# Patient Record
Sex: Female | Born: 1985 | Race: Black or African American | Hispanic: No | Marital: Single | Smoking: Former smoker
Health system: Southern US, Community
[De-identification: ages and names within clinical notes are randomized; demographics above are authoritative.]

## PROBLEM LIST (undated history)

## (undated) ENCOUNTER — Inpatient Hospital Stay (HOSPITAL_COMMUNITY): Payer: Self-pay

## (undated) DIAGNOSIS — D649 Anemia, unspecified: Secondary | ICD-10-CM

## (undated) DIAGNOSIS — F419 Anxiety disorder, unspecified: Secondary | ICD-10-CM

## (undated) DIAGNOSIS — F32A Depression, unspecified: Secondary | ICD-10-CM

## (undated) DIAGNOSIS — I608 Other nontraumatic subarachnoid hemorrhage: Secondary | ICD-10-CM

## (undated) DIAGNOSIS — F329 Major depressive disorder, single episode, unspecified: Secondary | ICD-10-CM

## (undated) HISTORY — DX: Anxiety disorder, unspecified: F41.9

---
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
## 2003-02-28 ENCOUNTER — Inpatient Hospital Stay (HOSPITAL_COMMUNITY): Admission: AD | Admit: 2003-02-28 | Discharge: 2003-02-28 | Payer: Self-pay | Admitting: Obstetrics

## 2003-03-12 ENCOUNTER — Inpatient Hospital Stay (HOSPITAL_COMMUNITY): Admission: AD | Admit: 2003-03-12 | Discharge: 2003-03-12 | Payer: Self-pay | Admitting: Obstetrics

## 2003-06-17 ENCOUNTER — Inpatient Hospital Stay (HOSPITAL_COMMUNITY): Admission: AD | Admit: 2003-06-17 | Discharge: 2003-06-17 | Payer: Self-pay | Admitting: Obstetrics

## 2003-06-30 ENCOUNTER — Inpatient Hospital Stay (HOSPITAL_COMMUNITY): Admission: AD | Admit: 2003-06-30 | Discharge: 2003-07-01 | Payer: Self-pay | Admitting: Obstetrics

## 2003-08-05 ENCOUNTER — Ambulatory Visit (HOSPITAL_COMMUNITY): Admission: RE | Admit: 2003-08-05 | Discharge: 2003-08-05 | Payer: Self-pay | Admitting: Obstetrics & Gynecology

## 2003-08-31 ENCOUNTER — Inpatient Hospital Stay (HOSPITAL_COMMUNITY): Admission: AD | Admit: 2003-08-31 | Discharge: 2003-08-31 | Payer: Self-pay | Admitting: Obstetrics

## 2003-10-20 ENCOUNTER — Inpatient Hospital Stay (HOSPITAL_COMMUNITY): Admission: AD | Admit: 2003-10-20 | Discharge: 2003-10-20 | Payer: Self-pay | Admitting: Obstetrics & Gynecology

## 2003-10-26 ENCOUNTER — Inpatient Hospital Stay (HOSPITAL_COMMUNITY): Admission: AD | Admit: 2003-10-26 | Discharge: 2003-10-30 | Payer: Self-pay | Admitting: Obstetrics

## 2003-10-27 ENCOUNTER — Encounter (INDEPENDENT_AMBULATORY_CARE_PROVIDER_SITE_OTHER): Payer: Self-pay | Admitting: *Deleted

## 2004-04-05 ENCOUNTER — Emergency Department (HOSPITAL_COMMUNITY): Admission: EM | Admit: 2004-04-05 | Discharge: 2004-04-05 | Payer: Self-pay | Admitting: Emergency Medicine

## 2004-04-10 ENCOUNTER — Emergency Department (HOSPITAL_COMMUNITY): Admission: EM | Admit: 2004-04-10 | Discharge: 2004-04-10 | Payer: Self-pay | Admitting: Family Medicine

## 2004-07-29 ENCOUNTER — Emergency Department (HOSPITAL_COMMUNITY): Admission: EM | Admit: 2004-07-29 | Discharge: 2004-07-29 | Payer: Self-pay | Admitting: Emergency Medicine

## 2004-11-03 ENCOUNTER — Emergency Department (HOSPITAL_COMMUNITY): Admission: EM | Admit: 2004-11-03 | Discharge: 2004-11-03 | Payer: Self-pay | Admitting: Family Medicine

## 2004-11-17 ENCOUNTER — Inpatient Hospital Stay (HOSPITAL_COMMUNITY): Admission: AD | Admit: 2004-11-17 | Discharge: 2004-11-18 | Payer: Self-pay | Admitting: Obstetrics

## 2005-07-22 ENCOUNTER — Emergency Department (HOSPITAL_COMMUNITY): Admission: EM | Admit: 2005-07-22 | Discharge: 2005-07-23 | Payer: Self-pay | Admitting: Emergency Medicine

## 2005-09-12 ENCOUNTER — Ambulatory Visit: Payer: Self-pay | Admitting: Family Medicine

## 2005-09-23 ENCOUNTER — Emergency Department (HOSPITAL_COMMUNITY): Admission: EM | Admit: 2005-09-23 | Discharge: 2005-09-23 | Payer: Self-pay | Admitting: Family Medicine

## 2005-11-04 ENCOUNTER — Emergency Department (HOSPITAL_COMMUNITY): Admission: EM | Admit: 2005-11-04 | Discharge: 2005-11-04 | Payer: Self-pay | Admitting: Emergency Medicine

## 2006-02-03 ENCOUNTER — Emergency Department (HOSPITAL_COMMUNITY): Admission: EM | Admit: 2006-02-03 | Discharge: 2006-02-03 | Payer: Self-pay | Admitting: Family Medicine

## 2006-06-12 ENCOUNTER — Telehealth: Payer: Self-pay | Admitting: *Deleted

## 2006-06-13 ENCOUNTER — Encounter: Payer: Self-pay | Admitting: Family Medicine

## 2006-06-13 ENCOUNTER — Ambulatory Visit: Payer: Self-pay | Admitting: Family Medicine

## 2006-06-13 DIAGNOSIS — N898 Other specified noninflammatory disorders of vagina: Secondary | ICD-10-CM | POA: Insufficient documentation

## 2006-06-13 DIAGNOSIS — F410 Panic disorder [episodic paroxysmal anxiety] without agoraphobia: Secondary | ICD-10-CM | POA: Insufficient documentation

## 2006-06-13 LAB — CONVERTED CEMR LAB
AST: 19 units/L (ref 0–37)
Alkaline Phosphatase: 95 units/L (ref 39–117)
BUN: 12 mg/dL (ref 6–23)
Creatinine, Ser: 0.61 mg/dL (ref 0.40–1.20)
Potassium: 4.1 meq/L (ref 3.5–5.3)
Whiff Test: POSITIVE

## 2006-09-18 ENCOUNTER — Emergency Department (HOSPITAL_COMMUNITY): Admission: EM | Admit: 2006-09-18 | Discharge: 2006-09-18 | Payer: Self-pay | Admitting: Emergency Medicine

## 2006-12-17 ENCOUNTER — Telehealth: Payer: Self-pay | Admitting: *Deleted

## 2006-12-18 ENCOUNTER — Ambulatory Visit: Payer: Self-pay | Admitting: Family Medicine

## 2006-12-18 DIAGNOSIS — F418 Other specified anxiety disorders: Secondary | ICD-10-CM | POA: Insufficient documentation

## 2007-07-08 ENCOUNTER — Inpatient Hospital Stay (HOSPITAL_COMMUNITY): Admission: AD | Admit: 2007-07-08 | Discharge: 2007-07-08 | Payer: Self-pay | Admitting: Obstetrics & Gynecology

## 2007-11-04 ENCOUNTER — Ambulatory Visit: Payer: Self-pay | Admitting: Family Medicine

## 2007-11-04 DIAGNOSIS — N949 Unspecified condition associated with female genital organs and menstrual cycle: Secondary | ICD-10-CM

## 2007-11-04 LAB — CONVERTED CEMR LAB: Hemoglobin: 12.7 g/dL

## 2007-11-10 ENCOUNTER — Other Ambulatory Visit: Admission: RE | Admit: 2007-11-10 | Discharge: 2007-11-10 | Payer: Self-pay | Admitting: Family Medicine

## 2007-11-10 ENCOUNTER — Encounter: Payer: Self-pay | Admitting: Family Medicine

## 2007-11-10 ENCOUNTER — Ambulatory Visit: Payer: Self-pay | Admitting: Family Medicine

## 2007-11-10 DIAGNOSIS — Z6841 Body Mass Index (BMI) 40.0 and over, adult: Secondary | ICD-10-CM | POA: Insufficient documentation

## 2007-11-10 DIAGNOSIS — E669 Obesity, unspecified: Secondary | ICD-10-CM

## 2007-11-10 LAB — CONVERTED CEMR LAB
Chlamydia, DNA Probe: NEGATIVE
Cholesterol: 152 mg/dL (ref 0–200)
GC Probe Amp, Genital: NEGATIVE
Pap Smear: NORMAL
Triglycerides: 106 mg/dL (ref <150)

## 2007-11-21 ENCOUNTER — Encounter: Payer: Self-pay | Admitting: Family Medicine

## 2007-12-10 ENCOUNTER — Encounter: Payer: Self-pay | Admitting: *Deleted

## 2007-12-31 ENCOUNTER — Telehealth: Payer: Self-pay | Admitting: *Deleted

## 2008-01-02 ENCOUNTER — Encounter (INDEPENDENT_AMBULATORY_CARE_PROVIDER_SITE_OTHER): Payer: Self-pay | Admitting: Family Medicine

## 2008-01-02 ENCOUNTER — Ambulatory Visit: Payer: Self-pay | Admitting: Family Medicine

## 2008-01-02 DIAGNOSIS — F4321 Adjustment disorder with depressed mood: Secondary | ICD-10-CM

## 2008-01-06 ENCOUNTER — Telehealth: Payer: Self-pay | Admitting: *Deleted

## 2008-01-06 LAB — CONVERTED CEMR LAB
Chlamydia, DNA Probe: NEGATIVE
GC Probe Amp, Genital: NEGATIVE

## 2008-01-23 ENCOUNTER — Encounter: Payer: Self-pay | Admitting: *Deleted

## 2008-02-05 ENCOUNTER — Telehealth (INDEPENDENT_AMBULATORY_CARE_PROVIDER_SITE_OTHER): Payer: Self-pay | Admitting: *Deleted

## 2008-02-06 ENCOUNTER — Encounter: Payer: Self-pay | Admitting: *Deleted

## 2008-02-06 ENCOUNTER — Ambulatory Visit: Payer: Self-pay | Admitting: Family Medicine

## 2008-02-06 ENCOUNTER — Encounter: Payer: Self-pay | Admitting: Family Medicine

## 2008-02-06 LAB — CONVERTED CEMR LAB: Chlamydia, DNA Probe: NEGATIVE

## 2008-02-07 ENCOUNTER — Encounter: Payer: Self-pay | Admitting: Family Medicine

## 2008-04-14 ENCOUNTER — Emergency Department (HOSPITAL_COMMUNITY): Admission: EM | Admit: 2008-04-14 | Discharge: 2008-04-14 | Payer: Self-pay | Admitting: Family Medicine

## 2008-08-16 ENCOUNTER — Encounter: Payer: Self-pay | Admitting: Family Medicine

## 2008-08-16 ENCOUNTER — Ambulatory Visit: Payer: Self-pay | Admitting: Family Medicine

## 2008-08-16 LAB — CONVERTED CEMR LAB
Beta hcg, urine, semiquantitative: NEGATIVE
Chlamydia, DNA Probe: NEGATIVE

## 2008-08-17 ENCOUNTER — Telehealth: Payer: Self-pay | Admitting: *Deleted

## 2008-08-20 ENCOUNTER — Encounter: Payer: Self-pay | Admitting: Family Medicine

## 2008-09-02 ENCOUNTER — Encounter: Payer: Self-pay | Admitting: Family Medicine

## 2008-09-02 ENCOUNTER — Ambulatory Visit: Payer: Self-pay | Admitting: Family Medicine

## 2008-09-02 DIAGNOSIS — J029 Acute pharyngitis, unspecified: Secondary | ICD-10-CM | POA: Insufficient documentation

## 2008-09-02 LAB — CONVERTED CEMR LAB: Rapid Strep: NEGATIVE

## 2008-12-04 ENCOUNTER — Encounter (INDEPENDENT_AMBULATORY_CARE_PROVIDER_SITE_OTHER): Payer: Self-pay | Admitting: *Deleted

## 2008-12-04 DIAGNOSIS — F17200 Nicotine dependence, unspecified, uncomplicated: Secondary | ICD-10-CM | POA: Insufficient documentation

## 2009-03-23 ENCOUNTER — Telehealth: Payer: Self-pay | Admitting: Family Medicine

## 2009-06-21 ENCOUNTER — Ambulatory Visit: Payer: Self-pay | Admitting: Family Medicine

## 2009-06-21 ENCOUNTER — Encounter: Payer: Self-pay | Admitting: Family Medicine

## 2009-06-21 ENCOUNTER — Other Ambulatory Visit: Admission: RE | Admit: 2009-06-21 | Discharge: 2009-06-21 | Payer: Self-pay | Admitting: Family Medicine

## 2009-06-21 LAB — CONVERTED CEMR LAB
GC Probe Amp, Genital: NEGATIVE
Pap Smear: NEGATIVE

## 2009-06-22 ENCOUNTER — Encounter: Payer: Self-pay | Admitting: Family Medicine

## 2009-08-10 ENCOUNTER — Ambulatory Visit: Payer: Self-pay | Admitting: Family Medicine

## 2009-08-10 DIAGNOSIS — N760 Acute vaginitis: Secondary | ICD-10-CM | POA: Insufficient documentation

## 2009-08-10 LAB — CONVERTED CEMR LAB: GC Probe Amp, Genital: NEGATIVE

## 2009-08-12 ENCOUNTER — Encounter: Payer: Self-pay | Admitting: Family Medicine

## 2009-09-20 ENCOUNTER — Ambulatory Visit: Payer: Self-pay | Admitting: Family Medicine

## 2009-12-16 ENCOUNTER — Ambulatory Visit: Payer: Self-pay | Admitting: Family Medicine

## 2010-01-23 ENCOUNTER — Telehealth: Payer: Self-pay | Admitting: *Deleted

## 2010-01-24 ENCOUNTER — Encounter: Payer: Self-pay | Admitting: Family Medicine

## 2010-01-24 ENCOUNTER — Ambulatory Visit: Payer: Self-pay | Admitting: Family Medicine

## 2010-01-24 LAB — CONVERTED CEMR LAB
Chlamydia, DNA Probe: NEGATIVE
GC Probe Amp, Genital: NEGATIVE
Whiff Test: NEGATIVE

## 2010-01-25 ENCOUNTER — Encounter: Payer: Self-pay | Admitting: Family Medicine

## 2010-03-13 ENCOUNTER — Ambulatory Visit: Payer: Self-pay | Admitting: Family Medicine

## 2010-04-25 NOTE — Assessment & Plan Note (Signed)
Summary: cpe/pap,df   Vital Signs:  Patient profile:   25 year old female Height:      62.34 inches Weight:      196 pounds BMI:     35.59 Temp:     98.4 degrees F oral Pulse rate:   88 / minute BP sitting:   105 / 74  (left arm) Cuff size:   regular  Vitals Entered By: Tessie Fass CMA (June 21, 2009 2:17 PM) CC: pap Is Patient Diabetic? No Pain Assessment Patient in pain? no        Primary Care Provider:  Delbert Harness MD  CC:  pap.  History of Present Illness: 25 yo here for annual gynecological exam.  LMP:  March 14th Contraception: was doing depo, but not on anything now.  Unprotected sex- March 3rd.  Regular Menses: regular Hx of Anemia: in pregnancy FHx of Breast, Uterine, Cervical or Ovarian Cancer:  No Last Pap:  years ago. Hx of Abnormal Pap:  No Desires STD testing: Yes Last Mammogram:  No Abnormalities on Self-exam:  No Hx of Abnormal Mammogram:  No  Other issues / complaints:  Cough x 2 weeks:  nonproductive, no fever, + sore throat.  No sick contacts.  Smoker.  smoking: has been a smoker for 8 years, smokes 1 ppd.  quit once before when baby was born- quit cold [place].  Relapsed but would like to try again due to cost of smoking and pressure from her 25 yo.   Habits & Providers  Alcohol-Tobacco-Diet     Tobacco Status: current     Cigarette Packs/Day: 1.0  Current Medications (verified): 1)  Depo-Provera 150 Mg/ml Im Susp (Medroxyprogesterone Acetate) .... Received First Dose On 11/04/07 2)  Metrogel-Vaginal 0.75 % Gel (Metronidazole) .... Insert One Applicator Once Daily For 5 Days (Use Before Bed)  Allergies: 1)  ! * Shellfish 2)  Sulfamethoxazole (Sulfamethoxazole) 3)  Penicillin G Potassium (Penicillin G Potassium) PMH-FH-SH reviewed-no changes except otherwise noted  Social History: Packs/Day:  1.0  Review of Systems      See HPI General:  Denies fever. ENT:  Complains of sore throat. CV:  Denies chest pain or discomfort,  lightheadness, and swelling of feet. Resp:  Complains of cough; denies shortness of breath. GI:  Denies abdominal pain. GU:  Denies abnormal vaginal bleeding and discharge.  Physical Exam  General:  Well-developed,well-nourished,in no acute distress; alert,appropriate and cooperative throughout examination Mouth:  Oral mucosa and oropharynx without lesions or exudates.  Oropharynx is erythematous but ahs no streaking. Pt has no tonsils. Teeth in good repair. Neck:  No deformities, masses, or tenderness noted. Lymphnodes are not enlarged Lungs:  Normal respiratory effort, chest expands symmetrically. Lungs are clear to auscultation, no crackles or wheezes. Heart:  Normal rate and regular rhythm. S1 and S2 normal without gallop, murmur, click, rub or other extra sounds. Genitalia:  Pelvic Exam:        External: normal female genitalia without lesions or masses        Vagina: normal without lesions or masses        Cervix: normal without lesions or masses        Adnexa: normal bimanual exam without masses or fullness        Uterus: normal by palpation        Pap smear: performed Psych:  Cognition and judgment appear intact. Alert and cooperative with normal attention span and concentration. No apparent delusions, illusions, hallucinations   Impression & Recommendations:  Problem #  1:  WELL WOMAN (ICD-V70.0)  pap smear + STD testing for high risk sexual behavior.  discussed contraception options.  Patient would like depo.  Advised multivitamin.  Discussed healthy BMI, safe sex.    Orders: [facility] - Est  18-39 yrs ([address])  Problem # 2:  TOBACCO USER (ICD-305.1)  patient ready to quit.  motivated.  Although ideally would prefer to have full appointment to discuss tobacco cessation, given patient isvery motivated today, do nto want to miss opportunity.  Discussed with patient need for full support for maximum success and recommended usuing quit line or attending class at [facility] before quit  date.  Her updated medication list for this problem includes:    Chantix Starting Month Pak 0.5 Mg X 11 & 1 Mg X 42 Tabs (Varenicline tartrate) ..... Use as directed    Chantix Continuing Month Pak 1 Mg Tabs (Varenicline tartrate) ..... Use as directed  Orders: [facility] - Est  18-39 yrs ([address])  Complete Medication List: 1)  Depo-provera 150 Mg/ml Im Susp (Medroxyprogesterone acetate) .... Received first dose on 11/04/07 2)  Metrogel-vaginal 0.75 % Gel (Metronidazole) .... Insert one applicator once daily for 5 days (use before bed) 3)  Chantix Starting Month Pak 0.5 Mg X 11 & 1 Mg X 42 Tabs (Varenicline tartrate) .... Use as directed 4)  Chantix Continuing Month Pak 1 Mg Tabs (Varenicline tartrate) .... Use as directed 5)  Fluconazole 150 Mg Tabs (Fluconazole) .... Take one tablet now  Other Orders: Pap Smear-[facility] (34742-59563) U Preg-[facility] (81025) GC/Chlamydia-[facility] (87591/87491) Wet Prep- [facility] [phone]) Pap Smear-[facility] (33295-18841) RPR-[facility] [phone]) HIV-[facility] (09323-55732) Depo-Provera 150mg  (K0254)  Patient Instructions: 1)  Start Chantix 1 week before quit date- read package insert. 2)  Use your resources- our class here at [facility] and quit line. 3)  Make follow-up in 3 months to see how you are doing with smoking. 4)  Will get depo shot today. Prescriptions: FLUCONAZOLE 150 MG TABS (FLUCONAZOLE) take one tablet now  #1 x 0   Entered and Authorized by:   Delbert Harness MD   Signed by:   Delbert Harness MD on 06/21/2009   Method used:   Print then Give to Patient   RxID:   [id number] CHANTIX CONTINUING MONTH PAK 1 MG TABS (VARENICLINE TARTRATE) use as directed  #1 x 1   Entered and Authorized by:   Delbert Harness MD   Signed by:   Delbert Harness MD on 06/21/2009   Method used:   Print then Give to Patient   RxID:   [id number] CHANTIX STARTING MONTH PAK 0.5 MG X 11 & 1 MG X 42 TABS (VARENICLINE TARTRATE) use as directed  #1 x 0   Entered and Authorized by:   Delbert Harness MD   Signed by:    Delbert Harness MD on 06/21/2009   Method used:   Handwritten   RxID:   [id number]   Laboratory Results   Urine Tests  Date/Time Received: June 21, 2009 2:54 PM  Date/Time Reported: June 21, 2009 3:16 PM     Urine HCG: negative Comments: ...........test performed by...........Marland KitchenTerese Door, CMA  Date/Time Received: June 21, 2009 2:54 PM  Date/Time Reported: June 21, 2009 3:10 PM   [employer] Source: vaginal WBC/hpf: 5-10 Bacteria/hpf: 3+  Rods Clue cells/hpf: none  Negative whiff Yeast/hpf: moderate Trichomonas/hpf: none Comments: ...........test performed by...........Marland KitchenTerese Door, CMA      Medication Administration  Injection # 1:    Medication: Depo-Provera 150mg     Diagnosis: CONTRACEPTIVE MANAGEMENT (ICD-V25.09)  Route: IM    Site: RUOQ gluteus    Exp Date: 07/2011    Lot #: [id number]    Mfr: Francisca December    Comments: next depo due 09-06-2009-09-20-2009    Patient tolerated injection without complications    Given by: Tessie Fass CMA (June 21, 2009 3:41 PM)  Orders Added: 1)  Pap Smear-[facility] [[id number]] 2)  U Preg-[facility] [81025] 3)  GC/Chlamydia-[facility] [87591/87491] 4)  Wet Prep- [facility] [87210] 5)  Pap Smear-[facility] [56213-08657] 6)  RPR-[facility] [84696-29528] 7)  HIV-[facility] [41324-40102] 8)  Depo-Provera 150mg  [J1055] 9)  [facility] - Est  18-39 yrs [99395]

## 2010-04-25 NOTE — Letter (Signed)
Summary: Results Follow-up Letter  [facility]  [address]   [place], [place] [address]   Phone: [phone]  Fax: [phone]    06/22/2009  [address] [place], [place]  [address]  Dear Ms. Kratochvil,   The following are the results of your recent test(s):  Your tests for gonorrhea, chlamydia, HIV, syphillis are all normal.  Sincerely,  Delbert Harness MD [facility]           Appended Document: Results Follow-up Letter mailed.

## 2010-04-25 NOTE — Assessment & Plan Note (Signed)
Summary: depo,df  Nurse Visit   Allergies: 1)  ! * Shellfish 2)  Sulfamethoxazole (Sulfamethoxazole) 3)  Penicillin G Potassium (Penicillin G Potassium)  Medication Administration  Injection # 1:    Medication: Depo-Provera 150mg     Diagnosis: CONTRACEPTIVE MANAGEMENT (ICD-V25.09)    Route: IM    Site: RUOQ gluteus    Exp Date: 05/2012    Lot #: [id number]    Mfr: [email or web address]    Comments: next depo due Dec 9 thru Mar 17, 2010    Patient tolerated injection without complications    Given by: Theresia Lo RN (December 16, 2009 9:30 AM)  Orders Added: 1)  Depo-Provera 150mg  [J1055] 2)  Admin of Injection (IM/SQ) [78295]   Medication Administration  Injection # 1:    Medication: Depo-Provera 150mg     Diagnosis: CONTRACEPTIVE MANAGEMENT (ICD-V25.09)    Route: IM    Site: RUOQ gluteus    Exp Date: 05/2012    Lot #: [id number]    Mfr: [email or web address]    Comments: next depo due Dec 9 thru Mar 17, 2010    Patient tolerated injection without complications    Given by: Theresia Lo RN (December 16, 2009 9:30 AM)  Orders Added: 1)  Depo-Provera 150mg  [J1055] 2)  Admin of Injection (IM/SQ) [65784]

## 2010-04-25 NOTE — Assessment & Plan Note (Signed)
Summary: prenancy test & depo,tcb  Nurse Visit  patient was actually on time for Depo and does not require upreg. Theresia Lo RN  September 20, 2009 11:16 AM   Allergies: 1)  ! * Shellfish 2)  Sulfamethoxazole (Sulfamethoxazole) 3)  Penicillin G Potassium (Penicillin G Potassium)  Medication Administration  Injection # 1:    Medication: Depo-Provera 150mg     Diagnosis: CONTRACEPTIVE MANAGEMENT (ICD-V25.09)    Route: IM    Site: LUOQ gluteus    Exp Date: 04/2012    Lot #: [id number]    Mfr: [email or web address]    Comments: next depo due Sept 13 thru Sept 27, 2011    Patient tolerated injection without complications    Given by: Theresia Lo RN (September 20, 2009 11:17 AM)  Orders Added: 1)  Depo-Provera 150mg  [J1055] 2)  Admin of Injection (IM/SQ) [63016]   Medication Administration  Injection # 1:    Medication: Depo-Provera 150mg     Diagnosis: CONTRACEPTIVE MANAGEMENT (ICD-V25.09)    Route: IM    Site: LUOQ gluteus    Exp Date: 04/2012    Lot #: [id number]    Mfr: [email or web address]    Comments: next depo due Sept 13 thru Sept 27, 2011    Patient tolerated injection without complications    Given by: Theresia Lo RN (September 20, 2009 11:17 AM)  Orders Added: 1)  Depo-Provera 150mg  [J1055] 2)  Admin of Injection (IM/SQ) [35573]

## 2010-04-25 NOTE — Assessment & Plan Note (Signed)
Summary: STD check   Vital Signs:  Patient profile:   25 year old female Weight:      186.6 pounds BMI:     33.88 Temp:     97.8 degrees F Pulse rate:   86 / minute BP sitting:   116 / 87  (left arm)  Vitals Entered By: Starleen Blue RN (Aug 10, 2009 9:54 AM) CC: ? std Is Patient Diabetic? No Pain Assessment Patient in pain? no        Primary Care Provider:  Delbert Harness MD  CC:  ? std.  History of Present Illness: 25 yo with vaginal itching, no discharge, burning on labia when urinates. Used over the counter Monistat and had relief after 3 days.  States that usally she gets yeast infections and treats them and then gets BV and needs treatment for that.  No odor.  Sexually active with one female partner - he has had other partners recently.  She would like STI testing.  On Depo.    Habits & Providers  Alcohol-Tobacco-Diet     Tobacco Status: current     Cigarette Packs/Day: 0.5  Allergies: 1)  ! * Shellfish 2)  Sulfamethoxazole (Sulfamethoxazole) 3)  Penicillin G Potassium (Penicillin G Potassium)  Social History: Packs/Day:  0.5  Physical Exam  General:  Well-developed,well-nourished,in no acute distress; alert,appropriate and cooperative throughout examination Genitalia:  Normal introitus for age, no external lesions, no vaginal discharge, mucosa pink and moist, no vaginal or cervical lesions, no vaginal atrophy, no friaility or hemorrhage, normal uterus size and position, no adnexal masses or tenderness.  No CMT.   Impression & Recommendations:  Problem # 1:  SEXUAL ACTIVITY, HIGH RISK (ICD-V69.2) Check labs today. Pt declined RPR/HIV - states she just had those recently and plans to repeat 6 months from test.   Orders: GC/Chlamydia-[facility] (87591/87491) [facility]  Problem # 2:  VAGINITIS (ICD-616.10) Yeast symptoms have improved with rx, and no evidence on wet prep of yeast or BV. No further rx today.  await gc/cz testing.   Her updated medication  list for this problem includes:    Metrogel-vaginal 0.75 % Gel (Metronidazole) ..... Insert one applicator once daily for 5 days (use before bed)  Complete Medication List: 1)  Depo-provera 150 Mg/ml Im Susp (Medroxyprogesterone acetate) .... Received first dose on 11/04/07 2)  Metrogel-vaginal 0.75 % Gel (Metronidazole) .... Insert one applicator once daily for 5 days (use before bed) 3)  Chantix Starting Month Pak 0.5 Mg X 11 & 1 Mg X 42 Tabs (Varenicline tartrate) .... Use as directed 4)  Chantix Continuing Month Pak 1 Mg Tabs (Varenicline tartrate) .... Use as directed 5)  Fluconazole 150 Mg Tabs (Fluconazole) .... Take one tablet now  Laboratory Results  Date/Time Received: Aug 10, 2009 10:23 AM  Date/Time Reported: Aug 10, 2009 10:36 AM   [employer] Source: vag WBC/hpf: 15-25 Bacteria/hpf: 3+  Rods Clue cells/hpf: none  Negative whiff Yeast/hpf: none Trichomonas/hpf: none Comments: ...............test performed by......Marland KitchenBonnie A. [place], MLS (ASCP)cm

## 2010-04-25 NOTE — Assessment & Plan Note (Signed)
Summary: ? BV/kf   Vital Signs:  Patient profile:   25 year old female Weight:      191.8 pounds Pulse rate:   80 / minute BP sitting:   120 / 76  (right arm)  Vitals Entered By: Arlyss Repress CMA, (January 24, 2010 8:36 AM) CC: vaginal discharge Is Patient Diabetic? No Pain Assessment Patient in pain? no        Primary Care Provider:  Delbert Harness MD  CC:  vaginal discharge.  History of Present Illness: 25 yo F:  1. Vaginal Discharge: x 3-7 days, white, itchy, with odor. + sexually active with one partner for 2 years but partner not faithful. + Chlamydia in the past. Routine STD testing. No dysuria, abdominal pain, fever/chills, N/V/D, abnormal bleeding. Hx of frequent BV.  Habits & Providers  Alcohol-Tobacco-Diet     Tobacco Status: current     Tobacco Counseling: to quit use of tobacco products  Current Medications (verified): 1)  Depo-Provera 150 Mg/ml Im Susp (Medroxyprogesterone Acetate) .... Received First Dose On 11/04/07 2)  Chantix Starting Month Pak 0.5 Mg X 11 & 1 Mg X 42 Tabs (Varenicline Tartrate) .... Use As Directed 3)  Chantix Continuing Month Pak 1 Mg Tabs (Varenicline Tartrate) .... Use As Directed  Allergies (verified): 1)  ! * Shellfish 2)  Sulfamethoxazole (Sulfamethoxazole) 3)  Penicillin G Potassium (Penicillin G Potassium) PMH-FH-SH reviewed for relevance  Review of Systems      See HPI  Physical Exam  General:  Well-developed, well-nourished, in no acute distress; alert, appropriate and cooperative throughout examination. Vitals reviewed. Abdomen:  Bowel sounds positive,abdomen soft and non-tender without masses, organomegaly or hernias noted. Genitalia:  Pelvic Exam:        External: normal female genitalia without lesions or masses        Vagina: normal without lesions or masses, small amount white discharge, samples taken        Cervix: normal without lesions or masses        Adnexa: normal bimanual exam without masses or fullness       Uterus: normal by palpation        Pap smear: not performed   Impression & Recommendations:  Problem # 1:  VAGINAL DISCHARGE (ICD-623.5) Assessment New  No Red Flags. Wet prep, GC/Ch samples taken. Wet prep negative. Discussed proper hygeine. Awaiting GC/Chl.  Orders: [facility]- Est Level  3 (99213)  Complete Medication List: 1)  Depo-provera 150 Mg/ml Im Susp (Medroxyprogesterone acetate) .... Received first dose on 11/04/07 2)  Chantix Starting Month Pak 0.5 Mg X 11 & 1 Mg X 42 Tabs (Varenicline tartrate) .... Use as directed 3)  Chantix Continuing Month Pak 1 Mg Tabs (Varenicline tartrate) .... Use as directed  [facility]: [id number] GC/Chlamydia-[facility] (87591/87491)  Patient Instructions: 1)  It was nice to meet you today!   Orders Added: 1)  Wet Prep- [facility] [87210] 2)  GC/Chlamydia-[facility] [87591/87491] 3)  [facility]- Est Level  3 [98119]    Prevention & Chronic Care Immunizations   Influenza vaccine: Not documented    Tetanus booster: 09/12/2005: given   Tetanus booster due: 09/13/2015    Pneumococcal vaccine: Not documented  Other Screening   Pap smear: NEGATIVE FOR INTRAEPITHELIAL LESIONS OR MALIGNANCY.  (06/21/2009)   Pap smear due: 11/09/2008   Smoking status: current  (01/24/2010)   Smoking cessation counseling: yes  (02/06/2008)   Laboratory Results  Date/Time Received: January 24, 2010 8:49 AM  Date/Time Reported:  January 24, 2010 8:52 AM   [employer] Source: vaginal WBC/hpf: 1-5 Bacteria/hpf: 3+  Rods Clue cells/hpf: none  Negative whiff Yeast/hpf: none Trichomonas/hpf: none Comments: ...........test performed by...........Marland KitchenTerese Door, CMA

## 2010-04-25 NOTE — Progress Notes (Signed)
Summary: triage  Phone Note Call from Patient Call back at [phone]   Caller: Patient Summary of Call: has BV and has a new job that can't get off work.  wants to know if she can have something called in. Initial call taken by: De Nurse,  January 23, 2010 8:38 AM  Follow-up for Phone Call        Has odor but no d/c.  No itching.  Has been treated for bv before and is pretty sure that's what she has.  Told her she would need to be seen in order to be treated.  Job stats at 9 am so told her to be here tomorrow at 8:20 and we would see her first thing.  Pt agreeable. Follow-up by: Dennison Nancy RN,  January 23, 2010 9:15 AM

## 2010-04-25 NOTE — Letter (Signed)
Summary: Generic Letter  [facility]  [address]   [place], [place] [address]   Phone: [phone]  Fax: [phone]    01/25/2010  Kerry Chavez [address] [place], [place]  [address]  Dear [name],  I am happy to tell you that your labs were all normal.  Sincerely,   Helane Rima DO  Appended Document: Generic Letter mailed

## 2010-04-25 NOTE — Letter (Signed)
Summary: Generic Letter  [facility]  [address]   [place], [place] [address]   Phone: [phone]  Fax: [phone]    08/12/2009  [name] [address] [place], [place]  [address]  Dear Kerry Chavez,  I am happy to let you know that your tests that we did this week were all negative.  There is no sign of infection.  Please let us know if you have any questions.   Sincerely,   Erynne Kealey [place] MD  Appended Document: Generic Letter mailed.  Appended Document: Generic Letter mailed.

## 2010-04-27 NOTE — Assessment & Plan Note (Signed)
Summary: depo,df  Nurse Visit   Allergies: 1)  ! * Shellfish 2)  Sulfamethoxazole (Sulfamethoxazole) 3)  Penicillin G Potassium (Penicillin G Potassium)  Medication Administration  Injection # 1:    Medication: Depo-Provera 150mg     Diagnosis: CONTRACEPTIVE MANAGEMENT (ICD-V25.09)    Route: IM    Site: RUOQ gluteus    Exp Date: 07/2012    Lot #: [id number]    Mfr: [email or web address]    Comments: next depo  due March 6 thru June 12, 2009    Patient tolerated injection without complications    Given by: Theresia Lo RN (March 13, 2010 8:43 AM)  Orders Added: 1)  Depo-Provera 150mg  [J1055] 2)  Admin of Injection (IM/SQ) [04540]   Medication Administration  Injection # 1:    Medication: Depo-Provera 150mg     Diagnosis: CONTRACEPTIVE MANAGEMENT (ICD-V25.09)    Route: IM    Site: RUOQ gluteus    Exp Date: 07/2012    Lot #: [id number]    Mfr: [email or web address]    Comments: next depo  due March 6 thru June 12, 2009    Patient tolerated injection without complications    Given by: Theresia Lo RN (March 13, 2010 8:43 AM)  Orders Added: 1)  Depo-Provera 150mg  [J1055] 2)  Admin of Injection (IM/SQ) [47829]

## 2010-05-12 ENCOUNTER — Encounter: Payer: Self-pay | Admitting: *Deleted

## 2010-06-22 ENCOUNTER — Encounter: Payer: Self-pay | Admitting: Family Medicine

## 2010-07-04 ENCOUNTER — Encounter (INDEPENDENT_AMBULATORY_CARE_PROVIDER_SITE_OTHER): Payer: Medicaid Other | Admitting: Family Medicine

## 2010-07-04 ENCOUNTER — Other Ambulatory Visit (HOSPITAL_COMMUNITY)
Admission: RE | Admit: 2010-07-04 | Discharge: 2010-07-04 | Disposition: A | Discharge status: Home / Self Care | Payer: Medicaid Other | Source: Ambulatory Visit | Attending: Family Medicine | Admitting: Family Medicine

## 2010-07-04 ENCOUNTER — Ambulatory Visit (INDEPENDENT_AMBULATORY_CARE_PROVIDER_SITE_OTHER): Payer: Medicaid Other | Admitting: Family Medicine

## 2010-07-04 ENCOUNTER — Encounter: Payer: Self-pay | Admitting: Family Medicine

## 2010-07-04 DIAGNOSIS — N760 Acute vaginitis: Secondary | ICD-10-CM

## 2010-07-04 DIAGNOSIS — Z124 Encounter for screening for malignant neoplasm of cervix: Secondary | ICD-10-CM

## 2010-07-04 DIAGNOSIS — Z01419 Encounter for gynecological examination (general) (routine) without abnormal findings: Secondary | ICD-10-CM | POA: Insufficient documentation

## 2010-07-04 DIAGNOSIS — R42 Dizziness and giddiness: Secondary | ICD-10-CM

## 2010-07-04 DIAGNOSIS — Z309 Encounter for contraceptive management, unspecified: Secondary | ICD-10-CM | POA: Insufficient documentation

## 2010-07-04 DIAGNOSIS — N912 Amenorrhea, unspecified: Secondary | ICD-10-CM

## 2010-07-04 LAB — COMPREHENSIVE METABOLIC PANEL
BUN: 11 mg/dL (ref 6–23)
CO2: 21 mEq/L (ref 19–32)
Calcium: 8.9 mg/dL (ref 8.4–10.5)
Chloride: 110 mEq/L (ref 96–112)
Creat: 0.67 mg/dL (ref 0.40–1.20)
Glucose, Bld: 80 mg/dL (ref 70–99)
Total Bilirubin: 0.3 mg/dL (ref 0.3–1.2)

## 2010-07-04 LAB — POCT WET PREP (WET MOUNT): Trichomonas Wet Prep HPF POC: NEGATIVE

## 2010-07-04 LAB — RPR

## 2010-07-04 MED ORDER — MEDROXYPROGESTERONE ACETATE 150 MG/ML IM SUSP
150.0000 mg | Freq: Once | INTRAMUSCULAR | Status: AC
  Administered 2010-07-04: 150 mg via INTRAMUSCULAR

## 2010-07-04 NOTE — Progress Notes (Signed)
  Subjective:    Patient ID: Kerry Chavez, female    DOB: 1985/10/14, 25 y.o.   MRN: [id number]  HPI   here for Pap smear and discussion of contraception. She is not sure she wants to stay on the Depo-Provera shot given that she's had no problems with it. She has some questions. On the shots he doesn't really have menses at all. She has not had any excessive weight gain that she is aware of secondary to the shot. She does not want to think about the IUD. She is monogamous.   Also complained of some dizzy episodes over the last 2 weeks. Come on intermittently throughout the day. That last for a couple of minutes. Sometimes he has some spots in front of her I.'s. She has no nausea no chest pain no shortness of breath and no sweating with these episodes.  Review of Systems  Constitutional: Negative for fever, chills, activity change, appetite change and fatigue.  Eyes: Negative for visual disturbance.  Respiratory: Negative for cough.   Cardiovascular: Negative for chest pain, palpitations and leg swelling.  Gastrointestinal: Negative for abdominal pain.  Genitourinary: Negative for difficulty urinating and menstrual problem.  Musculoskeletal: Negative for joint swelling and arthralgias.  Neurological: Positive for dizziness.  Psychiatric/Behavioral: Negative for confusion.       Objective:   Physical Exam  Constitutional: She appears well-developed and well-nourished.  Eyes: Conjunctivae and EOM are normal.  Cardiovascular: Normal rate, regular rhythm and normal heart sounds.   Pulmonary/Chest: Effort normal.  Abdominal: Soft.  Genitourinary: Vagina normal and uterus normal.          Assessment & Plan:   #1 contraception we discussed options she seems very undecided. He decided to give her another Depo-Provera shot today contemplate the options. She is right at the borderline for the birth control patch.  #2 STD screening today at her request.  #3 dizziness. That is very brief  and episodic. We'll check hemoglobin and see metastases. Reviewed her blood pressures and they are normal. She will keep track of the episodes and if they continue will followup

## 2010-07-05 LAB — CBC
HCT: 38.4 % (ref 36.0–46.0)
Hemoglobin: 12.8 g/dL (ref 12.0–15.0)
MCH: 31.8 pg (ref 26.0–34.0)
MCHC: 33.3 g/dL (ref 30.0–36.0)
MCV: 95.3 fL (ref 78.0–100.0)
Platelets: 224 10*3/uL (ref 150–400)
RBC: 4.03 MIL/uL (ref 3.87–5.11)
RDW: 14.1 % (ref 11.5–15.5)
WBC: 12.4 10*3/uL — ABNORMAL HIGH (ref 4.0–10.5)

## 2010-07-05 LAB — HIV ANTIBODY (ROUTINE TESTING W REFLEX): HIV: NONREACTIVE

## 2010-07-06 ENCOUNTER — Telehealth: Payer: Self-pay | Admitting: Family Medicine

## 2010-07-06 ENCOUNTER — Encounter: Payer: Self-pay | Admitting: Family Medicine

## 2010-07-06 MED ORDER — METRONIDAZOLE 500 MG PO TABS: 500.0000 mg | ORAL_TABLET | Freq: Two times a day (BID) | ORAL | Status: AC

## 2010-07-06 NOTE — Telephone Encounter (Signed)
Please call patient back regarding results of labs.

## 2010-07-06 NOTE — Telephone Encounter (Signed)
Please tell her that she DOES have some bacterial vaginosis--I have sent a Rx for flagyl. Her PAP and cervical cultures were normal THANKS! Denny Levy

## 2010-07-06 NOTE — Telephone Encounter (Signed)
Dr. Jennette Kettle Patient is wanting to know the results of her bloodwork

## 2010-07-06 NOTE — Telephone Encounter (Addendum)
Pt is wanting to know results of labs- read patient the letter that Dr Jennette Kettle sent,

## 2010-07-06 NOTE — Progress Notes (Signed)
This encounter was created in error - please disregard.

## 2010-07-10 ENCOUNTER — Telehealth: Payer: Self-pay | Admitting: *Deleted

## 2010-07-10 NOTE — Telephone Encounter (Signed)
Pt would like results

## 2010-07-11 NOTE — Telephone Encounter (Signed)
lvm for pt to return call concerning results.Kerry Chavez

## 2010-07-11 NOTE — Telephone Encounter (Signed)
I though we had already told her but maybe not--please tell her ALL of her labs (blood work) including CMP, CBC (hemoglobin)  HIV and test for syphillis were ALL NORMAL. Her wet prep did not show any trichomonas---she did have some BV and I had sent a rx in for flagyl. Does she have a specific question we have not answered? At her ov I told her if all of  The labs were normal and she continued having dizzy asspells she needed to make a f/u appt w her PCP (Dr Chryl Heck that is her question. Let me know THANKS! Denny Levy

## 2010-07-12 ENCOUNTER — Telehealth: Payer: Self-pay | Admitting: *Deleted

## 2010-07-12 NOTE — Telephone Encounter (Signed)
Pt returned call and results given.Kerry Chavez

## 2010-08-11 NOTE — Discharge Summary (Signed)
NAME:  Kerry Chavez, Kerry Chavez                        ACCOUNT NO.:  [id number]   MEDICAL RECORD NO.:  [id number]                   PATIENT TYPE:  INP   LOCATION:  9143                                 FACILITY:  [facility]   PHYSICIAN:  Roseanna Rainbow, M.D.         DATE OF BIRTH:  Feb 17, 1986   DATE OF ADMISSION:  10/26/2003  DATE OF DISCHARGE:  10/30/2003                                 DISCHARGE SUMMARY   CHIEF COMPLAINT:  The patient is a 25 year old gravida 1, para 0 with an  intrauterine pregnancy at 39-5/7 weeks, estimated date of confinement,  October 28, 2003, who complains of contractions, possible leakage of fluid.   HISTORY OF PRESENT ILLNESS:  The patient has had loss of fluid, possibly  several hours prior to presentation.  She complained of lower abdominal  cramping.  She reported good fetal movement, and she denied any vaginal  bleeding.  Prenatal course was care with [facility] with onset of care at eight  weeks.  Pregnancy complications and risks are adolescence, obesity, and  group beta strep positive.   MEDICATIONS:  Prenatal vitamins.   ALLERGIES:  PENICILLIN and SULFA.   PAST OBSTETRICAL/GYNECOLOGIC HISTORY:  Noncontributory.   PAST MEDICAL/SURGICAL HISTORY:  She denies past surgical or medical history.   FAMILY AND SOCIAL HISTORY:  She is single.  No tobacco, ethanol or substance  abuse.   LABORATORY DATA:  Prenatal lab results:  Blood type O positive, antibody  screen negative.  Hemoglobin 12.1, platelets 244,000.  Rubella immune.  Hepatitis B surface antigen negative.  Syphilis nonreactive.  HIV  nonreactive.  GC negative  Chlamydia negative.  Group beta strep positive on  October 24, 2003.  Resistant to clindamycin and erythromycin.   PHYSICAL EXAMINATION:  VITAL SIGNS:  Stable vital signs, afebrile.  GENERAL:  Alert, appropriate.  HEART:  Regular rate and rhythm.  No murmurs.  LUNGS:  Clear to auscultation bilaterally.  ABDOMEN:  Gravid, nontender.  EXTREMITIES:   Nontender.  NEUROLOGIC:  Nonfocal.  PELVIC:  Speculum exam - no pooling.  Nitrazine and fern negative.  Digital  cervical exam 1-2, 50%, vertex and -2.  Fetal heart tracing 130-140s,  average long-term variability.  Uterine contractions every 15-20 minutes.   ASSESSMENT:  Term intrauterine pregnancy.  Fetal heart tracing reassuring.   PLAN:  Admission and induction of labor.  Vancomycin for GBS prophylaxis.  Cervidil.   HOSPITAL COURSE:  The patient was admitted.  Cervidil was placed.  Vancomycin was started for GBS prophylaxis.  She progressed in labor.  However, there was arrest of dilatation with no further dilatation beyond 6  cm.  At this point, she was brought for cesarean delivery.  Please see the  dictated operative summary for further details.  Her postoperative course  was uneventful.  She received Depo-Provera prior to discharge for  contraception.   DISCHARGE DIAGNOSES:  1. Intrauterine pregnancy at term.  2. Arrest of dilatation active phase of labor.  PROCEDURE:  Cesarean delivery.   DISCHARGE CONDITION:  Stable.   DIET:  Regular.   ACTIVITY:  No strenuous activity.  Pelvic rest.   DISCHARGE MEDICATIONS:  1. Percocet.  2. Ibuprofen.  3. Prenatal vitamins.   DISPOSITION:  The patient was to follow up in the office in one week.                                               Roseanna Rainbow, M.D.    Judee Clara  D:  10/30/2003  T:  11/01/2003  Job:  [id number]

## 2010-08-11 NOTE — Op Note (Signed)
NAME:  Kerry Chavez, Kerry Chavez                     ACCOUNT NO.:  [id number]   MEDICAL RECORD NO.:  [id number]                   PATIENT TYPE:  INP   LOCATION:  9143                                 [facility]ACILITY:  [facility]   PHYSICIAN:  Kathreen Cosier, M.D.           DATE OF BIRTH:  1985/12/29   DATE OF PROCEDURE:  10/27/2003  DATE OF DISCHARGE:                                 OPERATIVE REPORT   PREOPERATIVE DIAGNOSIS:  Failure to progress in labor.   POSTOPERATIVE DIAGNOSIS:  Failure to progress in labor.   OPERATION PERFORMED:   SURGEON:  Kathreen Cosier, M.D.   ASSISTANT:  Marlinda Mike, C.N.M.   DESCRIPTION OF PROCEDURE:  Patient placed on the operating table in the  supine position.  Abdomen prepped and draped.  Bladder emptied with the  Foley catheter.  A transverse suprapubic incision made and carried down to  the rectus fascia.  The fascia cleaned and incised the length of the  incision.  Rectus muscles were retracted laterally.  Peritoneum incised  longitudinally.  A transverse incision was made in the visceral peritoneum  above the bladder.  Bladder immobilized inferiorly.  A transverse lower  uterine incision made.  Patient delivered from the OP position of a female,  Apgar 8 and 9 weighing 6 pound 1 ounce. The team was in attendance.  The  fluid was clear.  The placenta was posterior, removed manually and the  uterine cavity cleaned with dry laps.  Uterine incision closed in one layer  with continuous suture of #1 chromic.  Hemostasis was satisfactory.  Bladder  flap reattached with 2-0 chromic.  Uterus well contracted, tubes and ovaries  were normal.  Abdomen closed in layers, peritoneum continuous with 0  chromic, fascia continuous with 2-0 Dexon.  Subcutaneous tissue closed with  3-0 plain and skin closed with subcuticular stitch of 4-0 Monocryl.  Blood  loss 500 mL.                                               Kathreen Cosier, M.D.    BAM/MEDQ  D:  10/27/2003   T:  10/28/2003  Job:  [id number]

## 2010-09-06 ENCOUNTER — Encounter: Payer: Self-pay | Admitting: Sports Medicine

## 2010-09-06 ENCOUNTER — Ambulatory Visit (INDEPENDENT_AMBULATORY_CARE_PROVIDER_SITE_OTHER): Payer: Medicaid Other | Admitting: Sports Medicine

## 2010-09-06 VITALS — BP 111/79 | HR 88 | Ht 62.0 in | Wt 203.2 lb

## 2010-09-06 DIAGNOSIS — F329 Major depressive disorder, single episode, unspecified: Secondary | ICD-10-CM

## 2010-09-06 DIAGNOSIS — F3289 Other specified depressive episodes: Secondary | ICD-10-CM

## 2010-09-06 MED ORDER — CITALOPRAM HYDROBROMIDE 20 MG PO TABS: 20.0000 mg | ORAL_TABLET | Freq: Every day | ORAL | Status: DC

## 2010-09-06 NOTE — Patient Instructions (Signed)
Great to meet you, We started a new medication. Please bear with it for at least the first 6 weeks. Make an appt to come back to see me in 1-2 weeks to see how you are doing. Will also give you the number for someone to call to talk to (therapist).  Ihor Austin. Benjamin Stain, M.D. [facility] [address] [place] [address] [phone]

## 2010-09-06 NOTE — Progress Notes (Signed)
  Subjective:    Patient ID: Kerry Chavez, female    DOB: 05-04-1985, 25 y.o.   MRN: [id number]  HPI Depression:  Present for several months.  Feels down, very irritable, works all day, takes care of her children, then wants to sleep when she gets home.  Snaps easily.  No SI as she would not want to leave her daughter without a mother.  Otherwise she does have decreased interest, mood, sleep problems, lack of energy, poor appetite, trouble concentrating, psychomotor retardation.  Has had bad experiences with Buspar and Zoloft in the past.   Review of Systems See HPI    Objective:   Physical Exam  Constitutional: She appears well-developed and well-nourished. No distress.  Skin: Skin is warm and dry.  Psychiatric: Her speech is normal and behavior is normal. Thought content normal. She exhibits a depressed mood.       Tearful when discussing mood.       Assessment & Plan:

## 2010-09-06 NOTE — Assessment & Plan Note (Signed)
Will start celexa 20. Discussed mechanism of action and natural history of depression treatment. Will give the number for psychotherapy. RTC 1-2 weeks to reassess.

## 2010-09-07 ENCOUNTER — Ambulatory Visit: Payer: Medicaid Other | Admitting: Family Medicine

## 2010-09-15 ENCOUNTER — Telehealth: Payer: Self-pay | Admitting: Family Medicine

## 2010-09-15 NOTE — Telephone Encounter (Signed)
Would like someone to call back with what they need to bring for the appt. Next Tuesday.

## 2010-09-18 NOTE — Telephone Encounter (Signed)
Kim, I didn't see where patient was needing to bring anything this next visit. Am I correct? ---[place] Dec

## 2010-09-19 ENCOUNTER — Encounter: Payer: Self-pay | Admitting: Sports Medicine

## 2010-09-19 ENCOUNTER — Ambulatory Visit (INDEPENDENT_AMBULATORY_CARE_PROVIDER_SITE_OTHER): Payer: Medicaid Other | Admitting: Sports Medicine

## 2010-09-19 ENCOUNTER — Telehealth: Payer: Self-pay | Admitting: Family Medicine

## 2010-09-19 VITALS — BP 110/79 | HR 84 | Temp 98.5°F | Wt 198.6 lb

## 2010-09-19 DIAGNOSIS — F341 Dysthymic disorder: Secondary | ICD-10-CM

## 2010-09-19 DIAGNOSIS — F418 Other specified anxiety disorders: Secondary | ICD-10-CM

## 2010-09-19 NOTE — Assessment & Plan Note (Signed)
MUCH better, suspect large placebo effect. She will cont medications and RTC 1 month to reassess. PHQ9s at every visit.

## 2010-09-19 NOTE — Telephone Encounter (Signed)
You are correct- the chart does not indicate she needs anything more than a follow-up visit..  It does not appear I have seen patient.  This question is probably most appropriately sent to the last person to see her in the office.   Thanks!

## 2010-09-19 NOTE — Telephone Encounter (Signed)
LM for patient to call back.

## 2010-09-19 NOTE — Progress Notes (Signed)
  Subjective:    Patient ID: Kerry Chavez, female    DOB: 1985/10/20, 25 y.o.   MRN: [id number]  HPI Depression:  Feels SO much better, enjoys things more, enjoys time with her child, has more energy, feels like a difft person.  Feels better with life overall.  No side effects.  Review of Systems    See HPI Objective:   Physical Exam  Constitutional: She appears well-developed and well-nourished.  Cardiovascular: Normal rate, regular rhythm and normal heart sounds.  Exam reveals no gallop and no friction rub.   No murmur heard. Pulmonary/Chest: Effort normal. No respiratory distress. She has no wheezes. She has no rales.  Skin: Skin is warm and dry.  Psychiatric:       Happy appearing, smiling.          Assessment & Plan:

## 2010-09-19 NOTE — Telephone Encounter (Signed)
Spoke with patient and informed her that I didn't see where anyone called her

## 2010-09-19 NOTE — Telephone Encounter (Signed)
Pt was told someone from [facility] called her after she had left office for visit.  Please return her call if there is anything she need to be informed of.

## 2010-10-03 ENCOUNTER — Other Ambulatory Visit: Payer: Self-pay | Admitting: Sports Medicine

## 2010-10-03 NOTE — Telephone Encounter (Signed)
Refill request

## 2010-12-19 LAB — GC/CHLAMYDIA PROBE AMP, GENITAL: GC Probe Amp, Genital: NEGATIVE

## 2010-12-19 LAB — CBC
Hemoglobin: 13.7
MCHC: 34.5
RDW: 13.8

## 2010-12-19 LAB — POCT PREGNANCY, URINE
Operator id: 25114
Preg Test, Ur: NEGATIVE

## 2010-12-21 ENCOUNTER — Other Ambulatory Visit: Payer: Self-pay | Admitting: Sports Medicine

## 2010-12-21 ENCOUNTER — Other Ambulatory Visit: Payer: Self-pay | Admitting: Family Medicine

## 2010-12-21 NOTE — Telephone Encounter (Signed)
Refill request

## 2011-03-01 ENCOUNTER — Encounter (HOSPITAL_COMMUNITY): Payer: Self-pay | Admitting: *Deleted

## 2011-03-01 ENCOUNTER — Encounter: Payer: Self-pay | Admitting: Internal Medicine

## 2011-03-01 ENCOUNTER — Emergency Department (HOSPITAL_COMMUNITY)
Admission: EM | Admit: 2011-03-01 | Discharge: 2011-03-01 | Disposition: A | Discharge status: Home / Self Care | Payer: Medicaid Other | Attending: Emergency Medicine | Admitting: Emergency Medicine

## 2011-03-01 DIAGNOSIS — R0989 Other specified symptoms and signs involving the circulatory and respiratory systems: Secondary | ICD-10-CM | POA: Insufficient documentation

## 2011-03-01 DIAGNOSIS — F3289 Other specified depressive episodes: Secondary | ICD-10-CM | POA: Insufficient documentation

## 2011-03-01 DIAGNOSIS — R221 Localized swelling, mass and lump, neck: Secondary | ICD-10-CM

## 2011-03-01 DIAGNOSIS — T7840XA Allergy, unspecified, initial encounter: Secondary | ICD-10-CM | POA: Insufficient documentation

## 2011-03-01 DIAGNOSIS — K1379 Other lesions of oral mucosa: Secondary | ICD-10-CM

## 2011-03-01 DIAGNOSIS — K1370 Unspecified lesions of oral mucosa: Secondary | ICD-10-CM | POA: Insufficient documentation

## 2011-03-01 DIAGNOSIS — I10 Essential (primary) hypertension: Secondary | ICD-10-CM | POA: Insufficient documentation

## 2011-03-01 DIAGNOSIS — J45909 Unspecified asthma, uncomplicated: Secondary | ICD-10-CM | POA: Insufficient documentation

## 2011-03-01 DIAGNOSIS — Z79899 Other long term (current) drug therapy: Secondary | ICD-10-CM | POA: Insufficient documentation

## 2011-03-01 DIAGNOSIS — F329 Major depressive disorder, single episode, unspecified: Secondary | ICD-10-CM | POA: Insufficient documentation

## 2011-03-01 DIAGNOSIS — R220 Localized swelling, mass and lump, head: Secondary | ICD-10-CM | POA: Insufficient documentation

## 2011-03-01 HISTORY — DX: Depression, unspecified: F32.A

## 2011-03-01 HISTORY — DX: Major depressive disorder, single episode, unspecified: F32.9

## 2011-03-01 MED ORDER — METHYLPREDNISOLONE SODIUM SUCC 125 MG IJ SOLR
125.0000 mg | Freq: Once | INTRAMUSCULAR | Status: AC
  Administered 2011-03-01: 125 mg via INTRAVENOUS
  Filled 2011-03-01: qty 2

## 2011-03-01 MED ORDER — EPINEPHRINE 0.3 MG/0.3ML IJ DEVI
0.3000 mg | Freq: Once | INTRAMUSCULAR | Status: AC
  Administered 2011-03-01: 0.3 mg via INTRAMUSCULAR
  Filled 2011-03-01: qty 0.3

## 2011-03-01 MED ORDER — DIPHENHYDRAMINE HCL 50 MG/ML IJ SOLN
25.0000 mg | Freq: Once | INTRAMUSCULAR | Status: AC
  Administered 2011-03-01: 25 mg via INTRAVENOUS
  Filled 2011-03-01: qty 1

## 2011-03-01 MED ORDER — FAMOTIDINE IN NACL 20-0.9 MG/50ML-% IV SOLN
20.0000 mg | Freq: Once | INTRAVENOUS | Status: AC
Start: 2011-03-01 — End: 2011-03-01
  Administered 2011-03-01: 20 mg via INTRAVENOUS
  Filled 2011-03-01: qty 50

## 2011-03-01 MED ORDER — EPINEPHRINE 0.3 MG/0.3ML IJ DEVI: 0.3000 mg | Freq: Once | INTRAMUSCULAR | Status: DC

## 2011-03-01 NOTE — ED Provider Notes (Signed)
History     CSN: [id number] Arrival date & time: 03/01/2011  6:48 AM   None     Chief Complaint  Patient presents with  . Facial Swelling    (Consider location/radiation/quality/duration/timing/severity/associated sxs/prior treatment) HPI 25 y/o W with history of shellfish allergy and asthma presents with throat tightness and tongue swelling first noticed when she woke up this morning at 5:30am.  She is not having trouble breathing.  She feels like a ball is in her throat, and she has tingling on the back of her tongue.  She did not notice any symptoms last night before going to bed, and 5:30am is the usual time she wakes up.  She took a benadryl this morning.  In the past when she has had allergic reactions, benadryl has resolved her symptoms pretty quickly, but her symptoms did not change after taking benadryl today. They are currently about the same as when she woke up, not worse or better.  She denies any rash or skin itching.    Everything she can remember eating yesterday are things she had eaten before and tolerated well.  No shellfish.  She had not used any new bath products or toothpaste.  Her one prescription med, Celexa, is the same bottle she has had for a while.    She says this would be the worst allergic reaction she has had.    Past Medical History: Asthma Depression (doing well, treated with Celexa)  Past Surgical History  Procedure Date  . Tonsillectomy and adenoidectomy 1998    Family History Family history of asthma No family history of allergic reactions or autoimmune diseases   Social History Has smoked 1ppd for three years Drinks alcohol very occasionally Denies drug use (but this was asked with family in the room)   Review of Systems No dysuria, hematuria, constip, diarrhea, CP, SOB, or abdominal pain.   Allergies  Shellfish allergy; Penicillins; and Sulfamethoxazole  Home Medications   Current Outpatient Rx  Name Route Sig Dispense  Refill  . CITALOPRAM HYDROBROMIDE 20 MG PO TABS  TAKE 1 TABLET BY MOUTH DAILY 90 tablet 0    **Patient requests 90 day supply**  . MEDROXYPROGESTERONE ACETATE 150 MG/ML IM SUSP Intramuscular Inject 150 mg into the muscle every 3 (three) months. Received 1st dose 08.11.2009        Pulse 87  Temp(Src) 98.6 F (37 C) (Oral)  Resp 20  SpO2 99%  Physical Exam  General: alert, well-developed, and cooperative to examination.  Head: normocephalic and atraumatic.  Eyes: vision grossly intact, pupils equal, pupils round, pupils reactive to light, no injection and anicteric.   Mouth: Patient has erythema and swelling of uvula.  Pharynx erythematous and boggy looking.  Swelling is not severe, no evidence of airway compromise at this time. No definite lip or face swelling.  Neck: no JVD, and no carotid bruits.  Lungs: normal respiratory effort, no accessory muscle use, normal breath sounds, no crackles, and no wheezes.  Some mildly increased upper airway noise. Heart: normal rate, regular rhythm, no murmur, no gallop, and no rub.  Neurologic: alert & oriented X3, cranial nerves II-XII intact. Skin: No rashes present on face, chest, upper extremities.    ED Course  Procedures (including critical care time)  IV Benadryl, Solu-Medrol, and Pepcid given IM Epinephrine given  One hour after above medicines given, patient feeling notably better.  Her sensation of throat and tongue swelling is resolving.  Mild improvement on throat exam.  Breathing well.  Not  tachycardic.  Diagnosis: Allergic Reaction   MDM   I discussed all aspects of this case with my attending Dr. Ignacia Palma, who personally examined the patient and agreed with the plan of care.    Plan is to discharge patient with prescription for epipen should this happen again.  She should follow-up with her PCP.       Blanca Friend, MD 03/01/11 [phone]

## 2011-03-01 NOTE — Progress Notes (Signed)
Patient history 25 year old woman with a history of shellfish allergy who has developed swelling in her throat today not relieved by Benadryl exam shows her to have mild swelling in the area for the refill of. The airway is patent lungs are clear without wheezing. There is no urticarial rash. She was given epinephrine, Benadryl, and steroids, with relief of her symptoms. She was advised to take Benadryl and to carry an EpiPen in her purse. She can followup with the family practice center.   I saw and evaluated the patient, reviewed the resident's note and I agree with the findings and plan. Osvaldo Human, M.D.

## 2011-03-01 NOTE — ED Notes (Signed)
Pt woke up at 0530 and noticed that her throat and lips were swelling - states it feels like the allergic reaction she has when she eats shellfish.  Denies sob.  Took 25 mg of benadryl at 0545.

## 2011-03-01 NOTE — ED Notes (Signed)
Tongue appears swollen - unable to see back of throat. Pt states her tongue and lips are tingling.

## 2011-07-05 ENCOUNTER — Ambulatory Visit (INDEPENDENT_AMBULATORY_CARE_PROVIDER_SITE_OTHER): Payer: Medicaid Other | Admitting: Family Medicine

## 2011-07-05 ENCOUNTER — Other Ambulatory Visit (HOSPITAL_COMMUNITY)
Admission: RE | Admit: 2011-07-05 | Discharge: 2011-07-05 | Disposition: A | Discharge status: Home / Self Care | Payer: Medicaid Other | Source: Ambulatory Visit | Attending: Family Medicine | Admitting: Family Medicine

## 2011-07-05 VITALS — BP 94/65 | HR 88 | Temp 97.7°F | Ht 61.58 in | Wt 201.4 lb

## 2011-07-05 DIAGNOSIS — Z113 Encounter for screening for infections with a predominantly sexual mode of transmission: Secondary | ICD-10-CM | POA: Insufficient documentation

## 2011-07-05 DIAGNOSIS — Z01419 Encounter for gynecological examination (general) (routine) without abnormal findings: Secondary | ICD-10-CM | POA: Insufficient documentation

## 2011-07-05 DIAGNOSIS — F341 Dysthymic disorder: Secondary | ICD-10-CM

## 2011-07-05 DIAGNOSIS — Z309 Encounter for contraceptive management, unspecified: Secondary | ICD-10-CM

## 2011-07-05 DIAGNOSIS — N760 Acute vaginitis: Secondary | ICD-10-CM

## 2011-07-05 DIAGNOSIS — F17200 Nicotine dependence, unspecified, uncomplicated: Secondary | ICD-10-CM

## 2011-07-05 DIAGNOSIS — Z124 Encounter for screening for malignant neoplasm of cervix: Secondary | ICD-10-CM

## 2011-07-05 DIAGNOSIS — N926 Irregular menstruation, unspecified: Secondary | ICD-10-CM

## 2011-07-05 DIAGNOSIS — F418 Other specified anxiety disorders: Secondary | ICD-10-CM

## 2011-07-05 DIAGNOSIS — N949 Unspecified condition associated with female genital organs and menstrual cycle: Secondary | ICD-10-CM

## 2011-07-05 MED ORDER — VARENICLINE TARTRATE 1 MG PO TABS: 1.0000 mg | ORAL_TABLET | Freq: Two times a day (BID) | ORAL | Status: DC

## 2011-07-05 MED ORDER — CITALOPRAM HYDROBROMIDE 20 MG PO TABS: 20.0000 mg | ORAL_TABLET | Freq: Every day | ORAL | Status: DC

## 2011-07-05 NOTE — Assessment & Plan Note (Signed)
No further problems with this.

## 2011-07-05 NOTE — Assessment & Plan Note (Signed)
She is ready to quit. Discussed Chantix to severe. Please instructions below.

## 2011-07-05 NOTE — Patient Instructions (Signed)
Take the Chantix once a day for 3 days, and then twice daily after that. Choose a quit date and start the medicine 7 days beforehand. You can smoke through that week.    I sent in the Citalopram and let me know how you're doing.  I 'll send the pap smear results.

## 2011-07-05 NOTE — Progress Notes (Signed)
  Subjective:    Patient ID: Kerry Chavez, female    DOB: November 29, 1985, 26 y.o.   MRN: [id number]  HPI 26 year old female here for complete physical exam including Pap smear. She also has concerns regarding depression and smoking cessation:  #1. Depression: Patient placed on citalopram about one year ago her previous PCP. She is having trouble with worsening depression at that time. She noticed improvement after 2 weeks of citalopram. She began taking this regularly until December when her insurance ran out. To make it last she was taking this once or twice a week. She did notice a change in her mood at this time. She has had an aunt and uncle both died from cancer she is very close to within the past month. She states is not as effective for depression.  Still going out and doing ADLs.  No SI/HI.    2.  smoking cessation: She is ready to quit.  Her daughter has asthma and she wants to quit her daughter's health. She smokes anywhere from 10 cigarettes a pack a day. She is provided with Chantix in the past but never got this filled.  Review of Systems The patient denies fever, unusual weight change, decreased hearing, chest pain, palpitations, pre-syncopal or syncopal episodes, dyspnea on exertion, prolonged cough, hemoptysis, change in bowel habits, melena, hematochezia, severe indigestion/heartburn, nausea/vomiting/abdominal pain, genital sores, muscle weakness, difficulty walking, abnormal bleeding, or enlarged lymph nodes.        Objective:   Physical Exam Gen:  Alert, cooperative patient who appears stated age in no acute distress.  Vital signs reviewed. HEENT:  [place]/AT.  EOMI, PERRL.  MMM, tonsils non-erythematous, non-edematous.  External ears WNL, Bilateral TM's normal without retraction, redness or bulging.  Cardiac:  Regular rate and rhythm without murmur auscultated.  Good S1/S2. Pulm:  Clear to auscultation bilaterally with good air movement.  No wheezes or rales noted.   Abd:   Soft/nondistended/nontender.  Good bowel sounds throughout all four quadrants.  No masses noted.  GYN:  External genitalia within normal limits.  Vaginal mucosa pink, moist, normal rugae.  Nonfriable cervix without lesions, no discharge and minimal bleeding noted on speculum exam.  Bimanual exam revealed normal, nongravid uterus.  No cervical motion tenderness. No adnexal masses bilaterally.  Pap performed. Ext:  No edema         Assessment & Plan:

## 2011-07-05 NOTE — Assessment & Plan Note (Signed)
Discussed regular use of citalopram. She expresses understanding. Discussed that this will help with any worsening that comes from a regular SSRI use. For a followup with me in about 2 weeks by phone and in person in 3 months. Assess for improvement at that time.

## 2011-07-05 NOTE — Assessment & Plan Note (Signed)
Not sexually active. Last Depo sometime middle of last year. She does not desire any further distention.

## 2011-07-06 ENCOUNTER — Encounter (HOSPITAL_COMMUNITY): Payer: Self-pay | Admitting: Family Medicine

## 2011-08-06 ENCOUNTER — Encounter: Payer: Self-pay | Admitting: Family Medicine

## 2011-08-06 ENCOUNTER — Ambulatory Visit (INDEPENDENT_AMBULATORY_CARE_PROVIDER_SITE_OTHER): Payer: Medicaid Other | Admitting: Family Medicine

## 2011-08-06 ENCOUNTER — Telehealth: Payer: Self-pay | Admitting: Family Medicine

## 2011-08-06 VITALS — BP 108/82 | HR 81 | Temp 98.5°F | Ht 61.75 in | Wt 199.0 lb

## 2011-08-06 DIAGNOSIS — R21 Rash and other nonspecific skin eruption: Secondary | ICD-10-CM | POA: Insufficient documentation

## 2011-08-06 MED ORDER — LORATADINE 10 MG PO TABS: 10.0000 mg | ORAL_TABLET | Freq: Every day | ORAL | Status: DC

## 2011-08-06 MED ORDER — TRIAMCINOLONE ACETONIDE 0.1 % EX CREA: TOPICAL_CREAM | Freq: Two times a day (BID) | CUTANEOUS | Status: DC

## 2011-08-06 NOTE — Progress Notes (Signed)
  Subjective:    Patient ID: Kerry Chavez, female    DOB: [birth year], 26 y.o.   MRN: [id number]  HPI Acute visit: rash on back of left leg Started 2 days ago.  She has been sitting on porch recently.  No one else in family has rash.   Review of Systems No difficulty breathing    Objective:   Physical Exam Gen: NAD Skin: 8 areas almost vertically down back of left thigh, erythematous, papular, with head. No pus.  Pulm: NI WOB    Assessment & Plan:

## 2011-08-06 NOTE — Patient Instructions (Signed)
I think you have bug bites. Try the loratadine (anti-histamine) orally and use the steroid cream twice a day.  If you still itch, try OTC calamine lotion.  Return to clinic or go to Urgent Care or ED if you develop difficulty breathing or worsening leg swelling in the next few days.

## 2011-08-06 NOTE — Telephone Encounter (Signed)
Patient states yesterday the back of her leg looked like 10 mosquito bites. Today  whelping and  and itching  badly . Advised to come to office now.

## 2011-08-06 NOTE — Telephone Encounter (Signed)
Rash on back of legs - going up - wants to come in today

## 2011-08-06 NOTE — Assessment & Plan Note (Signed)
New problem. Down back of right thigh. Likely but bites. Started 2 days ago. Likely from when she was sitting on porch.  Loratadine, TAC 0.1% bid, calamine lotion.  Given indication to RTC or go to ED/UC.

## 2011-09-19 ENCOUNTER — Other Ambulatory Visit: Payer: Self-pay | Admitting: Family Medicine

## 2011-09-24 ENCOUNTER — Other Ambulatory Visit: Payer: Self-pay | Admitting: Family Medicine

## 2011-09-24 NOTE — Telephone Encounter (Signed)
Patient is calling because she needs a refill on her Epipen.  She uses [facility] on [address].  She did attempt to contact her pharmacy for a refill, but she had gotten in through the ER previously and did not have any refills on it so her pharmacy told her to contact her MD.

## 2011-09-26 ENCOUNTER — Ambulatory Visit (INDEPENDENT_AMBULATORY_CARE_PROVIDER_SITE_OTHER): Payer: Medicaid Other | Admitting: Family Medicine

## 2011-09-26 ENCOUNTER — Encounter: Payer: Self-pay | Admitting: Family Medicine

## 2011-09-26 VITALS — BP 117/87 | HR 87 | Temp 98.1°F | Ht 61.75 in | Wt 198.0 lb

## 2011-09-26 DIAGNOSIS — F17200 Nicotine dependence, unspecified, uncomplicated: Secondary | ICD-10-CM

## 2011-09-26 DIAGNOSIS — Z309 Encounter for contraceptive management, unspecified: Secondary | ICD-10-CM

## 2011-09-26 LAB — POCT URINE PREGNANCY: Preg Test, Ur: NEGATIVE

## 2011-09-26 MED ORDER — EPINEPHRINE 0.3 MG/0.3ML IJ DEVI: 0.3000 mg | Freq: Once | INTRAMUSCULAR | Status: DC

## 2011-09-26 MED ORDER — MEDROXYPROGESTERONE ACETATE 150 MG/ML IM SUSP
150.0000 mg | Freq: Once | INTRAMUSCULAR | Status: AC
  Administered 2011-09-26: 150 mg via INTRAMUSCULAR

## 2011-09-26 NOTE — Assessment & Plan Note (Signed)
Patient restarted on Depo Provera q 3 months following negative pregnancy test today.

## 2011-09-26 NOTE — Patient Instructions (Addendum)
Epipen script sent to your pharmacy.  Follow up with me (Dr. Adriana Simas) to discuss smoking cessation/chantix.  Have a great day!

## 2011-09-26 NOTE — Assessment & Plan Note (Signed)
Patient educated on the harms/risks of smoking.  Patient is going to start Chantix that was previously prescribed.   Follow up with me in 1 month to discuss smoking cessation.

## 2011-09-26 NOTE — Progress Notes (Signed)
Subjective:     Patient ID: Kerry Chavez, female   DOB: Aug 21, 1985, 26 y.o.   MRN: [id number]  HPI Ms. Capistran is a pleasant 26 year old here today to restart Depo Provera.  She also needs a refill of her Epipen.  Ms. Hausmann states that she is doing well.  She does report some moodiness and anxiousness, which she attributes to normal life stressors.  She continues to take Celexa for depression/anxiety.  She also reports that she continues to smoke and has not taken her Chantix that was previously prescribed.  Review of Systems  See HPI    Objective:   Physical Exam Gen: Well appearing 26 year old.  CV:  RRR, no murmurs, rubs, or gallops. Resp:  Clear to ausculation bilaterally.  GI:  Abdomen soft, nontender.  Assessment:         Plan:

## 2011-10-15 ENCOUNTER — Other Ambulatory Visit: Payer: Self-pay | Admitting: Family Medicine

## 2011-10-15 MED ORDER — EPINEPHRINE 0.3 MG/0.3ML IJ DEVI: 0.3000 mg | Freq: Once | INTRAMUSCULAR | Status: DC

## 2011-10-15 NOTE — Telephone Encounter (Signed)
Filled prescription for patient.

## 2011-10-17 ENCOUNTER — Telehealth: Payer: Self-pay | Admitting: Family Medicine

## 2011-10-17 NOTE — Telephone Encounter (Signed)
Spoke with Ms. Kerry Chavez today and informed her that I sent her epipen prescription to her pharmacy.

## 2011-10-24 ENCOUNTER — Ambulatory Visit: Payer: Medicaid Other | Admitting: Family Medicine

## 2011-11-02 NOTE — Telephone Encounter (Signed)
I am not able to close this encounter 

## 2012-01-23 NOTE — Telephone Encounter (Signed)
Spoke with [employer] at [facility].  Will research and attempt to close encounter.  Also, will route to Dr. Adriana Simas and have him complete chart prior to closing encounter.    Gaylene Brooks, RN

## 2012-08-03 ENCOUNTER — Ambulatory Visit (INDEPENDENT_AMBULATORY_CARE_PROVIDER_SITE_OTHER): Payer: No Typology Code available for payment source | Admitting: Family Medicine

## 2012-08-03 VITALS — BP 120/80 | HR 101 | Temp 98.6°F | Resp 16 | Ht 61.0 in | Wt 188.0 lb

## 2012-08-03 DIAGNOSIS — R0602 Shortness of breath: Secondary | ICD-10-CM

## 2012-08-03 DIAGNOSIS — J45909 Unspecified asthma, uncomplicated: Secondary | ICD-10-CM

## 2012-08-03 DIAGNOSIS — R05 Cough: Secondary | ICD-10-CM

## 2012-08-03 MED ORDER — METHYLPREDNISOLONE ACETATE 80 MG/ML IJ SUSP
80.0000 mg | Freq: Once | INTRAMUSCULAR | Status: AC
  Administered 2012-08-03: 80 mg via INTRAMUSCULAR

## 2012-08-03 MED ORDER — ALBUTEROL SULFATE (2.5 MG/3ML) 0.083% IN NEBU: 2.5000 mg | INHALATION_SOLUTION | Freq: Once | RESPIRATORY_TRACT | Status: DC

## 2012-08-03 MED ORDER — IPRATROPIUM-ALBUTEROL 0.5-2.5 (3) MG/3ML IN SOLN
3.0000 mL | Freq: Once | RESPIRATORY_TRACT | Status: AC
  Administered 2012-08-03: 3 mL via RESPIRATORY_TRACT

## 2012-08-03 MED ORDER — IPRATROPIUM BROMIDE 0.02 % IN SOLN: 0.5000 mg | Freq: Once | RESPIRATORY_TRACT | Status: DC

## 2012-08-03 MED ORDER — ALBUTEROL SULFATE (2.5 MG/3ML) 0.083% IN NEBU: 2.5000 mg | INHALATION_SOLUTION | Freq: Four times a day (QID) | RESPIRATORY_TRACT | Status: DC | PRN

## 2012-08-03 NOTE — Progress Notes (Signed)
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 She's had no fever or hemoptysis.  Patient is a smoker and has a history of asthma as a child. Her daughter also has asthma. She tried her daughter's nebulizer but he gave her no relief and so she came here after going to church.  Objective: Patient is tearful and frightened with mild tachypnea. She is appropriate however, alert, cooperative HEENT: Unremarkable Neck: Supple no adenopathy Chest: Bilateral inspiratory and expiratory wheezes with decreased air movement Heart: Regular and tachycardic at over 100 beats per minute, no murmur, no rub or gallop  Marked improvement in aeration and decrease in wheezing after nebulizer  Assessment:  Asthma.  Acute.  Improved after neb  Plan:  Albuterol nebulizers qid prn Patient agrees to quit smoking forever and ever.  Return as needed  Signed, Elvina Sidle, MD

## 2012-09-25 ENCOUNTER — Encounter: Payer: Medicaid Other | Admitting: Family Medicine

## 2013-04-02 ENCOUNTER — Telehealth: Payer: Self-pay | Admitting: Family Medicine

## 2013-04-02 ENCOUNTER — Other Ambulatory Visit (HOSPITAL_COMMUNITY)
Admission: RE | Admit: 2013-04-02 | Discharge: 2013-04-02 | Disposition: A | Discharge status: Home / Self Care | Payer: No Typology Code available for payment source | Source: Ambulatory Visit | Attending: Family Medicine | Admitting: Family Medicine

## 2013-04-02 ENCOUNTER — Encounter: Payer: Self-pay | Admitting: Family Medicine

## 2013-04-02 ENCOUNTER — Ambulatory Visit (INDEPENDENT_AMBULATORY_CARE_PROVIDER_SITE_OTHER): Payer: No Typology Code available for payment source | Admitting: Family Medicine

## 2013-04-02 VITALS — BP 110/72 | HR 86 | Temp 98.5°F | Ht 61.0 in | Wt 180.0 lb

## 2013-04-02 DIAGNOSIS — F341 Dysthymic disorder: Secondary | ICD-10-CM

## 2013-04-02 DIAGNOSIS — N898 Other specified noninflammatory disorders of vagina: Secondary | ICD-10-CM

## 2013-04-02 DIAGNOSIS — F418 Other specified anxiety disorders: Secondary | ICD-10-CM

## 2013-04-02 DIAGNOSIS — Z113 Encounter for screening for infections with a predominantly sexual mode of transmission: Secondary | ICD-10-CM | POA: Insufficient documentation

## 2013-04-02 LAB — POCT WET PREP (WET MOUNT): Clue Cells Wet Prep Whiff POC: POSITIVE

## 2013-04-02 MED ORDER — CITALOPRAM HYDROBROMIDE 20 MG PO TABS: 20.0000 mg | ORAL_TABLET | Freq: Every day | ORAL | Status: DC

## 2013-04-02 MED ORDER — METRONIDAZOLE 500 MG PO TABS: 500.0000 mg | ORAL_TABLET | Freq: Two times a day (BID) | ORAL | Status: DC

## 2013-04-02 NOTE — Patient Instructions (Addendum)
It has been a pleasure to see you today. Please take the medications as prescribed. I will call you with the labs results if they come back abnormal otherwise you will receive a letter. Make your next appointment in 2-3 weeks.

## 2013-04-02 NOTE — Telephone Encounter (Signed)
Called pt back about her wet prep results positive for BV. Agreeable to treat with oral metronidazole. Prescription sent to pharmacy.

## 2013-04-02 NOTE — Assessment & Plan Note (Signed)
Vaginal discharge x2 months. No signs of PID on. Wet Prep, GC and Chlamydia obtained. We'll followup on results

## 2013-04-02 NOTE — Assessment & Plan Note (Signed)
Denied suicidal ideation or plans. Noncompliance with citalopram has been discussed in the past. We refilled prescription and discuss the importance of proper followup in 2-3 weeks.

## 2013-04-02 NOTE — Telephone Encounter (Signed)
Would like to know results ° °

## 2013-04-02 NOTE — Progress Notes (Signed)
Family Medicine Office Visit Note   Subjective:   Patient ID: Brock RaCourtney Lira, female  DOB: July 01, 1985, 28 y.o.. MRN: [id number]   This is my first time seen Ms. Kerry Chavez. She comes today with complains of vaginal discharge. She reports has symptoms for about 2-3 months. Vaginal discharge is described as grayish with a fishy smell. She states has used vinegar an other home preparations for her symptoms without relieve. She is no sexually active at this time, and denies risky sexual behaviors.  Patient also wants to address her depression: She has been on citalopram 20 mg daily but reports taking it inconsistently in the past. She had a recent family loss and reports would like to renew her prescription and take it as prescribed. Denies suicidal ideation or plans.  Review of Systems:  Pt denies SOB, chest pain, palpitations, headaches, dizziness, numbness or weakness. No changes on urinary or BM habits. No unintentional weigh loss/gain.  Objective:   Physical Exam: Gen:  NAD HEENT: Moist mucous membranes  CV: Regular rate and rhythm, no murmurs rubs or gallops PULM: Clear to auscultation bilaterally. No wheezes/rales/rhonchi ABD: Soft, non tender, non distended, normal bowel sounds EXT: No edema Neuro: Alert and oriented x3. No focalization Vulva and perianal area: Normal  Speculum: Vagina and cervix of normal appearance, no friability, white curdy discharge. Bimanual exam: Uterus anteverted, no adnexal masses. No cervical motion tenderness. Psychiatry: depressed mood and tearful. No agitation or hallucinations. Normal speech and content>  Assessment & Plan:

## 2013-04-15 ENCOUNTER — Telehealth: Payer: Self-pay | Admitting: Family Medicine

## 2013-04-15 NOTE — Telephone Encounter (Signed)
Patient calls wanting lab results from OV 04/02/13.

## 2013-04-15 NOTE — Telephone Encounter (Signed)
LVM for patient to call back. ?

## 2013-04-16 NOTE — Telephone Encounter (Signed)
Spoke with patient about her labs and gave her results

## 2013-10-08 ENCOUNTER — Inpatient Hospital Stay (HOSPITAL_COMMUNITY)
Admission: AD | Admit: 2013-10-08 | Discharge: 2013-10-09 | Disposition: A | Discharge status: Home / Self Care | Payer: No Typology Code available for payment source | Source: Ambulatory Visit | Attending: Obstetrics & Gynecology | Admitting: Obstetrics & Gynecology

## 2013-10-08 DIAGNOSIS — A499 Bacterial infection, unspecified: Secondary | ICD-10-CM | POA: Insufficient documentation

## 2013-10-08 DIAGNOSIS — O2390 Unspecified genitourinary tract infection in pregnancy, unspecified trimester: Secondary | ICD-10-CM | POA: Insufficient documentation

## 2013-10-08 DIAGNOSIS — N760 Acute vaginitis: Secondary | ICD-10-CM | POA: Insufficient documentation

## 2013-10-08 DIAGNOSIS — O209 Hemorrhage in early pregnancy, unspecified: Secondary | ICD-10-CM

## 2013-10-08 DIAGNOSIS — B9689 Other specified bacterial agents as the cause of diseases classified elsewhere: Secondary | ICD-10-CM | POA: Insufficient documentation

## 2013-10-08 DIAGNOSIS — O99330 Smoking (tobacco) complicating pregnancy, unspecified trimester: Secondary | ICD-10-CM | POA: Insufficient documentation

## 2013-10-08 DIAGNOSIS — O26899 Other specified pregnancy related conditions, unspecified trimester: Secondary | ICD-10-CM

## 2013-10-08 DIAGNOSIS — R109 Unspecified abdominal pain: Secondary | ICD-10-CM

## 2013-10-09 ENCOUNTER — Encounter (HOSPITAL_COMMUNITY): Payer: Self-pay | Admitting: *Deleted

## 2013-10-09 ENCOUNTER — Inpatient Hospital Stay (HOSPITAL_COMMUNITY): Payer: No Typology Code available for payment source

## 2013-10-09 DIAGNOSIS — N760 Acute vaginitis: Secondary | ICD-10-CM

## 2013-10-09 DIAGNOSIS — A499 Bacterial infection, unspecified: Secondary | ICD-10-CM

## 2013-10-09 DIAGNOSIS — B9689 Other specified bacterial agents as the cause of diseases classified elsewhere: Secondary | ICD-10-CM

## 2013-10-09 LAB — CBC WITH DIFFERENTIAL/PLATELET
Basophils Absolute: 0 10*3/uL (ref 0.0–0.1)
Basophils Relative: 0 % (ref 0–1)
EOS ABS: 0.2 10*3/uL (ref 0.0–0.7)
EOS PCT: 1 % (ref 0–5)
HEMATOCRIT: 36 % (ref 36.0–46.0)
Hemoglobin: 12.5 g/dL (ref 12.0–15.0)
LYMPHS ABS: 3.3 10*3/uL (ref 0.7–4.0)
LYMPHS PCT: 25 % (ref 12–46)
MCH: 32.9 pg (ref 26.0–34.0)
MCHC: 34.7 g/dL (ref 30.0–36.0)
MCV: 94.7 fL (ref 78.0–100.0)
MONO ABS: 0.9 10*3/uL (ref 0.1–1.0)
MONOS PCT: 7 % (ref 3–12)
Neutro Abs: 9 10*3/uL — ABNORMAL HIGH (ref 1.7–7.7)
Neutrophils Relative %: 67 % (ref 43–77)
Platelets: 223 10*3/uL (ref 150–400)
RBC: 3.8 MIL/uL — AB (ref 3.87–5.11)
RDW: 14.4 % (ref 11.5–15.5)
WBC: 13.4 10*3/uL — AB (ref 4.0–10.5)

## 2013-10-09 LAB — URINALYSIS, ROUTINE W REFLEX MICROSCOPIC
BILIRUBIN URINE: NEGATIVE
Glucose, UA: NEGATIVE mg/dL
KETONES UR: 15 mg/dL — AB
Leukocytes, UA: NEGATIVE
NITRITE: NEGATIVE
PH: 6 (ref 5.0–8.0)
Protein, ur: NEGATIVE mg/dL
UROBILINOGEN UA: 2 mg/dL — AB (ref 0.0–1.0)

## 2013-10-09 LAB — HCG, QUANTITATIVE, PREGNANCY: hCG, Beta Chain, Quant, S: 322 m[IU]/mL — ABNORMAL HIGH (ref <5)

## 2013-10-09 LAB — URINE MICROSCOPIC-ADD ON

## 2013-10-09 LAB — WET PREP, GENITAL
Trich, Wet Prep: NONE SEEN
YEAST WET PREP: NONE SEEN

## 2013-10-09 LAB — POCT PREGNANCY, URINE: Preg Test, Ur: POSITIVE — AB

## 2013-10-09 LAB — ABO/RH: ABO/RH(D): O POS

## 2013-10-09 MED ORDER — METRONIDAZOLE 0.75 % VA GEL
1.0000 | Freq: Two times a day (BID) | VAGINAL | Status: DC
Start: 2013-10-09 — End: 2013-10-20

## 2013-10-09 MED ORDER — METRONIDAZOLE 500 MG PO TABS: 500.0000 mg | ORAL_TABLET | Freq: Two times a day (BID) | ORAL | Status: DC

## 2013-10-09 NOTE — Discharge Instructions (Signed)
Human Chorionic Gonadotropin (hCG) °This is a test to confirm and monitor pregnancy or to diagnose trophoblastic disease or germ cell tumors. °As early as 10 days after a missed menstrual period (some methods can detect hCG even earlier, at one week after conception) or if your caregiver thinks that your symptoms suggest ectopic pregnancy, a failing pregnancy, trophoblastic disease, or germ cell tumors. hCG is a protein produced in the placenta of a pregnant woman. A pregnancy test is a specific blood or urine test that can detect hCG and confirm pregnancy. This hormone is able to be detected 10 days after a missed menstrual period, the time period when the fertilized egg is implanted in the woman's uterus. With some methods, hCG can be detected even earlier, at one week after conception.  °During the early weeks of pregnancy, hCG is important in maintaining function of the corpus luteum (the mass of cells that forms from a mature egg). Production of hCG increases steadily during the first trimester (8-10 weeks), peaking around the 10th week after the last menstrual cycle. Levels then fall slowly during the remainder of the pregnancy. hCG is no longer detectable within a few weeks after delivery. hCG is also produced by some germ cell tumors and increased levels are seen in trophoblastic disease. °SAMPLE COLLECTION °hCG is commonly detected in urine. The preferred specimen is a random urine sample collected first thing in the morning. hCG can also be measured in blood drawn from a vein in the arm. °NORMAL FINDINGS °Qualitative: negative in non-pregnant women; positive in pregnancy °Quantitative:  °· Gestation less than 1 week: 5-50 Whole HCG (milli-international units/mL) °· Gestation of 2 weeks: 50-500 Whole HCG (milli-international units/mL) °· Gestation of 3 weeks: 100-10,000 Whole HCG (milli-international units/mL) °· Gestation of 4 weeks: 1,000-30,000 Whole HCG (milli-international units/mL) °· Gestation of 5  weeks 3,500-115,000 Whole HCG (milli-international units/mL) °· Gestation of 6-8 weeks: 12,000-270,000 Whole HCG (milli-international units/mL) °· Gestation of 12 weeks: 15,000-220,000 Whole HCG (milli-international units/mL) °· Males and non-pregnant females: less than 5 Whole HCG (milli-international units/mL) °Beta subunit: depends on the method and test used °Ranges for normal findings may vary among different laboratories and hospitals. You should always check with your doctor after having lab work or other tests done to discuss the meaning of your test results and whether your values are considered within normal limits. °MEANING OF TEST  °Your caregiver will go over the test results with you and discuss the importance and meaning of your results, as well as treatment options and the need for additional tests if necessary. °OBTAINING THE TEST RESULTS °It is your responsibility to obtain your test results. Ask the lab or department performing the test when and how you will get your results. °Document Released: 04/13/2004 Document Revised: 06/04/2011 Document Reviewed: 02/21/2008 °ExitCare® Patient Information ©2015 [employer]. This information is not intended to replace advice given to you by your health care provider. Make sure you discuss any questions you have with your health care provider. ° °

## 2013-10-09 NOTE — MAU Provider Note (Signed)
History     CSN: [id number]  Arrival date and time: 10/08/13 2353   First Provider Initiated Contact with Patient 10/09/13 0112      No chief complaint on file.  HPI Ms. Kerry Chavez is a 28 y.o. G3P1 at [redacted]w[redacted]d by LMP who presents to MAU today with complaint of vaginal discharge. She states that she has a history of recurrent BV. She has had a thin, white, malodorous discharge. She denies itching or irritation. She states spotting on Wednesday, but none now. She has also had cramping off and on over the last few days. She denies fever, N/V/D or UTI symptoms.   OB History   Grav Para Term Preterm Abortions TAB SAB Ect Mult Living   3 1        1       Past Medical History  Diagnosis Date  . Asthma   . Depression     Past Surgical History  Procedure Laterality Date  . Tonsillectomy and adenoidectomy  1998  . Cesarean section      History reviewed. No pertinent family history.  History  Substance Use Topics  . Smoking status: Current Every Day Smoker -- 10 years    Types: Cigarettes  . Smokeless tobacco: Not on file  . Alcohol Use: Yes     Comment: LAST-  3 WEEKS AGO- TEQUILA    Allergies:  Allergies  Allergen Reactions  . Shellfish Allergy Swelling  . Ivp Dye [Iodinated Diagnostic Agents]   . Penicillins     REACTION: rash  . Sulfamethoxazole     REACTION: rash    No prescriptions prior to admission    Review of Systems  Constitutional: Negative for fever and malaise/fatigue.  Gastrointestinal: Positive for abdominal pain. Negative for nausea, vomiting, diarrhea and constipation.  Genitourinary: Negative for dysuria, urgency and frequency.       + vaginal bleeding, discharge   Physical Exam   Blood pressure 92/61, pulse 89, temperature 98.7 F (37.1 C), temperature source Oral, height 5' (1.524 m), weight 184 lb 4 oz (83.575 kg), last menstrual period 09/12/2013.  Physical Exam  Constitutional: She is oriented to person, place, and time. She appears  well-developed and well-nourished. No distress.  HENT:  Head: Normocephalic and atraumatic.  Cardiovascular: Normal rate.   Respiratory: Effort normal.  GI: Soft. She exhibits no distension and no mass. There is no tenderness. There is no rebound and no guarding.  Genitourinary: Uterus is not enlarged and not tender. Cervix exhibits no motion tenderness, no discharge and no friability. Right adnexum displays no mass and no tenderness. Left adnexum displays no mass and no tenderness. No bleeding around the vagina. Vaginal discharge (small amount of thin, white, malodorous discharge noted) found.  Neurological: She is alert and oriented to person, place, and time.  Skin: Skin is warm and dry. No erythema.  Psychiatric: She has a normal mood and affect.   Results for orders placed during the hospital encounter of 10/08/13 (from the past 24 hour(s))  URINALYSIS, ROUTINE W REFLEX MICROSCOPIC     Status: Abnormal   Collection Time    10/08/13 11:59 PM      Result Value Ref Range   Color, Urine YELLOW  YELLOW   APPearance CLEAR  CLEAR   Specific Gravity, Urine >1.030 (*) 1.005 - 1.030   pH 6.0  5.0 - 8.0   Glucose, UA NEGATIVE  NEGATIVE mg/dL   Hgb urine dipstick LARGE (*) NEGATIVE   Bilirubin Urine NEGATIVE  NEGATIVE  Ketones, ur 15 (*) NEGATIVE mg/dL   Protein, ur NEGATIVE  NEGATIVE mg/dL   Urobilinogen, UA 2.0 (*) 0.0 - 1.0 mg/dL   Nitrite NEGATIVE  NEGATIVE   Leukocytes, UA NEGATIVE  NEGATIVE  URINE MICROSCOPIC-ADD ON     Status: Abnormal   Collection Time    10/08/13 11:59 PM      Result Value Ref Range   Squamous Epithelial / LPF FEW (*) RARE   WBC, UA 0-2  <3 WBC/hpf   RBC / HPF 7-10  <3 RBC/hpf   Bacteria, UA FEW (*) RARE   Urine-Other MUCOUS PRESENT    POCT PREGNANCY, URINE     Status: Abnormal   Collection Time    10/09/13 12:04 AM      Result Value Ref Range   Preg Test, Ur POSITIVE (*) NEGATIVE  WET PREP, GENITAL     Status: Abnormal   Collection Time    10/09/13   1:25 AM      Result Value Ref Range   Yeast Wet Prep HPF POC NONE SEEN  NONE SEEN   Trich, Wet Prep NONE SEEN  NONE SEEN   Clue Cells Wet Prep HPF POC MODERATE (*) NONE SEEN   WBC, Wet Prep HPF POC FEW (*) NONE SEEN  CBC WITH DIFFERENTIAL     Status: Abnormal   Collection Time    10/09/13  1:33 AM      Result Value Ref Range   WBC 13.4 (*) 4.0 - 10.5 K/uL   RBC 3.80 (*) 3.87 - 5.11 MIL/uL   Hemoglobin 12.5  12.0 - 15.0 g/dL   HCT 11.9  14.7 - 82.9 %   MCV 94.7  78.0 - 100.0 fL   MCH 32.9  26.0 - 34.0 pg   MCHC 34.7  30.0 - 36.0 g/dL   RDW 56.2  13.0 - 86.5 %   Platelets 223  150 - 400 K/uL   Neutrophils Relative % 67  43 - 77 %   Neutro Abs 9.0 (*) 1.7 - 7.7 K/uL   Lymphocytes Relative 25  12 - 46 %   Lymphs Abs 3.3  0.7 - 4.0 K/uL   Monocytes Relative 7  3 - 12 %   Monocytes Absolute 0.9  0.1 - 1.0 K/uL   Eosinophils Relative 1  0 - 5 %   Eosinophils Absolute 0.2  0.0 - 0.7 K/uL   Basophils Relative 0  0 - 1 %   Basophils Absolute 0.0  0.0 - 0.1 K/uL  ABO/RH     Status: None   Collection Time    10/09/13  1:33 AM      Result Value Ref Range   ABO/RH(D) O POS    HCG, QUANTITATIVE, PREGNANCY     Status: Abnormal   Collection Time    10/09/13  1:33 AM      Result Value Ref Range   hCG, Beta Chain, Quant, S 322 (*) <5 mIU/mL   US Ob Comp Less 14 Wks  10/09/2013   CLINICAL DATA:  Pregnancy with pain and spotting  EXAM: OBSTETRIC <14 WK [place] AND TRANSVAGINAL OB US  TECHNIQUE: Both transabdominal and transvaginal ultrasound examinations were performed for complete evaluation of the gestation as well as the maternal uterus, adnexal regions, and pelvic cul-de-sac. Transvaginal technique was performed to assess early pregnancy.  COMPARISON:  None of this gestation  FINDINGS: No intra or extrauterine gestational sac is identified. The ovaries are symmetric in size and normal in appearance. There  is no adnexal mass. Small volume free pelvic fluid which appears simple. The uterus is  unremarkable in appearance. Noted quantitative beta HCG of 322.  IMPRESSION: 1. Pregnancy of unknown location. Differential considerations include intrauterine gestation too early to be sonographically visualized, spontaneous abortion, or ectopic pregnancy. Consider follow-up ultrasound in 10 days and serial quantitative beta HCG follow-up. 2. Small volume, simple pelvic fluid.   Electronically Signed   By: Tiburcio PeaJonathan  Watts M.D.   On: 10/09/2013 03:12   [place] Ob Transvaginal  10/09/2013   CLINICAL DATA:  Pregnancy with pain and spotting  EXAM: OBSTETRIC <14 WK US AND TRANSVAGINAL OB US  TECHNIQUE: Both transabdominal and transvaginal ultrasound examinations were performed for complete evaluation of the gestation as well as the maternal uterus, adnexal regions, and pelvic cul-de-sac. Transvaginal technique was performed to assess early pregnancy.  COMPARISON:  None of this gestation  FINDINGS: No intra or extrauterine gestational sac is identified. The ovaries are symmetric in size and normal in appearance. There is no adnexal mass. Small volume free pelvic fluid which appears simple. The uterus is unremarkable in appearance. Noted quantitative beta HCG of 322.  IMPRESSION: 1. Pregnancy of unknown location. Differential considerations include intrauterine gestation too early to be sonographically visualized, spontaneous abortion, or ectopic pregnancy. Consider follow-up ultrasound in 10 days and serial quantitative beta HCG follow-up. 2. Small volume, simple pelvic fluid.   Electronically Signed   By: Tiburcio PeaJonathan  Watts M.D.   On: 10/09/2013 03:12    MAU Course  Procedures None  MDM +UPT UA, wet prep, GC/chlamydia, CBC, ABO/Rh, quant hCG and US today  Assessment and Plan  A: Pregnancy of unknown location Bacterial vaginosis  P: Discharge home Rx for Metrogel sent to patient's pharmacy Bleeding/ectopic precautions discussed Patient to return to MAU in 48 hours for follow-up labs or sooner PRN  Freddi StarrJulie  N Ethier, PA-C  10/09/2013, 3:58 AM

## 2013-10-09 NOTE — MAU Note (Signed)
PT SAYS SHE HAS BACTERIAL INFECTION-  D/C HAS ODOR.  DID 2 HPT-   1- CLEAR AND  1 NOT BRIGHT.   LAST SEX-  6-26-   NO BIRTH CONTROL   WAS SEEN AT    [facility]-  03-2013   SMALL CRAMPS  AND HAD SPOTTING   ON WED.

## 2013-10-10 LAB — GC/CHLAMYDIA PROBE AMP
CT Probe RNA: NEGATIVE
GC Probe RNA: NEGATIVE

## 2013-10-11 ENCOUNTER — Inpatient Hospital Stay (HOSPITAL_COMMUNITY)
Admission: AD | Admit: 2013-10-11 | Discharge: 2013-10-11 | Disposition: A | Discharge status: Home / Self Care | Payer: No Typology Code available for payment source | Source: Ambulatory Visit | Attending: Obstetrics & Gynecology | Admitting: Obstetrics & Gynecology

## 2013-10-11 DIAGNOSIS — F3289 Other specified depressive episodes: Secondary | ICD-10-CM | POA: Diagnosis not present

## 2013-10-11 DIAGNOSIS — O99891 Other specified diseases and conditions complicating pregnancy: Secondary | ICD-10-CM | POA: Insufficient documentation

## 2013-10-11 DIAGNOSIS — O99340 Other mental disorders complicating pregnancy, unspecified trimester: Secondary | ICD-10-CM | POA: Diagnosis not present

## 2013-10-11 DIAGNOSIS — F329 Major depressive disorder, single episode, unspecified: Secondary | ICD-10-CM | POA: Insufficient documentation

## 2013-10-11 DIAGNOSIS — O99330 Smoking (tobacco) complicating pregnancy, unspecified trimester: Secondary | ICD-10-CM | POA: Diagnosis not present

## 2013-10-11 DIAGNOSIS — O3680X0 Pregnancy with inconclusive fetal viability, not applicable or unspecified: Secondary | ICD-10-CM

## 2013-10-11 DIAGNOSIS — O9989 Other specified diseases and conditions complicating pregnancy, childbirth and the puerperium: Principal | ICD-10-CM

## 2013-10-11 LAB — HCG, QUANTITATIVE, PREGNANCY: HCG, BETA CHAIN, QUANT, S: 1071 m[IU]/mL — AB (ref <5)

## 2013-10-11 NOTE — MAU Provider Note (Signed)
History     CSN: [id number]  Arrival date and time: 10/11/13 1415   First Provider Initiated Contact with Patient 10/11/13 1524      Chief Complaint  Patient presents with  . Labs Only   HPI Kerry Chavez 28 y.o. 8219w1d  Comes to MAU for repeat quant.  Previous note and labs from  10-09-13 reviewed.  Reports no problems today.    OB History   Grav Para Term Preterm Abortions TAB SAB Ect Mult Living   3 1        1       Past Medical History  Diagnosis Date  . Asthma   . Depression     Past Surgical History  Procedure Laterality Date  . Tonsillectomy and adenoidectomy  1998  . Cesarean section      No family history on file.  History  Substance Use Topics  . Smoking status: Current Every Day Smoker -- 10 years    Types: Cigarettes  . Smokeless tobacco: Not on file  . Alcohol Use: Yes     Comment: LAST-  3 WEEKS AGO- TEQUILA    Allergies:  Allergies  Allergen Reactions  . Shellfish Allergy Swelling  . Ivp Dye [Iodinated Diagnostic Agents]   . Penicillins     REACTION: rash  . Sulfamethoxazole     REACTION: rash    Prescriptions prior to admission  Medication Sig Dispense Refill  . albuterol (PROVENTIL) (2.5 MG/3ML) 0.083% nebulizer solution Take 3 mLs (2.5 mg total) by nebulization every 6 (six) hours as needed for wheezing.  150 mL  1  . citalopram (CELEXA) 20 MG tablet Take 1 tablet (20 mg total) by mouth daily.  90 tablet  3  . diphenhydrAMINE (BENADRYL) 25 MG tablet Take 25 mg by mouth once. Severe allergic reaction       . EPINEPHrine (EPIPEN) 0.3 mg/0.3 mL DEVI Inject 0.3 mLs (0.3 mg total) into the muscle once.  1 Device  0  . loratadine (CLARITIN) 10 MG tablet Take 1 tablet (10 mg total) by mouth daily.  30 tablet  11  . metroNIDAZOLE (METROGEL VAGINAL) 0.75 % vaginal gel Place 1 Applicatorful vaginally 2 (two) times daily.  70 g  0  . varenicline (CHANTIX) 1 MG tablet Take 1 mg by mouth 2 (two) times daily.        Review of Systems   Gastrointestinal: Negative for nausea, vomiting and abdominal pain.  Genitourinary:       No vaginal bleeding.   Physical Exam   Blood pressure 98/64, pulse 87, resp. rate 18, last menstrual period 09/12/2013.  Physical Exam  Nursing note and vitals reviewed. Constitutional: She is oriented to person, place, and time. She appears well-developed and well-nourished.  HENT:  Head: Normocephalic.  Eyes: EOM are normal.  Neck: Neck supple.  Respiratory: Effort normal.  Musculoskeletal: Normal range of motion.  Neurological: She is alert and oriented to person, place, and time.  Skin: Skin is warm and dry.  Psychiatric: She has a normal mood and affect.    MAU Course  Procedures  MDM Results for Kerry RaBROWN, [place] (MRN [id number]) as of 10/11/2013 15:32  Ref. Range 10/09/2013 00:04 10/09/2013 01:25 10/09/2013 01:33 10/09/2013 03:07 10/11/2013 14:22  hCG, Beta Chain, Quant, S Latest Range: <5 mIU/mL   322 (H)  1071 (H)   Assessment and Plan  Rising quants indicating progressing pregnancy  Plan No smoking, no drugs, no alcohol.  Take a prenatal vitamin one by mouth every day.  Eat small frequent snacks to avoid nausea.  Begin prenatal care as soon as possible. [place] to call and schedule follow up ultrasound. Pregnancy verification letter given. Plans to have prenatal care with [facility].  Client very excited about the news.    BURLESON,TERRI 10/11/2013, 3:25 PM

## 2013-10-11 NOTE — MAU Note (Signed)
Pt presents to MAU for repeat BHCG. Mild cramping, denies any vaginal bleeding

## 2013-10-14 NOTE — MAU Provider Note (Signed)
Attestation of Attending Supervision of Advanced Practitioner (CNM/NP): Evaluation and management procedures were performed by the Advanced Practitioner under my supervision and collaboration. I have reviewed the Advanced Practitioner's note and chart, and I agree with the management and plan.  Raeleen Winstanley H. 9:42 AM

## 2013-10-20 ENCOUNTER — Inpatient Hospital Stay (HOSPITAL_COMMUNITY): Payer: No Typology Code available for payment source

## 2013-10-20 ENCOUNTER — Encounter (HOSPITAL_COMMUNITY): Payer: Self-pay | Admitting: General Practice

## 2013-10-20 ENCOUNTER — Inpatient Hospital Stay (HOSPITAL_COMMUNITY)
Admission: AD | Admit: 2013-10-20 | Discharge: 2013-10-20 | Disposition: A | Discharge status: Home / Self Care | Payer: No Typology Code available for payment source | Source: Ambulatory Visit | Attending: Obstetrics | Admitting: Obstetrics

## 2013-10-20 DIAGNOSIS — B9689 Other specified bacterial agents as the cause of diseases classified elsewhere: Secondary | ICD-10-CM | POA: Insufficient documentation

## 2013-10-20 DIAGNOSIS — N760 Acute vaginitis: Secondary | ICD-10-CM | POA: Insufficient documentation

## 2013-10-20 DIAGNOSIS — F3289 Other specified depressive episodes: Secondary | ICD-10-CM | POA: Insufficient documentation

## 2013-10-20 DIAGNOSIS — A499 Bacterial infection, unspecified: Secondary | ICD-10-CM | POA: Insufficient documentation

## 2013-10-20 DIAGNOSIS — F329 Major depressive disorder, single episode, unspecified: Secondary | ICD-10-CM | POA: Insufficient documentation

## 2013-10-20 DIAGNOSIS — O99340 Other mental disorders complicating pregnancy, unspecified trimester: Secondary | ICD-10-CM | POA: Insufficient documentation

## 2013-10-20 DIAGNOSIS — O200 Threatened abortion: Secondary | ICD-10-CM | POA: Insufficient documentation

## 2013-10-20 DIAGNOSIS — O2390 Unspecified genitourinary tract infection in pregnancy, unspecified trimester: Secondary | ICD-10-CM | POA: Insufficient documentation

## 2013-10-20 DIAGNOSIS — O99330 Smoking (tobacco) complicating pregnancy, unspecified trimester: Secondary | ICD-10-CM | POA: Insufficient documentation

## 2013-10-20 LAB — URINALYSIS, ROUTINE W REFLEX MICROSCOPIC
Bilirubin Urine: NEGATIVE
Glucose, UA: NEGATIVE mg/dL
Ketones, ur: NEGATIVE mg/dL
Leukocytes, UA: NEGATIVE
NITRITE: NEGATIVE
PH: 6 (ref 5.0–8.0)
Protein, ur: NEGATIVE mg/dL
Specific Gravity, Urine: <1.005 — ABNORMAL LOW (ref 1.005–1.030)
Urobilinogen, UA: 0.2 mg/dL (ref 0.0–1.0)

## 2013-10-20 LAB — URINE MICROSCOPIC-ADD ON

## 2013-10-20 LAB — HCG, QUANTITATIVE, PREGNANCY: hCG, Beta Chain, Quant, S: 17594 m[IU]/mL — ABNORMAL HIGH (ref <5)

## 2013-10-20 LAB — ABO/RH: ABO/RH(D): O POS

## 2013-10-20 NOTE — Discharge Instructions (Signed)
Human Chorionic Gonadotropin (hCG) °This is a test to confirm and monitor pregnancy or to diagnose trophoblastic disease or germ cell tumors. °As early as 10 days after a missed menstrual period (some methods can detect hCG even earlier, at one week after conception) or if your caregiver thinks that your symptoms suggest ectopic pregnancy, a failing pregnancy, trophoblastic disease, or germ cell tumors. hCG is a protein produced in the placenta of a pregnant woman. A pregnancy test is a specific blood or urine test that can detect hCG and confirm pregnancy. This hormone is able to be detected 10 days after a missed menstrual period, the time period when the fertilized egg is implanted in the woman's uterus. With some methods, hCG can be detected even earlier, at one week after conception.  °During the early weeks of pregnancy, hCG is important in maintaining function of the corpus luteum (the mass of cells that forms from a mature egg). Production of hCG increases steadily during the first trimester (8-10 weeks), peaking around the 10th week after the last menstrual cycle. Levels then fall slowly during the remainder of the pregnancy. hCG is no longer detectable within a few weeks after delivery. hCG is also produced by some germ cell tumors and increased levels are seen in trophoblastic disease. °SAMPLE COLLECTION °hCG is commonly detected in urine. The preferred specimen is a random urine sample collected first thing in the morning. hCG can also be measured in blood drawn from a vein in the arm. °NORMAL FINDINGS °Qualitative: negative in non-pregnant women; positive in pregnancy °Quantitative:  °· Gestation less than 1 week: 5-50 Whole HCG (milli-international units/mL) °· Gestation of 2 weeks: 50-500 Whole HCG (milli-international units/mL) °· Gestation of 3 weeks: 100-10,000 Whole HCG (milli-international units/mL) °· Gestation of 4 weeks: 1,000-30,000 Whole HCG (milli-international units/mL) °· Gestation of 5  weeks 3,500-115,000 Whole HCG (milli-international units/mL) °· Gestation of 6-8 weeks: 12,000-270,000 Whole HCG (milli-international units/mL) °· Gestation of 12 weeks: 15,000-220,000 Whole HCG (milli-international units/mL) °· Males and non-pregnant females: less than 5 Whole HCG (milli-international units/mL) °Beta subunit: depends on the method and test used °Ranges for normal findings may vary among different laboratories and hospitals. You should always check with your doctor after having lab work or other tests done to discuss the meaning of your test results and whether your values are considered within normal limits. °MEANING OF TEST  °Your caregiver will go over the test results with you and discuss the importance and meaning of your results, as well as treatment options and the need for additional tests if necessary. °OBTAINING THE TEST RESULTS °It is your responsibility to obtain your test results. Ask the lab or department performing the test when and how you will get your results. °Document Released: 04/13/2004 Document Revised: 06/04/2011 Document Reviewed: 06/15/2013 °ExitCare® Patient Information ©2015 [employer]. This information is not intended to replace advice given to you by your health care provider. Make sure you discuss any questions you have with your health care provider. ° °Threatened Miscarriage °A threatened miscarriage occurs when you have vaginal bleeding during your first 20 weeks of pregnancy but the pregnancy has not ended. If you have vaginal bleeding during this time, your health care provider will do tests to make sure you are still pregnant. If the tests show you are still pregnant and the developing baby (fetus) inside your womb (uterus) is still growing, your condition is considered a threatened miscarriage. °A threatened miscarriage does not mean your pregnancy will end, but it does increase   the risk of losing your pregnancy (complete miscarriage). °CAUSES  °The cause of  a threatened miscarriage is usually not known. If you go on to have a complete miscarriage, the most common cause is an abnormal number of chromosomes in the developing baby. Chromosomes are the structures inside cells that hold all your genetic material. °Some causes of vaginal bleeding that do not result in miscarriage include: °· Having sex. °· Having an infection. °· Normal hormone changes of pregnancy. °· Bleeding that occurs when an egg implants in your uterus. °RISK FACTORS °Risk factors for bleeding in early pregnancy include: °· Obesity. °· Smoking. °· Drinking excessive amounts of alcohol or caffeine. °· Recreational drug use. °SIGNS AND SYMPTOMS °· Light vaginal bleeding. °· Mild abdominal pain or cramps. °DIAGNOSIS  °If you have bleeding with or without abdominal pain before 20 weeks of pregnancy, your health care provider will do tests to check whether you are still pregnant. One important test involves using sound waves and a computer (ultrasound) to create images of the inside of your uterus. Other tests include an internal exam of your vagina and uterus (pelvic exam) and measurement of your baby's heart rate.  °You may be diagnosed with a threatened miscarriage if: °· Ultrasound testing shows you are still pregnant. °· Your baby's heart rate is strong. °· A pelvic exam shows that the opening between your uterus and your vagina (cervix) is closed. °· Your heart rate and blood pressure are stable. °· Blood tests confirm you are still pregnant. °TREATMENT  °No treatments have been shown to prevent a threatened miscarriage from going on to a complete miscarriage. However, the right home care is important.  °HOME CARE INSTRUCTIONS  °· Make sure you keep all your appointments for prenatal care. This is very important. °· Get plenty of rest. °· Do not have sex or use tampons if you have vaginal bleeding. °· Do not douche. °· Do not smoke or use recreational drugs. °· Do not drink alcohol. °· Avoid  caffeine. °SEEK MEDICAL CARE IF: °· You have light vaginal bleeding or spotting while pregnant. °· You have abdominal pain or cramping. °· You have a fever. °SEEK IMMEDIATE MEDICAL CARE IF: °· You have heavy vaginal bleeding. °· You have blood clots coming from your vagina. °· You have severe low back pain or abdominal cramps. °· You have fever, chills, and severe abdominal pain. °MAKE SURE YOU: °· Understand these instructions. °· Will watch your condition. °· Will get help right away if you are not doing well or get worse. °Document Released: 03/12/2005 Document Revised: 03/17/2013 Document Reviewed: 01/06/2013 °ExitCare® Patient Information ©2015 [employer]. This information is not intended to replace advice given to you by your health care provider. Make sure you discuss any questions you have with your health care provider. ° °Vaginal Bleeding During Pregnancy, First Trimester °A small amount of bleeding (spotting) from the vagina is relatively common in early pregnancy. It usually stops on its own. Various things may cause bleeding or spotting in early pregnancy. Some bleeding may be related to the pregnancy, and some may not. In most cases, the bleeding is normal and is not a problem. However, bleeding can also be a sign of something serious. Be sure to tell your health care provider about any vaginal bleeding right away. °Some possible causes of vaginal bleeding during the first trimester include: °· Infection or inflammation of the cervix. °· Growths (polyps) on the cervix. °· Miscarriage or threatened miscarriage. °· Pregnancy tissue has   developed outside of the uterus and in a fallopian tube (tubal pregnancy). °· Tiny cysts have developed in the uterus instead of pregnancy tissue (molar pregnancy). °HOME CARE INSTRUCTIONS  °Watch your condition for any changes. The following actions may help to lessen any discomfort you are feeling: °· Follow your health care provider's instructions for limiting your  activity. If your health care provider orders bed rest, you may need to stay in bed and only get up to use the bathroom. However, your health care provider may allow you to continue light activity. °· If needed, make plans for someone to help with your regular activities and responsibilities while you are on bed rest. °· Keep track of the number of pads you use each day, how often you change pads, and how soaked (saturated) they are. Write this down. °· Do not use tampons. Do not douche. °· Do not have sexual intercourse or orgasms until approved by your health care provider. °· If you pass any tissue from your vagina, save the tissue so you can show it to your health care provider. °· Only take over-the-counter or prescription medicines as directed by your health care provider. °· Do not take aspirin because it can make you bleed. °· Keep all follow-up appointments as directed by your health care provider. °SEEK MEDICAL CARE IF: °· You have any vaginal bleeding during any part of your pregnancy. °· You have cramps or labor pains. °· You have a fever, not controlled by medicine. °SEEK IMMEDIATE MEDICAL CARE IF:  °· You have severe cramps in your back or belly (abdomen). °· You pass large clots or tissue from your vagina. °· Your bleeding increases. °· You feel light-headed or weak, or you have fainting episodes. °· You have chills. °· You are leaking fluid or have a gush of fluid from your vagina. °· You pass out while having a bowel movement. °MAKE SURE YOU: °· Understand these instructions. °· Will watch your condition. °· Will get help right away if you are not doing well or get worse. °Document Released: 12/20/2004 Document Revised: 03/17/2013 Document Reviewed: 11/17/2012 °ExitCare® Patient Information ©2015 [employer]. This information is not intended to replace advice given to you by your health care provider. Make sure you discuss any questions you have with your health care provider. ° °Pelvic  Rest °Pelvic rest is sometimes recommended for women when:  °· The placenta is partially or completely covering the opening of the cervix (placenta previa). °· There is bleeding between the uterine wall and the amniotic sac in the first trimester (subchorionic hemorrhage). °· The cervix begins to open without labor starting (incompetent cervix, cervical insufficiency). °· The labor is too early (preterm labor). °HOME CARE INSTRUCTIONS °· Do not have sexual intercourse, stimulation, or an orgasm. °· Do not use tampons, douche, or put anything in the vagina. °· Do not lift anything over 10 pounds (4.5 kg). °· Avoid strenuous activity or straining your pelvic muscles. °SEEK MEDICAL CARE IF:  °· You have any vaginal bleeding during pregnancy. Treat this as a potential emergency. °· You have cramping pain felt low in the stomach (stronger than menstrual cramps). °· You notice vaginal discharge (watery, mucus, or bloody). °· You have a low, dull backache. °· There are regular contractions or uterine tightening. °SEEK IMMEDIATE MEDICAL CARE IF: °You have vaginal bleeding and have placenta previa.  °Document Released: 07/07/2010 Document Revised: 06/04/2011 Document Reviewed: 07/07/2010 °ExitCare® Patient Information ©2015 [employer]. This information is not intended to replace advice   given to you by your health care provider. Make sure you discuss any questions you have with your health care provider. ° °

## 2013-10-20 NOTE — MAU Provider Note (Signed)
Reviewed and agree with note and plan. V.Amery Minasyan, MD  

## 2013-10-20 NOTE — MAU Note (Signed)
Patient states she woke up this am with an "uncomfortable" feeling in her abdomen and bleeding on tissue with wiping.

## 2013-10-20 NOTE — MAU Provider Note (Signed)
History     CSN: [id number]  Arrival date and time: 10/20/13 4098 Provider notified: [phone] Orders placed in EPIC: 0945 Provider at bedside: 1045    Chief Complaint  Patient presents with  . Vaginal Bleeding  . Abdominal Pain   HPI  Ms. Kerry Chavez is 28 yo G3P1011 @ 5.[redacted] wks gestation presenting with complaints of spotting. She has a h/o spotting and been seen in [facility] and at [facility].  Her HCG level last week was 6893. She had a sono last week with a SIUP measuring 4+wks, no FHR noted.  Dx'd with BV at last ER visit, rx'd Metrogel; which caused vaginal spotting. She stopped taking the Metrogel.  Rx'd Flagyl last week, but has not taken it.  Primary provider at [facility] is Marlinda Mike, CNM.  Past Medical History  Diagnosis Date  . Asthma   . Depression     Past Surgical History  Procedure Laterality Date  . Tonsillectomy and adenoidectomy  1998  . Cesarean section      History reviewed. No pertinent family history.  History  Substance Use Topics  . Smoking status: Current Every Day Smoker -- 0.25 packs/day for 10 years    Types: Cigarettes  . Smokeless tobacco: Not on file  . Alcohol Use: Yes     Comment: LAST-  3 WEEKS AGO- TEQUILA    Allergies:  Allergies  Allergen Reactions  . Shellfish Allergy Swelling  . Ivp Dye [Iodinated Diagnostic Agents] Other (See Comments)    Unknown reaction  . Penicillins Hives and Swelling    REACTION: rash  . Sulfamethoxazole Hives and Swelling    REACTION: rash    Prescriptions prior to admission  Medication Sig Dispense Refill  . albuterol (PROVENTIL HFA;VENTOLIN HFA) 108 (90 BASE) MCG/ACT inhaler Inhale 2 puffs into the lungs every 4 (four) hours as needed for wheezing or shortness of breath. Pt states uses seasonally during winter.      . metroNIDAZOLE (FLAGYL) 500 MG tablet Take 500 mg by mouth 2 (two) times daily. Pt picked up prescription of 14 tablets (7 day course) on 10/17/2013. The pt has not yet started therapy.      .  metroNIDAZOLE (METROGEL VAGINAL) 0.75 % vaginal gel Place 1 Applicatorful vaginally 2 (two) times daily.  70 g  0  . EPINEPHrine (EPIPEN) 0.3 mg/0.3 mL DEVI Inject 0.3 mLs (0.3 mg total) into the muscle once.  1 Device  0    Review of Systems  Constitutional: Negative.   HENT: Negative.   Eyes: Negative.   Respiratory: Negative.   Cardiovascular: Negative.   Gastrointestinal: Negative.   Genitourinary:       Mild cramping that awakened her @ 0500, none now; scant brownish spotting (was red earlier today)  Musculoskeletal: Negative.   Skin: Negative.   Neurological: Negative.   Endo/Heme/Allergies: Negative.   Psychiatric/Behavioral: Negative.    Results for orders placed during the hospital encounter of 10/20/13 (from the past 24 hour(s))  URINALYSIS, ROUTINE W REFLEX MICROSCOPIC     Status: Abnormal   Collection Time    10/20/13  9:20 AM      Result Value Ref Range   Color, Urine YELLOW  YELLOW   APPearance CLEAR  CLEAR   Specific Gravity, Urine <1.005 (*) 1.005 - 1.030   pH 6.0  5.0 - 8.0   Glucose, UA NEGATIVE  NEGATIVE mg/dL   Hgb urine dipstick LARGE (*) NEGATIVE   Bilirubin Urine NEGATIVE  NEGATIVE   Ketones, ur NEGATIVE  NEGATIVE mg/dL   Protein, ur NEGATIVE  NEGATIVE mg/dL   Urobilinogen, UA 0.2  0.0 - 1.0 mg/dL   Nitrite NEGATIVE  NEGATIVE   Leukocytes, UA NEGATIVE  NEGATIVE  URINE MICROSCOPIC-ADD ON     Status: Abnormal   Collection Time    10/20/13  9:20 AM      Result Value Ref Range   Squamous Epithelial / LPF MANY (*) RARE   WBC, UA 0-2  <3 WBC/hpf   RBC / HPF 0-2  <3 RBC/hpf   Bacteria, UA RARE  RARE  HCG, QUANTITATIVE, PREGNANCY     Status: Abnormal   Collection Time    10/20/13  9:48 AM      Result Value Ref Range   hCG, Beta Chain, Quant, S 17594 (*) <5 mIU/mL   OB U/S <14 wks: Transvaginal CLINICAL DATA: Vaginal spotting, assess viability.  EXAM:  TRANSVAGINAL OB ULTRASOUND  TECHNIQUE:  Transvaginal ultrasound was performed for complete  evaluation of the  gestation as well as the maternal uterus, adnexal regions, and  pelvic cul-de-sac.  COMPARISON: October 09, 2013.  FINDINGS:  Intrauterine gestational sac: Visualized/normal in shape.  Yolk sac: Visualized.  Embryo: Visualized.  Cardiac Activity: Not visualized.  MSD: 10.5 mm 5 w 5 d  CRL: 3.3 mm 6 w 0 d [facility]  Maternal uterus/adnexae: No hemorrhage is noted. Left ovary appears  normal. Probable corpus luteum cyst seen in right ovary.  IMPRESSION:  Probable early intrauterine gestational sac with yolk sac and  probable embryo, but no cardiac activity yet visualized. Recommend  follow-up quantitative B-HCG levels and follow-up US in 14 days to  confirm and assess viability. This recommendation follows SRU  consensus guidelines: Diagnostic Criteria for Nonviable Pregnancy  Early in the First Trimester. Malva Limes Engl J Med 2013; [id number]; 369:1443-51.  Electronically Signed  By: Roque LiasJames Green M.D.  On: 10/20/2013 12:21   Physical Exam   Blood pressure 113/69, pulse 87, temperature 98.4 F (36.9 C), temperature source Oral, resp. rate 18, height 5' (1.524 m), weight 82.192 kg (181 lb 3.2 oz), last menstrual period 09/12/2013.  Physical Exam  Constitutional: She is oriented to person, place, and time. She appears well-developed and well-nourished.  HENT:  Head: Normocephalic and atraumatic.  Eyes: Pupils are equal, round, and reactive to light.  Neck: Normal range of motion. Neck supple.  Cardiovascular: Normal rate, regular rhythm and normal heart sounds.   Respiratory: Effort normal and breath sounds normal.  GI: Soft. Bowel sounds are normal.  Genitourinary: Uterus normal.  Small amount of brownish vaginal d/c; VE closed/thick/high/posterior/firm  Musculoskeletal: Normal range of motion.  Neurological: She is alert and oriented to person, place, and time.  Skin: Skin is warm and dry.  Psychiatric: She has a normal mood and affect. Her behavior is normal.  Judgment and thought content normal.    MAU Course  Procedures Beta HCG OB U/S <14 wks Assessment and Plan  28 yo G3P1011 @ 5.[redacted] wks gestation Bleeding in early pregnancy Threatened Miscarriage Bacterial Vaginosis - untreated  Discharge Home Bleeding precautions Threatened Miscarriage Precautions F/U Beta HCG and Ultrasound @ [facility] in 2 wks Call the office if sx's worsen and/or increased pain develops Start Flagyl regimen ASAP  *Dr. Juliene PinaMody notified of assessment and plan - agrees  Kenard GowerAWSON, Chanell Nadeau, M, MSN, CNM 10/20/2013, 10:58 AM

## 2013-10-23 ENCOUNTER — Ambulatory Visit: Payer: No Typology Code available for payment source | Admitting: Family Medicine

## 2013-10-23 ENCOUNTER — Ambulatory Visit (HOSPITAL_COMMUNITY): Payer: No Typology Code available for payment source

## 2013-11-07 ENCOUNTER — Encounter (HOSPITAL_COMMUNITY): Payer: Self-pay | Admitting: *Deleted

## 2013-11-07 ENCOUNTER — Inpatient Hospital Stay (HOSPITAL_COMMUNITY): Payer: No Typology Code available for payment source

## 2013-11-07 ENCOUNTER — Inpatient Hospital Stay (HOSPITAL_COMMUNITY)
Admission: AD | Admit: 2013-11-07 | Discharge: 2013-11-07 | Disposition: A | Discharge status: Home / Self Care | Payer: No Typology Code available for payment source | Source: Ambulatory Visit | Attending: Obstetrics | Admitting: Obstetrics

## 2013-11-07 DIAGNOSIS — O418X10 Other specified disorders of amniotic fluid and membranes, first trimester, not applicable or unspecified: Secondary | ICD-10-CM

## 2013-11-07 DIAGNOSIS — O26859 Spotting complicating pregnancy, unspecified trimester: Secondary | ICD-10-CM | POA: Diagnosis present

## 2013-11-07 DIAGNOSIS — O99330 Smoking (tobacco) complicating pregnancy, unspecified trimester: Secondary | ICD-10-CM | POA: Diagnosis not present

## 2013-11-07 DIAGNOSIS — O200 Threatened abortion: Secondary | ICD-10-CM | POA: Diagnosis not present

## 2013-11-07 DIAGNOSIS — O468X1 Other antepartum hemorrhage, first trimester: Secondary | ICD-10-CM

## 2013-11-07 LAB — URINALYSIS, ROUTINE W REFLEX MICROSCOPIC
BILIRUBIN URINE: NEGATIVE
Glucose, UA: NEGATIVE mg/dL
Ketones, ur: 15 mg/dL — AB
Leukocytes, UA: NEGATIVE
Nitrite: NEGATIVE
PH: 6 (ref 5.0–8.0)
Protein, ur: NEGATIVE mg/dL
SPECIFIC GRAVITY, URINE: 1.02 (ref 1.005–1.030)
Urobilinogen, UA: 0.2 mg/dL (ref 0.0–1.0)

## 2013-11-07 LAB — URINE MICROSCOPIC-ADD ON

## 2013-11-07 NOTE — MAU Provider Note (Signed)
History     CSN: [id number]  Arrival date and time: 11/07/13 0040 Provider first notified & verbal orders: 0207 Provider notified of results: 0400 Provider at bedside: 0415   Chief Complaint  Patient presents with  . Vaginal Bleeding  . Back Pain  . Abdominal Pain   HPI  Ms. 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  She had sono at [facility] on 8/6 where a [facility] measuring 1.3x0.5x1.0 cm and 2 Rt simple ovarian cysts were noted measuring 4.1x4.0 cm and 3.2x3.1 cm, respectively. She has a h/o Bixler, spotting earlier in the pregnancy; which she is used to.  The bright, red spotting frightened her tonight.  Mild abdominal cramping & low back pain. O positive.  Primary care provider at [facility] is Marlinda Mike, CNM.  Past Medical History  Diagnosis Date  . Asthma   . Depression     Past Surgical History  Procedure Laterality Date  . Tonsillectomy and adenoidectomy  1998  . Cesarean section      Family History  Problem Relation Age of Onset  . Diabetes Mother     History  Substance Use Topics  . Smoking status: Current Every Day Smoker -- 0.25 packs/day for 10 years    Types: Cigarettes  . Smokeless tobacco: Not on file  . Alcohol Use: Yes     Comment: not while pregnant    Allergies:  Allergies  Allergen Reactions  . Shellfish Allergy Swelling  . Ivp Dye [Iodinated Diagnostic Agents] Other (See Comments)    Unknown reaction  . Penicillins Hives and Swelling    REACTION: rash  . Sulfamethoxazole Hives and Swelling    REACTION: rash    Prescriptions prior to admission  Medication Sig Dispense Refill  . albuterol (PROVENTIL HFA;VENTOLIN HFA) 108 (90 BASE) MCG/ACT inhaler Inhale 2 puffs into the lungs every 4 (four) hours as needed for wheezing or shortness of breath. Pt states uses seasonally during winter.      . Prenatal Vit-Fe  Fumarate-FA (PRENATAL MULTIVITAMIN) TABS tablet Take 1 tablet by mouth daily at 12 noon.      Marland Kitchen EPINEPHrine (EPIPEN) 0.3 mg/0.3 mL DEVI Inject 0.3 mLs (0.3 mg total) into the muscle once.  1 Device  0  . metroNIDAZOLE (FLAGYL) 500 MG tablet Take 500 mg by mouth 2 (two) times daily. Pt picked up prescription of 14 tablets (7 day course) on 10/17/2013. The pt has not yet started therapy.        Review of Systems  Constitutional: Negative.   HENT: Negative.   Eyes: Negative.   Respiratory: Negative.   Cardiovascular: Negative.   Gastrointestinal: Negative.   Genitourinary: Negative.        Bright, red vaginal spotting  Musculoskeletal: Negative.   Skin: Negative.   Neurological: Negative.   Endo/Heme/Allergies: Negative.   Psychiatric/Behavioral: Negative.    US Ob Transvaginal  11/07/2013   CLINICAL DATA:  Continuous vaginal bleeding.  EXAM: TRANSVAGINAL OB ULTRASOUND  TECHNIQUE: Transvaginal ultrasound was performed for complete evaluation of the gestation as well as the maternal uterus, adnexal regions, and pelvic cul-de-sac.  COMPARISON:  None.  FINDINGS: Intrauterine gestational sac: Visualized/normal in shape.  Yolk sac:  Yes  Embryo:  Yes  Cardiac Activity: Yes  Heart Rate: 152 bpm  CRL:   1.72 cm   8 w 2 d  US EDC: 06/17/2014  Maternal uterus/adnexae: A moderate amount of subchorionic hemorrhage is noted. The uterus is otherwise unremarkable.  Two right ovarian cysts are seen, measuring 3.6 cm and 2.5 cm in size. These are likely physiologic. The left ovary is unremarkable in appearance. The right ovary measures approximately 5.7 x 3.6 x 3.6 cm, while the left ovary measures 3.0 x 2.2 x 1.8 cm. No suspicious adnexal masses are seen; there is no evidence for ovarian torsion.  No free fluid is seen within the pelvic cul-de-sac.  IMPRESSION: 1. Single live intrauterine pregnancy noted, with a crown-rump length of 1.7 cm, corresponding to a gestational age of [redacted] weeks 2 days. This  matches the gestational age of [redacted] weeks 0 days by LMP, reflecting an estimated date of delivery of June 19, 2014. 2. Moderate amount of subchorionic hemorrhage noted. 3. Likely physiologic right ovarian cysts noted.   Electronically Signed   By: Roanna RaiderJeffery  Chang M.D.   On: 11/07/2013 03:41   Physical Exam   Blood pressure 116/70, pulse 85, temperature 98.4 F (36.9 C), resp. rate 20, height 5\' 1"  (1.549 m), weight 84.097 kg (185 lb 6.4 oz), last menstrual period 09/12/2013.  Physical Exam  Constitutional: She is oriented to person, place, and time. She appears well-developed and well-nourished.  HENT:  Head: Normocephalic and atraumatic.  Eyes: Pupils are equal, round, and reactive to light.  Neck: Normal range of motion. Neck supple.  Cardiovascular: Normal rate, regular rhythm and normal heart sounds.   Respiratory: Effort normal and breath sounds normal.  GI: Soft. Bowel sounds are normal.  Genitourinary: Pelvic exam was performed with patient supine. Uterus is enlarged. Cervix exhibits no motion tenderness, no discharge and no friability. Right adnexum displays no mass and no tenderness. Left adnexum displays no mass, no tenderness and no fullness. Vaginal discharge found.  Gravid, S=D, Scant, slight tannish colored d/c noted in vaginal vault / Cx: closed/long/firm/posterior  Musculoskeletal: Normal range of motion.  Neurological: She is alert and oriented to person, place, and time. She has normal reflexes.  Skin: Skin is warm and dry.  Psychiatric: She has a normal mood and affect. Her behavior is normal. Judgment and thought content normal.    MAU Course  Procedures OB complete U/S ,14 wks Assessment and Plan  28 yo G3P1011 @ [redacted] weeks gestation Subchorionic Hemorrhage Threatened Miscarriage Bleeding in pregnancy, first trimester  Discharge home Bleeding precautions given Pelvic Rest Call the office, if symptoms worsen Keep scheduled appointment on Monday 11/09/2013  Raelyn MoraAWSON,  Rockne Dearinger, Judie PetitM MSN, CNM 11/07/2013, 4:34 AM

## 2013-11-07 NOTE — Progress Notes (Signed)
U/S report called to R. Arita Missawson CNM. Will come see pt. Pt aware

## 2013-11-07 NOTE — MAU Note (Signed)
Had some bright bleeding about 2400 on panties. Not much now. Was treated for BV. Occ back pain

## 2013-11-07 NOTE — Discharge Instructions (Signed)
Subchorionic Hematoma °A subchorionic hematoma is a gathering of blood between the outer wall of the placenta and the inner wall of the womb (uterus). The placenta is the organ that connects the fetus to the wall of the uterus. The placenta performs the feeding, breathing (oxygen to the fetus), and waste removal (excretory work) of the fetus.  °Subchorionic hematoma is the most common abnormality found on a result from ultrasonography done during the first trimester or early second trimester of pregnancy. If there has been little or no vaginal bleeding, early small hematomas usually shrink on their own and do not affect your baby or pregnancy. The blood is gradually absorbed over 1-2 weeks. When bleeding starts later in pregnancy or the hematoma is larger or occurs in an older pregnant woman, the outcome may not be as good. Larger hematomas may get bigger, which increases the chances for miscarriage. Subchorionic hematoma also increases the risk of premature detachment of the placenta from the uterus, preterm (premature) labor, and stillbirth. °HOME CARE INSTRUCTIONS °· Stay on bed rest if your health care provider recommends this. Although bed rest will not prevent more bleeding or prevent a miscarriage, your health care provider may recommend bed rest until you are advised otherwise. °· Avoid heavy lifting (more than 10 lb [4.5 kg]), exercise, sexual intercourse, or douching as directed by your health care provider. °· Keep track of the number of pads you use each day and how soaked (saturated) they are. Write down this information. °· Do not use tampons. °· Keep all follow-up appointments as directed by your health care provider. Your health care provider may ask you to have follow-up blood tests or ultrasound tests or both. °SEEK IMMEDIATE MEDICAL CARE IF: °· You have severe cramps in your stomach, back, abdomen, or pelvis. °· You have a fever. °· You pass large clots or tissue. Save any tissue for your health  care provider to look at. °· Your bleeding increases or you become lightheaded, feel weak, or have fainting episodes. °Document Released: 06/27/2006 Document Revised: 07/27/2013 Document Reviewed: 10/09/2012 °ExitCare® Patient Information ©2015 [employer]. This information is not intended to replace advice given to you by your health care provider. Make sure you discuss any questions you have with your health care provider. ° °Pelvic Rest °Pelvic rest is sometimes recommended for women when:  °· The placenta is partially or completely covering the opening of the cervix (placenta previa). °· There is bleeding between the uterine wall and the amniotic sac in the first trimester (subchorionic hemorrhage). °· The cervix begins to open without labor starting (incompetent cervix, cervical insufficiency). °· The labor is too early (preterm labor). °HOME CARE INSTRUCTIONS °· Do not have sexual intercourse, stimulation, or an orgasm. °· Do not use tampons, douche, or put anything in the vagina. °· Do not lift anything over 10 pounds (4.5 kg). °· Avoid strenuous activity or straining your pelvic muscles. °SEEK MEDICAL CARE IF:  °· You have any vaginal bleeding during pregnancy. Treat this as a potential emergency. °· You have cramping pain felt low in the stomach (stronger than menstrual cramps). °· You notice vaginal discharge (watery, mucus, or bloody). °· You have a low, dull backache. °· There are regular contractions or uterine tightening. °SEEK IMMEDIATE MEDICAL CARE IF: °You have vaginal bleeding and have placenta previa.  °Document Released: 07/07/2010 Document Revised: 06/04/2011 Document Reviewed: 07/07/2010 °ExitCare® Patient Information ©2015 [employer]. This information is not intended to replace advice given to you by your health care provider.   Make sure you discuss any questions you have with your health care provider.  Threatened Miscarriage A threatened miscarriage occurs when you have vaginal  bleeding during your first 20 weeks of pregnancy but the pregnancy has not ended. If you have vaginal bleeding during this time, your health care provider will do tests to make sure you are still pregnant. If the tests show you are still pregnant and the developing baby (fetus) inside your womb (uterus) is still growing, your condition is considered a threatened miscarriage. A threatened miscarriage does not mean your pregnancy will end, but it does increase the risk of losing your pregnancy (complete miscarriage). CAUSES  The cause of a threatened miscarriage is usually not known. If you go on to have a complete miscarriage, the most common cause is an abnormal number of chromosomes in the developing baby. Chromosomes are the structures inside cells that hold all your genetic material. Some causes of vaginal bleeding that do not result in miscarriage include:  Having sex.  Having an infection.  Normal hormone changes of pregnancy.  Bleeding that occurs when an egg implants in your uterus. RISK FACTORS Risk factors for bleeding in early pregnancy include:  Obesity.  Smoking.  Drinking excessive amounts of alcohol or caffeine.  Recreational drug use. SIGNS AND SYMPTOMS  Light vaginal bleeding.  Mild abdominal pain or cramps. DIAGNOSIS  If you have bleeding with or without abdominal pain before 20 weeks of pregnancy, your health care provider will do tests to check whether you are still pregnant. One important test involves using sound waves and a computer (ultrasound) to create images of the inside of your uterus. Other tests include an internal exam of your vagina and uterus (pelvic exam) and measurement of your baby's heart rate.  You may be diagnosed with a threatened miscarriage if:  Ultrasound testing shows you are still pregnant.  Your baby's heart rate is strong.  A pelvic exam shows that the opening between your uterus and your vagina (cervix) is closed.  Your heart rate  and blood pressure are stable.  Blood tests confirm you are still pregnant. TREATMENT  No treatments have been shown to prevent a threatened miscarriage from going on to a complete miscarriage. However, the right home care is important.  HOME CARE INSTRUCTIONS   Make sure you keep all your appointments for prenatal care. This is very important.  Get plenty of rest.  Do not have sex or use tampons if you have vaginal bleeding.  Do not douche.  Do not smoke or use recreational drugs.  Do not drink alcohol.  Avoid caffeine. SEEK MEDICAL CARE IF:  You have light vaginal bleeding or spotting while pregnant.  You have abdominal pain or cramping.  You have a fever. SEEK IMMEDIATE MEDICAL CARE IF:  You have heavy vaginal bleeding.  You have blood clots coming from your vagina.  You have severe low back pain or abdominal cramps.  You have fever, chills, and severe abdominal pain. MAKE SURE YOU:  Understand these instructions.  Will watch your condition.  Will get help right away if you are not doing well or get worse. Document Released: 03/12/2005 Document Revised: 03/17/2013 Document Reviewed: 01/06/2013 [facility] Patient Information 2015 [place], [place]. This information is not intended to replace advice given to you by your health care provider. Make sure you discuss any questions you have with your health care provider.  Vaginal Bleeding During Pregnancy, First Trimester A small amount of bleeding (spotting) from the vagina is common  in early pregnancy. Sometimes the bleeding is normal and is not a problem, and sometimes it is a sign of something serious. Be sure to tell your doctor about any bleeding from your vagina right away. HOME CARE  Watch your condition for any changes.  Follow your doctor's instructions about how active you can be.  If you are on bed rest:  You may need to stay in bed and only get up to use the bathroom.  You may be allowed to do some  activities.  If you need help, make plans for someone to help you.  Write down:  The number of pads you use each day.  How often you change pads.  How soaked (saturated) your pads are.  Do not use tampons.  Do not douche.  Do not have sex or orgasms until your doctor says it is okay.  If you pass any tissue from your vagina, save the tissue so you can show it to your doctor.  Only take medicines as told by your doctor.  Do not take aspirin because it can make you bleed.  Keep all follow-up visits as told by your doctor. GET HELP IF:   You bleed from your vagina.  You have cramps.  You have labor pains.  You have a fever that does not go away after you take medicine. GET HELP RIGHT AWAY IF:   You have very bad cramps in your back or belly (abdomen).  You pass large clots or tissue from your vagina.  You bleed more.  You feel light-headed or weak.  You pass out (faint).  You have chills.  You are leaking fluid or have a gush of fluid from your vagina.  You pass out while pooping (having a bowel movement). MAKE SURE YOU:  Understand these instructions.  Will watch your condition.  Will get help right away if you are not doing well or get worse. Document Released: 07/27/2013 Document Reviewed: 11/17/2012 [facility] Patient Information 2015 [place]. This information is not intended to replace advice given to you by your health care provider. Make sure you discuss any questions you have with your health care provider.

## 2013-11-07 NOTE — Progress Notes (Signed)
Rolitta Dawson CNM in to discuss test results and d/c plan. Written and verbal d/c instructions given and understanding voiced. 

## 2013-11-07 NOTE — Progress Notes (Signed)
Carloyn Jaeger. Dawson CNM notified of pt's admission and status. Will order u/s

## 2014-01-25 ENCOUNTER — Encounter (HOSPITAL_COMMUNITY): Payer: Self-pay | Admitting: *Deleted

## 2014-03-08 ENCOUNTER — Inpatient Hospital Stay (HOSPITAL_COMMUNITY)
Admission: AD | Admit: 2014-03-08 | Discharge: 2014-03-08 | Disposition: A | Discharge status: Home / Self Care | Payer: No Typology Code available for payment source | Source: Ambulatory Visit | Attending: Obstetrics & Gynecology | Admitting: Obstetrics & Gynecology

## 2014-03-08 ENCOUNTER — Encounter (HOSPITAL_COMMUNITY): Payer: Self-pay | Admitting: *Deleted

## 2014-03-08 DIAGNOSIS — O99342 Other mental disorders complicating pregnancy, second trimester: Secondary | ICD-10-CM | POA: Diagnosis not present

## 2014-03-08 DIAGNOSIS — O99332 Smoking (tobacco) complicating pregnancy, second trimester: Secondary | ICD-10-CM | POA: Insufficient documentation

## 2014-03-08 DIAGNOSIS — O26812 Pregnancy related exhaustion and fatigue, second trimester: Secondary | ICD-10-CM | POA: Insufficient documentation

## 2014-03-08 DIAGNOSIS — F4321 Adjustment disorder with depressed mood: Secondary | ICD-10-CM | POA: Diagnosis not present

## 2014-03-08 DIAGNOSIS — F17210 Nicotine dependence, cigarettes, uncomplicated: Secondary | ICD-10-CM | POA: Diagnosis not present

## 2014-03-08 DIAGNOSIS — Z3A25 25 weeks gestation of pregnancy: Secondary | ICD-10-CM | POA: Insufficient documentation

## 2014-03-08 DIAGNOSIS — R112 Nausea with vomiting, unspecified: Secondary | ICD-10-CM | POA: Diagnosis present

## 2014-03-08 DIAGNOSIS — R630 Anorexia: Secondary | ICD-10-CM | POA: Insufficient documentation

## 2014-03-08 DIAGNOSIS — O212 Late vomiting of pregnancy: Secondary | ICD-10-CM | POA: Diagnosis not present

## 2014-03-08 LAB — COMPREHENSIVE METABOLIC PANEL
ALT: 24 U/L (ref 0–35)
AST: 18 U/L (ref 0–37)
Albumin: 2.6 g/dL — ABNORMAL LOW (ref 3.5–5.2)
Alkaline Phosphatase: 68 U/L (ref 39–117)
Anion gap: 12 (ref 5–15)
BUN: 5 mg/dL — ABNORMAL LOW (ref 6–23)
CO2: 21 mEq/L (ref 19–32)
Calcium: 8.4 mg/dL (ref 8.4–10.5)
Chloride: 105 mEq/L (ref 96–112)
Creatinine, Ser: 0.42 mg/dL — ABNORMAL LOW (ref 0.50–1.10)
GFR calc Af Amer: >90 mL/min (ref >90)
GFR calc non Af Amer: >90 mL/min (ref >90)
Glucose, Bld: 101 mg/dL — ABNORMAL HIGH (ref 70–99)
Potassium: 3.7 mEq/L (ref 3.7–5.3)
Sodium: 138 mEq/L (ref 137–147)
Total Bilirubin: <0.2 mg/dL — ABNORMAL LOW (ref 0.3–1.2)
Total Protein: 5.6 g/dL — ABNORMAL LOW (ref 6.0–8.3)

## 2014-03-08 LAB — URINALYSIS, ROUTINE W REFLEX MICROSCOPIC
Bilirubin Urine: NEGATIVE
Glucose, UA: NEGATIVE mg/dL
Ketones, ur: NEGATIVE mg/dL
Leukocytes, UA: NEGATIVE
Nitrite: NEGATIVE
Protein, ur: NEGATIVE mg/dL
Specific Gravity, Urine: 1.02 (ref 1.005–1.030)
Urobilinogen, UA: 0.2 mg/dL (ref 0.0–1.0)
pH: 7 (ref 5.0–8.0)

## 2014-03-08 LAB — URINE MICROSCOPIC-ADD ON

## 2014-03-08 MED ORDER — LACTATED RINGERS IV BOLUS (SEPSIS)
500.0000 mL | Freq: Once | INTRAVENOUS | Status: AC
  Administered 2014-03-08: 500 mL via INTRAVENOUS

## 2014-03-08 MED ORDER — LACTATED RINGERS IV SOLN
INTRAVENOUS | Status: DC
  Administered 2014-03-08: 15:00:00 via INTRAVENOUS

## 2014-03-08 MED ORDER — RANITIDINE HCL 150 MG PO TABS: 150.0000 mg | ORAL_TABLET | Freq: Two times a day (BID) | ORAL | Status: DC

## 2014-03-08 MED ORDER — NALBUPHINE HCL 10 MG/ML IJ SOLN
10.0000 mg | INTRAMUSCULAR | Status: AC
  Administered 2014-03-08: 10 mg via INTRAVENOUS
  Filled 2014-03-08: qty 1

## 2014-03-08 MED ORDER — FAMOTIDINE IN NACL 20-0.9 MG/50ML-% IV SOLN
20.0000 mg | Freq: Once | INTRAVENOUS | Status: AC
Start: 2014-03-08 — End: 2014-03-08
  Administered 2014-03-08: 20 mg via INTRAVENOUS
  Filled 2014-03-08: qty 50

## 2014-03-08 MED ORDER — BUPROPION HCL ER (XL) 150 MG PO TB24: 150.0000 mg | ORAL_TABLET | ORAL | Status: DC

## 2014-03-08 NOTE — MAU Note (Signed)
C/o nausea, weak, and dizzy spells for past 2 days;

## 2014-03-08 NOTE — MAU Provider Note (Signed)
History     CSN: [id number]  Arrival date and time: 03/08/14 1143 Orders given to nurse @ 1300 Provider here to see patient @ 1555    Chief Complaint  Patient presents with  . Nausea  . Fatigue  . Dizziness   HPI  Headache - dizziness and fatigue Increased stress with job and recent death of father Daily nausea with vomiting Not eating much due to nausea and stress Cant stop crying - worried about smoking more while pregnant  Past Medical History  Diagnosis Date  . Asthma   . Depression     Past Surgical History  Procedure Laterality Date  . Tonsillectomy and adenoidectomy  1998  . Cesarean section      Family History  Problem Relation Age of Onset  . Diabetes Mother     History  Substance Use Topics  . Smoking status: Current Every Day Smoker -- 0.25 packs/day for 10 years    Types: Cigarettes  . Smokeless tobacco: Not on file  . Alcohol Use: Yes     Comment: not while pregnant    Allergies:  Allergies  Allergen Reactions  . Shellfish Allergy Swelling  . Ivp Dye [Iodinated Diagnostic Agents] Other (See Comments)    Unknown reaction  . Penicillins Hives and Swelling    REACTION: rash  . Sulfamethoxazole Hives and Swelling    REACTION: rash    Prescriptions prior to admission  Medication Sig Dispense Refill Last Dose  . acetaminophen (TYLENOL) 325 MG tablet Take 325 mg by mouth every 4 (four) hours as needed for mild pain or moderate pain.   03/07/2014 at Unknown time  . albuterol (PROVENTIL HFA;VENTOLIN HFA) 108 (90 BASE) MCG/ACT inhaler Inhale 2 puffs into the lungs every 4 (four) hours as needed for wheezing or shortness of breath. Pt states uses seasonally during winter.   Past Month at Unknown time  . EPINEPHrine (EPIPEN) 0.3 mg/0.3 mL DEVI Inject 0.3 mLs (0.3 mg total) into the muscle once. 1 Device 0 More than a month at Unknown time  . Prenatal Vit-Fe Fumarate-FA (PRENATAL MULTIVITAMIN) TABS tablet Take 1 tablet by mouth daily at 12 noon.    03/01/2014    ROS   Dizziness and lightheaded at work No food today due to nausea Persistent fatigue and headache daily Vomiting 2-3 times per day Active FM No ctx or bleeding or cramping  Physical Exam   Blood pressure 106/61, pulse 85, temperature 99 F (37.2 C), temperature source Oral, resp. rate 16, height 5\' 2"  (1.575 m), weight 83.462 kg (184 lb), last menstrual period 09/12/2013.  Physical Exam  Alert and oriented - tearful Abdomen soft and non-tender FHR (+) Defer pelvic exam  Labs: urinalysis           CMP - normal BUN and creatinine / LE normal / glucose 101  MAU Course  Procedures  IV hydration x 1 liter with Pepcid 20mg  IV Nubain for headache  Assessment and Plan  [redacted] weeks pregnant Dehydration secondary to nausea and vomiting - suspect stress and reflux as etiology for nausea Anorexia / tearful / fatigue likely related to depression - acute grief reaction with loss of father  DC home Start Zantac 150 BID Start Wellbutrin for anxiety/ depression - assistance with smoking cessation [facility] Reviewed daily diet instructions - meals x 3 with 3 snacks between meals                                                      -  4 water bottles per day in addition to fluid of choice at meals  Marlinda MikeBAILEY, Ahmari Garton 03/08/2014, 4:10 PM

## 2014-03-08 NOTE — MAU Note (Signed)
Urine in lab 

## 2014-05-13 ENCOUNTER — Other Ambulatory Visit: Payer: Self-pay | Admitting: Obstetrics and Gynecology

## 2014-05-14 ENCOUNTER — Other Ambulatory Visit: Payer: Self-pay | Admitting: Obstetrics and Gynecology

## 2014-06-11 ENCOUNTER — Encounter (HOSPITAL_COMMUNITY): Payer: Self-pay

## 2014-06-11 ENCOUNTER — Encounter (HOSPITAL_COMMUNITY)
Admission: RE | Admit: 2014-06-11 | Discharge: 2014-06-11 | Disposition: A | Discharge status: Home / Self Care | Payer: No Typology Code available for payment source | Source: Ambulatory Visit | Attending: Obstetrics and Gynecology | Admitting: Obstetrics and Gynecology

## 2014-06-11 LAB — CBC
HEMATOCRIT: 34.4 % — AB (ref 36.0–46.0)
HEMOGLOBIN: 12 g/dL (ref 12.0–15.0)
MCH: 32.9 pg (ref 26.0–34.0)
MCHC: 34.9 g/dL (ref 30.0–36.0)
MCV: 94.2 fL (ref 78.0–100.0)
PLATELETS: 224 10*3/uL (ref 150–400)
RBC: 3.65 MIL/uL — AB (ref 3.87–5.11)
RDW: 14.5 % (ref 11.5–15.5)
WBC: 14.4 10*3/uL — AB (ref 4.0–10.5)

## 2014-06-11 LAB — TYPE AND SCREEN
ABO/RH(D): O POS
Antibody Screen: NEGATIVE

## 2014-06-11 MED ORDER — DEXTROSE 5 % IV SOLN
INTRAVENOUS | Status: AC
  Administered 2014-06-12: 114 mL via INTRAVENOUS
  Filled 2014-06-11: qty 8

## 2014-06-11 NOTE — Anesthesia Preprocedure Evaluation (Addendum)
Anesthesia Evaluation  Patient identified by MRN, date of birth, ID band Patient awake    Reviewed: Allergy & Precautions, NPO status , Patient's Chart, lab work & pertinent test results  History of Anesthesia Complications Negative for: history of anesthetic complications  Airway Mallampati: II  TM Distance: >3 FB Neck ROM: Full    Dental no notable dental hx. (+) Dental Advisory Given   Pulmonary asthma , Current Smoker,  breath sounds clear to auscultation  Pulmonary exam normal       Cardiovascular negative cardio ROS  Rhythm:Regular Rate:Normal     Neuro/Psych PSYCHIATRIC DISORDERS Anxiety Depression negative neurological ROS     GI/Hepatic negative GI ROS, Neg liver ROS,   Endo/Other  negative endocrine ROS  Renal/GU negative Renal ROS  negative genitourinary   Musculoskeletal negative musculoskeletal ROS (+)   Abdominal   Peds negative pediatric ROS (+)  Hematology negative hematology ROS (+)   Anesthesia Other Findings   Reproductive/Obstetrics (+) Pregnancy                            Anesthesia Physical Anesthesia Plan  ASA: II  Anesthesia Plan: Epidural   Post-op Pain Management:    Induction:   Airway Management Planned:   Additional Equipment:   Intra-op Plan:   Post-operative Plan:   Informed Consent: I have reviewed the patients History and Physical, chart, labs and discussed the procedure including the risks, benefits and alternatives for the proposed anesthesia with the patient or authorized representative who has indicated his/her understanding and acceptance.   Dental advisory given  Plan Discussed with: CRNA  Anesthesia Plan Comments:         Anesthesia Quick Evaluation

## 2014-06-11 NOTE — Patient Instructions (Addendum)
   Your procedure is scheduled on: MARCH 19 AT 915AM  [facility]: 730[facility] Pick up the phone at the desk and dial [phone] and inform us of your arrival.  Please call this number if you have any problems the morning of surgery: [phone]  Remember: Do not eat food after midnight:MARCH 18 Do not drink clear liquids after: MARCH 18 Take these medicines the morning of surgery with a SIP OF WATER: take zantac day of surgery  Do not wear jewelry, make-up, or FINGER nail polish No metal in your hair or on your body. Do not wear lotions, powders, perfumes.  You may wear deodorant.  Do not bring valuables to the hospital. Contacts, dentures or bridgework may not be worn into surgery.  Leave suitcase in the car. After Surgery it may be brought to your room. For patients being admitted to the hospital, checkout time is 11:00am the day of discharge.    Patients discharged on the day of surgery will not be allowed to drive home.

## 2014-06-12 ENCOUNTER — Inpatient Hospital Stay (HOSPITAL_COMMUNITY)
Admission: RE | Admit: 2014-06-12 | Discharge: 2014-06-14 | DRG: 765 | Disposition: A | Discharge status: Home / Self Care | Payer: No Typology Code available for payment source | Source: Ambulatory Visit | Attending: Obstetrics and Gynecology | Admitting: Obstetrics and Gynecology

## 2014-06-12 ENCOUNTER — Encounter (HOSPITAL_COMMUNITY): Payer: Self-pay

## 2014-06-12 ENCOUNTER — Encounter (HOSPITAL_COMMUNITY)
Admission: RE | Disposition: A | Discharge status: Home / Self Care | Payer: Self-pay | Source: Ambulatory Visit | Attending: Obstetrics and Gynecology

## 2014-06-12 ENCOUNTER — Inpatient Hospital Stay (HOSPITAL_COMMUNITY): Payer: No Typology Code available for payment source | Admitting: Anesthesiology

## 2014-06-12 DIAGNOSIS — O3421 Maternal care for scar from previous cesarean delivery: Secondary | ICD-10-CM | POA: Diagnosis present

## 2014-06-12 DIAGNOSIS — Z91040 Latex allergy status: Secondary | ICD-10-CM

## 2014-06-12 DIAGNOSIS — O99824 Streptococcus B carrier state complicating childbirth: Secondary | ICD-10-CM | POA: Diagnosis present

## 2014-06-12 DIAGNOSIS — F329 Major depressive disorder, single episode, unspecified: Secondary | ICD-10-CM | POA: Diagnosis present

## 2014-06-12 DIAGNOSIS — O99344 Other mental disorders complicating childbirth: Secondary | ICD-10-CM | POA: Diagnosis present

## 2014-06-12 DIAGNOSIS — Z882 Allergy status to sulfonamides status: Secondary | ICD-10-CM

## 2014-06-12 DIAGNOSIS — F17210 Nicotine dependence, cigarettes, uncomplicated: Secondary | ICD-10-CM | POA: Diagnosis present

## 2014-06-12 DIAGNOSIS — J45909 Unspecified asthma, uncomplicated: Secondary | ICD-10-CM | POA: Diagnosis present

## 2014-06-12 DIAGNOSIS — Z3A39 39 weeks gestation of pregnancy: Secondary | ICD-10-CM | POA: Diagnosis present

## 2014-06-12 DIAGNOSIS — D62 Acute posthemorrhagic anemia: Secondary | ICD-10-CM | POA: Diagnosis not present

## 2014-06-12 DIAGNOSIS — Z91041 Radiographic dye allergy status: Secondary | ICD-10-CM | POA: Diagnosis not present

## 2014-06-12 DIAGNOSIS — O2613 Low weight gain in pregnancy, third trimester: Secondary | ICD-10-CM | POA: Diagnosis present

## 2014-06-12 DIAGNOSIS — Z880 Allergy status to penicillin: Secondary | ICD-10-CM

## 2014-06-12 DIAGNOSIS — O9952 Diseases of the respiratory system complicating childbirth: Secondary | ICD-10-CM | POA: Diagnosis present

## 2014-06-12 DIAGNOSIS — O9081 Anemia of the puerperium: Secondary | ICD-10-CM | POA: Diagnosis not present

## 2014-06-12 DIAGNOSIS — O99334 Smoking (tobacco) complicating childbirth: Secondary | ICD-10-CM | POA: Diagnosis present

## 2014-06-12 DIAGNOSIS — Z302 Encounter for sterilization: Secondary | ICD-10-CM | POA: Diagnosis not present

## 2014-06-12 DIAGNOSIS — Z91013 Allergy to seafood: Secondary | ICD-10-CM

## 2014-06-12 DIAGNOSIS — N858 Other specified noninflammatory disorders of uterus: Secondary | ICD-10-CM | POA: Diagnosis present

## 2014-06-12 LAB — RPR: RPR Ser Ql: NONREACTIVE

## 2014-06-12 SURGERY — Surgical Case
Anesthesia: Epidural | Site: Abdomen | Laterality: Bilateral

## 2014-06-12 MED ORDER — SENNOSIDES-DOCUSATE SODIUM 8.6-50 MG PO TABS
2.0000 | ORAL_TABLET | ORAL | Status: DC
  Administered 2014-06-12 – 2014-06-13 (×2): 2 via ORAL
  Filled 2014-06-12 (×2): qty 2

## 2014-06-12 MED ORDER — NALOXONE HCL 1 MG/ML IJ SOLN
1.0000 ug/kg/h | INTRAVENOUS | Status: DC | PRN
  Filled 2014-06-12: qty 2

## 2014-06-12 MED ORDER — MENTHOL 3 MG MT LOZG: 1.0000 | LOZENGE | OROMUCOSAL | Status: DC | PRN

## 2014-06-12 MED ORDER — SCOPOLAMINE 1 MG/3DAYS TD PT72
1.0000 | MEDICATED_PATCH | Freq: Once | TRANSDERMAL | Status: DC
  Administered 2014-06-12: 1.5 mg via TRANSDERMAL

## 2014-06-12 MED ORDER — NALOXONE HCL 0.4 MG/ML IJ SOLN
0.4000 mg | INTRAMUSCULAR | Status: DC | PRN
Start: 2014-06-12 — End: 2014-06-13

## 2014-06-12 MED ORDER — KETOROLAC TROMETHAMINE 30 MG/ML IJ SOLN: 30.0000 mg | Freq: Four times a day (QID) | INTRAMUSCULAR | Status: DC | PRN

## 2014-06-12 MED ORDER — LACTATED RINGERS IV SOLN
INTRAVENOUS | Status: DC
  Administered 2014-06-12: 22:00:00 via INTRAVENOUS

## 2014-06-12 MED ORDER — SIMETHICONE 80 MG PO CHEW
80.0000 mg | CHEWABLE_TABLET | ORAL | Status: DC | PRN
  Filled 2014-06-12: qty 1

## 2014-06-12 MED ORDER — IBUPROFEN 600 MG PO TABS
600.0000 mg | ORAL_TABLET | Freq: Four times a day (QID) | ORAL | Status: DC
  Administered 2014-06-12 – 2014-06-14 (×7): 600 mg via ORAL
  Filled 2014-06-12 (×8): qty 1

## 2014-06-12 MED ORDER — ONDANSETRON HCL 4 MG/2ML IJ SOLN
INTRAMUSCULAR | Status: AC
  Filled 2014-06-12: qty 2

## 2014-06-12 MED ORDER — KETOROLAC TROMETHAMINE 30 MG/ML IJ SOLN
INTRAMUSCULAR | Status: AC
  Filled 2014-06-12: qty 1

## 2014-06-12 MED ORDER — OXYCODONE-ACETAMINOPHEN 5-325 MG PO TABS
2.0000 | ORAL_TABLET | ORAL | Status: DC | PRN
  Administered 2014-06-13 – 2014-06-14 (×3): 2 via ORAL
  Filled 2014-06-12 (×2): qty 2

## 2014-06-12 MED ORDER — NALBUPHINE HCL 10 MG/ML IJ SOLN: 5.0000 mg | INTRAMUSCULAR | Status: DC | PRN

## 2014-06-12 MED ORDER — ACETAMINOPHEN 500 MG PO TABS
ORAL_TABLET | ORAL | Status: AC
Start: 2014-06-12 — End: 2014-06-12
  Filled 2014-06-12: qty 2

## 2014-06-12 MED ORDER — MORPHINE SULFATE 0.5 MG/ML IJ SOLN
INTRAMUSCULAR | Status: AC
  Filled 2014-06-12: qty 10

## 2014-06-12 MED ORDER — PRENATAL MULTIVITAMIN CH
1.0000 | ORAL_TABLET | Freq: Every day | ORAL | Status: DC
  Administered 2014-06-13: 1 via ORAL
  Filled 2014-06-12: qty 1

## 2014-06-12 MED ORDER — ONDANSETRON HCL 4 MG/2ML IJ SOLN
INTRAMUSCULAR | Status: DC | PRN
  Administered 2014-06-12: 4 mg via INTRAVENOUS

## 2014-06-12 MED ORDER — BISACODYL 10 MG RE SUPP: 10.0000 mg | Freq: Every day | RECTAL | Status: DC | PRN

## 2014-06-12 MED ORDER — LACTATED RINGERS IV SOLN
INTRAVENOUS | Status: DC | PRN
  Administered 2014-06-12: 10:00:00 via INTRAVENOUS

## 2014-06-12 MED ORDER — METHYLERGONOVINE MALEATE 0.2 MG/ML IJ SOLN: 0.2000 mg | INTRAMUSCULAR | Status: DC | PRN

## 2014-06-12 MED ORDER — SODIUM CHLORIDE 0.9 % IJ SOLN
3.0000 mL | INTRAMUSCULAR | Status: DC | PRN
Start: 2014-06-12 — End: 2014-06-13

## 2014-06-12 MED ORDER — LANOLIN HYDROUS EX OINT: 1.0000 "application " | TOPICAL_OINTMENT | CUTANEOUS | Status: DC | PRN

## 2014-06-12 MED ORDER — BUPIVACAINE HCL (PF) 0.75 % IJ SOLN
INTRAMUSCULAR | Status: DC | PRN
  Administered 2014-06-12: 1.6 mL via INTRATHECAL

## 2014-06-12 MED ORDER — NALBUPHINE HCL 10 MG/ML IJ SOLN: 5.0000 mg | Freq: Once | INTRAMUSCULAR | Status: AC | PRN

## 2014-06-12 MED ORDER — PHENYLEPHRINE 8 MG IN D5W 100 ML (0.08MG/ML) PREMIX OPTIME
INJECTION | INTRAVENOUS | Status: DC | PRN
  Administered 2014-06-12: 60 ug/min via INTRAVENOUS

## 2014-06-12 MED ORDER — LACTATED RINGERS IV SOLN
INTRAVENOUS | Status: DC | PRN
  Administered 2014-06-12 (×2): via INTRAVENOUS

## 2014-06-12 MED ORDER — SIMETHICONE 80 MG PO CHEW
80.0000 mg | CHEWABLE_TABLET | ORAL | Status: DC
  Administered 2014-06-12 – 2014-06-13 (×2): 80 mg via ORAL
  Filled 2014-06-12 (×2): qty 1

## 2014-06-12 MED ORDER — MEPERIDINE HCL 25 MG/ML IJ SOLN: 6.2500 mg | INTRAMUSCULAR | Status: DC | PRN

## 2014-06-12 MED ORDER — FENTANYL CITRATE 0.05 MG/ML IJ SOLN
INTRAMUSCULAR | Status: DC | PRN
  Administered 2014-06-12: 10 ug via INTRATHECAL

## 2014-06-12 MED ORDER — OXYTOCIN 40 UNITS IN LACTATED RINGERS INFUSION - SIMPLE MED: 62.5000 mL/h | INTRAVENOUS | Status: DC

## 2014-06-12 MED ORDER — DIPHENHYDRAMINE HCL 50 MG/ML IJ SOLN: 12.5000 mg | INTRAMUSCULAR | Status: DC | PRN

## 2014-06-12 MED ORDER — FENTANYL CITRATE 0.05 MG/ML IJ SOLN: 25.0000 ug | INTRAMUSCULAR | Status: DC | PRN

## 2014-06-12 MED ORDER — ONDANSETRON HCL 4 MG/2ML IJ SOLN: 4.0000 mg | Freq: Three times a day (TID) | INTRAMUSCULAR | Status: DC | PRN

## 2014-06-12 MED ORDER — ALBUTEROL SULFATE HFA 108 (90 BASE) MCG/ACT IN AERS: 2.0000 | INHALATION_SPRAY | RESPIRATORY_TRACT | Status: DC | PRN

## 2014-06-12 MED ORDER — SODIUM CHLORIDE 0.9 % IJ SOLN: 3.0000 mL | Freq: Two times a day (BID) | INTRAMUSCULAR | Status: DC

## 2014-06-12 MED ORDER — ALBUTEROL SULFATE (2.5 MG/3ML) 0.083% IN NEBU: 2.5000 mg | INHALATION_SOLUTION | RESPIRATORY_TRACT | Status: DC | PRN

## 2014-06-12 MED ORDER — LACTATED RINGERS IV SOLN
Freq: Once | INTRAVENOUS | Status: AC
  Administered 2014-06-12: 1000 mL/h via INTRAVENOUS

## 2014-06-12 MED ORDER — METHYLERGONOVINE MALEATE 0.2 MG PO TABS
0.2000 mg | ORAL_TABLET | ORAL | Status: DC | PRN
Start: 2014-06-12 — End: 2014-06-13

## 2014-06-12 MED ORDER — BUPIVACAINE HCL (PF) 0.25 % IJ SOLN
INTRAMUSCULAR | Status: AC
  Filled 2014-06-12: qty 30

## 2014-06-12 MED ORDER — DIPHENHYDRAMINE HCL 25 MG PO CAPS
25.0000 mg | ORAL_CAPSULE | Freq: Four times a day (QID) | ORAL | Status: DC | PRN
  Administered 2014-06-12: 25 mg via ORAL

## 2014-06-12 MED ORDER — ONDANSETRON HCL 4 MG/2ML IJ SOLN: 4.0000 mg | Freq: Once | INTRAMUSCULAR | Status: DC | PRN

## 2014-06-12 MED ORDER — FERROUS SULFATE 325 (65 FE) MG PO TABS: 325.0000 mg | ORAL_TABLET | Freq: Two times a day (BID) | ORAL | Status: DC

## 2014-06-12 MED ORDER — DIBUCAINE 1 % RE OINT: 1.0000 "application " | TOPICAL_OINTMENT | RECTAL | Status: DC | PRN

## 2014-06-12 MED ORDER — PHENYLEPHRINE 8 MG IN D5W 100 ML (0.08MG/ML) PREMIX OPTIME
INJECTION | INTRAVENOUS | Status: AC
  Filled 2014-06-12: qty 100

## 2014-06-12 MED ORDER — SODIUM CHLORIDE 0.9 % IV SOLN: 250.0000 mL | INTRAVENOUS | Status: DC

## 2014-06-12 MED ORDER — WITCH HAZEL-GLYCERIN EX PADS: 1.0000 "application " | MEDICATED_PAD | CUTANEOUS | Status: DC | PRN

## 2014-06-12 MED ORDER — SCOPOLAMINE 1 MG/3DAYS TD PT72
MEDICATED_PATCH | TRANSDERMAL | Status: AC
  Administered 2014-06-12: 1.5 mg via TRANSDERMAL
  Filled 2014-06-12: qty 1

## 2014-06-12 MED ORDER — SODIUM CHLORIDE 0.9 % IJ SOLN: 3.0000 mL | INTRAMUSCULAR | Status: DC | PRN

## 2014-06-12 MED ORDER — OXYTOCIN 10 UNIT/ML IJ SOLN
40.0000 [IU] | INTRAVENOUS | Status: DC | PRN
  Administered 2014-06-12: 40 [IU] via INTRAVENOUS

## 2014-06-12 MED ORDER — MORPHINE SULFATE (PF) 0.5 MG/ML IJ SOLN
INTRAMUSCULAR | Status: DC | PRN
  Administered 2014-06-12: .2 mg via EPIDURAL

## 2014-06-12 MED ORDER — FLEET ENEMA 7-19 GM/118ML RE ENEM: 1.0000 | ENEMA | Freq: Every day | RECTAL | Status: DC | PRN

## 2014-06-12 MED ORDER — OXYTOCIN 10 UNIT/ML IJ SOLN
INTRAMUSCULAR | Status: AC
  Filled 2014-06-12: qty 4

## 2014-06-12 MED ORDER — KETOROLAC TROMETHAMINE 30 MG/ML IJ SOLN
30.0000 mg | Freq: Four times a day (QID) | INTRAMUSCULAR | Status: DC | PRN
  Administered 2014-06-12 – 2014-06-13 (×3): 30 mg via INTRAVENOUS
  Filled 2014-06-12 (×2): qty 1

## 2014-06-12 MED ORDER — ACETAMINOPHEN 500 MG PO TABS
1000.0000 mg | ORAL_TABLET | Freq: Four times a day (QID) | ORAL | Status: AC
  Administered 2014-06-12 – 2014-06-13 (×4): 1000 mg via ORAL
  Filled 2014-06-12 (×3): qty 2

## 2014-06-12 MED ORDER — PHENYLEPHRINE 40 MCG/ML (10ML) SYRINGE FOR IV PUSH (FOR BLOOD PRESSURE SUPPORT)
PREFILLED_SYRINGE | INTRAVENOUS | Status: AC
  Filled 2014-06-12: qty 10

## 2014-06-12 MED ORDER — ACETAMINOPHEN 325 MG PO TABS: 650.0000 mg | ORAL_TABLET | ORAL | Status: DC | PRN

## 2014-06-12 MED ORDER — FENTANYL CITRATE 0.05 MG/ML IJ SOLN
INTRAMUSCULAR | Status: AC
  Filled 2014-06-12: qty 2

## 2014-06-12 MED ORDER — ZOLPIDEM TARTRATE 5 MG PO TABS: 5.0000 mg | ORAL_TABLET | Freq: Every evening | ORAL | Status: DC | PRN

## 2014-06-12 MED ORDER — SIMETHICONE 80 MG PO CHEW
80.0000 mg | CHEWABLE_TABLET | Freq: Three times a day (TID) | ORAL | Status: DC
  Administered 2014-06-12 – 2014-06-14 (×4): 80 mg via ORAL
  Filled 2014-06-12 (×5): qty 1

## 2014-06-12 MED ORDER — SCOPOLAMINE 1 MG/3DAYS TD PT72
1.0000 | MEDICATED_PATCH | Freq: Once | TRANSDERMAL | Status: DC
  Filled 2014-06-12: qty 1

## 2014-06-12 MED ORDER — BUPIVACAINE HCL (PF) 0.25 % IJ SOLN
INTRAMUSCULAR | Status: DC | PRN
  Administered 2014-06-12: 6 mL

## 2014-06-12 MED ORDER — DIPHENHYDRAMINE HCL 25 MG PO CAPS
25.0000 mg | ORAL_CAPSULE | ORAL | Status: DC | PRN
  Filled 2014-06-12: qty 1

## 2014-06-12 MED ORDER — OXYCODONE-ACETAMINOPHEN 5-325 MG PO TABS
1.0000 | ORAL_TABLET | ORAL | Status: DC | PRN
  Administered 2014-06-13 (×2): 1 via ORAL
  Filled 2014-06-12 (×4): qty 1

## 2014-06-12 SURGICAL SUPPLY — 47 items
APL SKNCLS STERI-STRIP NONHPOA (GAUZE/BANDAGES/DRESSINGS)
BARRIER ADHS 3X4 INTERCEED (GAUZE/BANDAGES/DRESSINGS) ×3 IMPLANT
BENZOIN TINCTURE PRP APPL 2/3 (GAUZE/BANDAGES/DRESSINGS) IMPLANT
BRR ADH 4X3 ABS CNTRL BYND (GAUZE/BANDAGES/DRESSINGS) ×1
CLAMP CORD UMBIL (MISCELLANEOUS) IMPLANT
CLEAR SCREEN DRAPE ×2 IMPLANT
CLOSURE WOUND 1/2 X4 (GAUZE/BANDAGES/DRESSINGS)
CLOTH BEACON ORANGE TIMEOUT ST (SAFETY) ×3 IMPLANT
CONTAINER PREFILL 10% NBF 15ML (MISCELLANEOUS) IMPLANT
DRAPE SHEET LG 3/4 BI-LAMINATE (DRAPES) IMPLANT
DRSG OPSITE POSTOP 4X10 (GAUZE/BANDAGES/DRESSINGS) ×3 IMPLANT
DURAPREP 26ML APPLICATOR (WOUND CARE) ×3 IMPLANT
ELECT REM PT RETURN 9FT ADLT (ELECTROSURGICAL) ×3
ELECTRODE REM PT RTRN 9FT ADLT (ELECTROSURGICAL) ×1 IMPLANT
EXTRACTOR VACUUM M CUP 4 TUBE (SUCTIONS) IMPLANT
EXTRACTOR VACUUM M CUP 4' TUBE (SUCTIONS)
GLOVE BIOGEL PI IND STRL 7.0 (GLOVE) ×1 IMPLANT
GLOVE BIOGEL PI INDICATOR 7.0 (GLOVE) ×2
GLOVE ECLIPSE 6.5 STRL STRAW (GLOVE) ×3 IMPLANT
GOWN STRL REUS W/TWL LRG LVL3 (GOWN DISPOSABLE) ×6 IMPLANT
KIT ABG SYR 3ML LUER SLIP (SYRINGE) IMPLANT
NDL HYPO 25X1 1.5 SAFETY (NEEDLE) ×1 IMPLANT
NDL HYPO 25X5/8 SAFETYGLIDE (NEEDLE) IMPLANT
NEEDLE HYPO 25X1 1.5 SAFETY (NEEDLE) ×3 IMPLANT
NEEDLE HYPO 25X5/8 SAFETYGLIDE (NEEDLE) IMPLANT
NS IRRIG 1000ML POUR BTL (IV SOLUTION) ×3 IMPLANT
PACK C SECTION WH (CUSTOM PROCEDURE TRAY) ×3 IMPLANT
PAD OB MATERNITY 4.3X12.25 (PERSONAL CARE ITEMS) ×3 IMPLANT
RETRACTOR WND ALEXIS 25 LRG (MISCELLANEOUS) IMPLANT
RTRCTR C-SECT PINK 25CM LRG (MISCELLANEOUS) IMPLANT
RTRCTR WOUND ALEXIS 25CM LRG (MISCELLANEOUS) ×3
STAPLER VISISTAT 35W (STAPLE) IMPLANT
STRIP CLOSURE SKIN 1/2X4 (GAUZE/BANDAGES/DRESSINGS) IMPLANT
SUT CHROMIC GUT AB #0 18 (SUTURE) IMPLANT
SUT MNCRL 0 VIOLET CTX 36 (SUTURE) ×3 IMPLANT
SUT MON AB 4-0 PS1 27 (SUTURE) IMPLANT
SUT MONOCRYL 0 CTX 36 (SUTURE) ×6
SUT PLAIN 2 0 (SUTURE)
SUT PLAIN 2 0 XLH (SUTURE) IMPLANT
SUT PLAIN ABS 2-0 CT1 27XMFL (SUTURE) IMPLANT
SUT VIC AB 0 CTB1 36 (SUTURE) ×6 IMPLANT
SUT VIC AB 2-0 CT1 27 (SUTURE) ×3
SUT VIC AB 2-0 CT1 TAPERPNT 27 (SUTURE) ×1 IMPLANT
SUT VIC AB 4-0 PS2 27 (SUTURE) IMPLANT
SYR CONTROL 10ML LL (SYRINGE) ×3 IMPLANT
TOWEL OR 17X24 6PK STRL BLUE (TOWEL DISPOSABLE) ×3 IMPLANT
TRAY FOLEY CATH 14FR (SET/KITS/TRAYS/PACK) IMPLANT

## 2014-06-12 NOTE — Transfer of Care (Signed)
Immediate Anesthesia Transfer of Care Note  Patient: Kerry Chavez  Procedure(s) Performed: Procedure(s) with comments: Repeat CESAREAN SECTION WITH BILATERAL TUBAL LIGATION (Bilateral) - EDD: 06/19/14 Wants clear C-Section drape for this case  Patient Location: PACU  Anesthesia Type:Spinal  Level of Consciousness: awake, alert  and oriented  Airway & Oxygen Therapy: Patient Spontanous Breathing  Post-op Assessment: Report given to RN and Post -op Vital signs reviewed and stable  Post vital signs: Reviewed and stable  Last Vitals:  Filed Vitals:   06/12/14 1022  BP:   Pulse:   Temp: 36.4 C  Resp:     Complications: No apparent anesthesia complications

## 2014-06-12 NOTE — Anesthesia Postprocedure Evaluation (Signed)
  Anesthesia Post-op Note  Patient: Kerry Chavez  Procedure(s) Performed: Procedure(s) (LRB): Repeat CESAREAN SECTION WITH BILATERAL TUBAL LIGATION (Bilateral)  Patient Location: PACU  Anesthesia Type: Spinal  Level of Consciousness: awake and alert   Airway and Oxygen Therapy: Patient Spontanous Breathing  Post-op Pain: mild  Post-op Assessment: Post-op Vital signs reviewed, Patient's Cardiovascular Status Stable, Respiratory Function Stable, Patent Airway and No signs of Nausea or vomiting  Last Vitals:  Filed Vitals:   06/12/14 1540  BP: 106/60  Pulse: 60  Temp: 37 C  Resp: 18    Post-op Vital Signs: stable   Complications: No apparent anesthesia complications

## 2014-06-12 NOTE — Op Note (Signed)
NAMETAMARIA, DUNLEAVY NO.:  [id number]  MEDICAL RECORD NO.:  [id number]  LOCATION:  9129                          FACILITY:  [facility]  PHYSICIAN:  Maxie Better, M.D.DATE OF BIRTH:  09-Dec-1985  DATE OF PROCEDURE:  06/12/2014 DATE OF DISCHARGE:                              OPERATIVE REPORT   PREOPERATIVE DIAGNOSIS:  Previous cesarean section, term gestation, desires sterilization.  POSTOPERATIVE DIAGNOSIS:  Previous cesarean section, desires sterilization, term gestation.  PROCEDURES:  Repeat cesarean section, Sharl Ma hysterotomy, modified Pomeroy tubal ligation.  ANESTHESIA:  Spinal.  SURGEON:  Maxie Better, M.D.  ASSISTANT:  Marlinda Mike, C.N.M.  DESCRIPTION OF PROCEDURE:  Under adequate spinal anesthesia, the patient was placed in a supine position with a left lateral tilt.  She was sterilely prepped and draped in usual fashion.  An indwelling Foley catheter was sterilely placed.  Marcaine 0.25% was injected along the previous Pfannenstiel skin incision.  Pfannenstiel skin incision was then made through the previous scar and carried down to the rectus fascia.  The rectus fascia was opened transversely.  Rectus fascia then bluntly and sharply dissected off the rectus muscle in superior and inferior fashion.  The parietal peritoneum was then incidentally opened at the same time.  The parietal peritoneum was then extended superiorly and inferiorly with the rectus muscle being split in midline.  Small bladder adhesions were noted, but otherwise unremarkable.  Alexis retractor was then placed.  Vesicouterine peritoneum was opened carefully.  The bladder was then sharply dissected off the lower uterine segment and displaced inferiorly.  A curvilinear low transverse uterine incision was then made and extended with bandage scissors.  Copious artificial rupture of membranes of clear fluid was noted.  The lower uterine segment was thin.  The subsequent live  female was delivered and bulb suctioned in the abdomen.  Cord was clamped and cut.  The baby was transferred to the awaiting pediatricians who assigned Apgars of 9 and 9 at one and five minutes.  The placenta was manually removed.  Uterine cavity was cleaned of debris.  It was not sent to Pathology.  Uterine incision had no extension.  Uterine incision was closed in 2 layers, the first layer with 0 Monocryl running locked stitch, second layer was imbricated using 0 Monocryl suture.  Attention was then turned to both adnexa.  Both fallopian tubes and  ovaries were noted to be normal. The midportion of both fallopian tubes was grasped with a Babcock.  The underlying mesosalpinx was opened with cautery.  The proximal and distal portion of both side of tube were tied with 0 chromic suture proximally and distally.  The intervening segment of tube was then removed.  At that point, the abdomen was irrigated and suctioned of debris.  The Alexis retractor was carefully removed.  The Interceed was placed in an inverted T-fashion overlying the uterine incision.  The parietal peritoneum was then closed with 2-0 Vicryl.  The rectus fascia was closed with 0 Vicryl x2.  The subcutaneous area was irrigated, small bleeders cauterized.  Interrupted 2-0 plain sutures placed, and the skin approximated with Ethicon staples.  SPECIMEN:  Portion of right and left fallopian tubes sent to Pathology. Placenta  was not sent.  ESTIMATED BLOOD LOSS:  750 mL.  URINE OUTPUT:  200 mL clear yellow urine.  INTRAOPERATIVE FLUID:  2 L.  COUNTS:  Sponge and instrument counts x2 were correct.  COMPLICATION:  None.  The patient tolerated the procedure well and was transferred to recovery room in stable condition.  The baby did skin to skin prior to transfer to recovery.     Maxie BetterSheronette Barney Russomanno, M.D.     [place]/MEDQ  D:  06/12/2014  T:  06/12/2014  Job:  [id number]

## 2014-06-12 NOTE — Anesthesia Procedure Notes (Signed)
Spinal Patient location during procedure: OR Start time: 06/12/2014 9:05 AM Staffing Anesthesiologist: Karie SchwalbeJUDD, Cleo Villamizar Performed by: anesthesiologist  Preanesthetic Checklist Completed: patient identified, site marked, surgical consent, pre-op evaluation, timeout performed, IV checked, risks and benefits discussed and monitors and equipment checked Spinal Block Patient position: sitting Prep: ChloraPrep Patient monitoring: continuous pulse ox, blood pressure and heart rate Approach: midline Location: L3-4 Injection technique: single-shot Needle Needle type: Sprotte  Needle gauge: 24 G Needle length: 9 cm Additional Notes Functioning IV was confirmed and monitors were applied. Sterile prep and drape, including hand hygiene, mask and sterile gloves were used. The patient was positioned and the spine was prepped. The skin was anesthetized with lidocaine.  Free flow of clear CSF was obtained prior to injecting local anesthetic into the CSF.  The spinal needle aspirated freely following injection.  The needle was carefully withdrawn.  The patient tolerated the procedure well. Consent was obtained prior to procedure with all questions answered and concerns addressed. Risks including but not limited to bleeding, infection, nerve damage, paralysis, failed block, inadequate analgesia, allergic reaction, high spinal, itching and headache were discussed and the patient wished to proceed.   Karie SchwalbeMary Makenzie Weisner, MD

## 2014-06-12 NOTE — Brief Op Note (Signed)
06/12/2014  10:07 AM  PATIENT:  Kerry Chavez  29 y.o. female  PRE-OPERATIVE DIAGNOSIS:  Previous Cesarean Section, Desires Sterilization, term gestation  POST-OPERATIVE DIAGNOSIS:  Previous Cesarean Section, Desires Sterilization, term gestation  PROCEDURE:  Repeat Cesarean section, Sharl MaKerr hysterotomy Modified Pomeroy tubal ligation  SURGEON:  Surgeon(s) and Role:    * Maxie BetterSheronette Kamela Blansett, MD - Primary  PHYSICIAN ASSISTANT:   ASSISTANTS: Kerry Chavez, CNM   ANESTHESIA:   spinal   Findings: live female Apgar 9/9, nl tubes and ovaries, ant placenta  EBL:  Total I/O In: 2000 [I.V.:2000] Out: 950 [Urine:200; Blood:750]  BLOOD ADMINISTERED:none  DRAINS: none   LOCAL MEDICATIONS USED:  MARCAINE     SPECIMEN:  Source of Specimen:  portion of right and left tube  DISPOSITION OF SPECIMEN:  PATHOLOGY  COUNTS:  YES  TOURNIQUET:  * No tourniquets in log *  DICTATION: .Other Dictation: Dictation Number [id number]  PLAN OF CARE: Admit to inpatient   PATIENT DISPOSITION:  PACU - hemodynamically stable.   Delay start of Pharmacological VTE agent (>24hrs) due to surgical blood loss or risk of bleeding: no

## 2014-06-13 ENCOUNTER — Encounter (HOSPITAL_COMMUNITY): Payer: Self-pay | Admitting: *Deleted

## 2014-06-13 LAB — CBC
HCT: 29.7 % — ABNORMAL LOW (ref 36.0–46.0)
Hemoglobin: 10.2 g/dL — ABNORMAL LOW (ref 12.0–15.0)
MCH: 33.1 pg (ref 26.0–34.0)
MCHC: 34.3 g/dL (ref 30.0–36.0)
MCV: 96.4 fL (ref 78.0–100.0)
Platelets: 212 10*3/uL (ref 150–400)
RBC: 3.08 MIL/uL — ABNORMAL LOW (ref 3.87–5.11)
RDW: 14.6 % (ref 11.5–15.5)
WBC: 15.4 10*3/uL — AB (ref 4.0–10.5)

## 2014-06-13 MED ORDER — FERROUS SULFATE 325 (65 FE) MG PO TABS
325.0000 mg | ORAL_TABLET | Freq: Every day | ORAL | Status: DC
  Administered 2014-06-14: 325 mg via ORAL
  Filled 2014-06-13 (×3): qty 1

## 2014-06-13 MED ORDER — BUPROPION HCL ER (XL) 150 MG PO TB24
150.0000 mg | ORAL_TABLET | Freq: Every day | ORAL | Status: DC
  Administered 2014-06-13 – 2014-06-14 (×2): 150 mg via ORAL
  Filled 2014-06-13 (×2): qty 1

## 2014-06-13 MED ORDER — MAGNESIUM OXIDE 400 (241.3 MG) MG PO TABS
200.0000 mg | ORAL_TABLET | Freq: Every day | ORAL | Status: DC
  Administered 2014-06-13 – 2014-06-14 (×2): 200 mg via ORAL
  Filled 2014-06-13 (×2): qty 0.5

## 2014-06-13 NOTE — Anesthesia Postprocedure Evaluation (Signed)
Anesthesia Post Note  Patient: Kerry Chavez  Procedure(s) Performed: Procedure(s) (LRB): Repeat CESAREAN SECTION WITH BILATERAL TUBAL LIGATION (Bilateral)  Anesthesia type: Spinal  Patient location: Mother/Baby  Post pain: Pain level controlled  Post assessment: Post-op Vital signs reviewed  Last Vitals:  Filed Vitals:   06/13/14 0602  BP: 101/61  Pulse: 61  Temp: 36.8 C  Resp: 20    Post vital signs: Reviewed  Level of consciousness: awake  Complications: No apparent anesthesia complications

## 2014-06-13 NOTE — Addendum Note (Signed)
Addendum  created 06/13/14 [id number] by Algis GreenhouseLinda A Mineola Duan, CRNA   Modules edited: Notes Section   Notes Section:  File: [id number]

## 2014-06-13 NOTE — Progress Notes (Signed)
Clinical Social Work Department PSYCHOSOCIAL ASSESSMENT - MATERNAL/CHILD 06/13/2014  Patient:  Kerry Chavez,Kerry Chavez  Account Number:  [id number]  Admit Date:  06/12/2014  Childs Name:   Kerry Chavez    Clinical Social Worker:  Genine Beckett, LCSW   Date/Time:  06/13/2014 11:00 AM  Date Referred:  06/12/2014   Referral source  Central Nursery     Referred reason  Depression/Anxiety   Other referral source:    I:  FAMILY / HOME ENVIRONMENT Child's legal guardian:  PARENT  Guardian - Name Guardian - Age Guardian - Address  Kerry Chavez,Kerry Chavez 29 [address] [place], [place] [address]  Drakes, Kerry  same as above   Other household support members/support persons Other support:   Extensive family support    II  PSYCHOSOCIAL DATA Information Source:    Financial and Community Resources Employment:   Both parents are employed   Financial resources:  Private Insurance If Medicaid - County:    School / Grade:   Maternity Care Coordinator / Child Services Coordination / Early Interventions:  Cultural issues impacting care:    III  STRENGTHS Strengths  Supportive family/friends  Home prepared for Child (including basic supplies)  Adequate Resources   Strength comment:    IV  RISK FACTORS AND CURRENT PROBLEMS Current Problem:       V  SOCIAL WORK ASSESSMENT Acknowledged order for social work consult regarding mother's hx of anxiety with grief after the sudden death of her father.   Met with mother, and her 10 year old daughter along with maternal grandmother.   Parents are married with one dependent.  Mother states that her father died Oct. 2015 and they were very close.  Informed that she has strong spiritual beliefs and that along with the support of her family has been comforting to her.  She reported no hx of anxiety or depression prior to the death of her father. Mother reports no current symptoms of depression or anxiety.  Informed that she has moments when she would cry and  feel sad about her father's passing, but it nothing that she can't handle with her support system.  She spoke of how difficult it was not having her father with her for the birth of her son.  Validated her feelings and provided supportive feedback.     Maternal grandmother and her daughter were very attentive to her and newborn.     She denies any hx of illicit drug use.   No acute social concerns noted or reported at this time.  Mother informed of social work availability.      VI SOCIAL WORK PLAN Social Work Plan  No Further Intervention Required / No Barriers to Discharge     

## 2014-06-13 NOTE — Progress Notes (Addendum)
POSTOPERATIVE DAY # 1 S/P repeat CS and BTL  S:         Reports feeling well - no pain             Tolerating po intake / no nausea / no vomiting / no flatus / no BM             Bleeding is light             Pain controlled with motrin             Up ad lib / ambulatory/ voiding QS  Newborn bottle-feeding  / Circumcision planned as outpatient (newborn on Medicaid)             Desires assistance with smoking cessation at this time - quit date April 1  O:  VS: BP 101/61 mmHg  Pulse 61  Temp(Src) 98.2 F (36.8 C) (Oral)  Resp 20  Ht  (1.549 m)  Wt 86.637 kg (191 lb)  BMI 36.11 kg/m2  SpO2 99%  LMP 09/12/2013  Breastfeeding? Unknown   LABS:               Recent Labs  06/11/14 0935 06/13/14 0554  WBC 14.4* 15.4*  HGB 12.0 10.2*  PLT 224 212               Bloodtype: --/--/O POS (03/18 0935)  Rubella:    Immune                                       I&O: net negative 450             Physical Exam:             Alert and Oriented X3  Lungs: Clear and unlabored  Heart: regular rate and rhythm / no mumurs  Abdomen: soft, non-tender, non-distended, hypoactive BS             Fundus: firm, non-tender, Ueven             Dressing intact with some drainage in dressing ~ 1/4 of dressing left side              Incision:  no erythema / no ecchymosis  Perineum: intact  Lochia: light  Extremities: 1+ pedal edema, no calf pain or tenderness, SCD in place  A:        POD # 1 S/P repeat CS with BTL            Mild ABL anemia            Tobacco addiction  P:        Routine postoperative care - change dressing today   Start Wellbutrin /smoking cessation plan reviewed -decrease daily use each day to reach      quit day - chewing gum & hand activity to replace smoking habits - no smoking in house or  in car or around newborn. Recheck in office in 2 weeks to monitor status - encourage success. 1-800 Quit # at discharge for cessation support.   Referral for outpatient circumcision -  recommendations from her pediatrician  Consider early DC tomorrow  Staple removal in office with me end of this week   Reviewed breast engorgement comfort measures - wearing bra x 1-2 weeks - ice packs.  Prenatal vitamin daily x 6 weeks - recheck level at postpartum exam /  will give iron samples at staple removal visit - no RX at discharge due to cost to patient.   Marlinda MikeBAILEY, Janiel Crisostomo CNM, MSN, [facility] 06/13/2014, 7:52 AM

## 2014-06-14 LAB — BIRTH TISSUE RECOVERY COLLECTION (PLACENTA DONATION)

## 2014-06-14 MED ORDER — FERROUS SULFATE 325 (65 FE) MG PO TABS: 325.0000 mg | ORAL_TABLET | Freq: Every day | ORAL | Status: DC

## 2014-06-14 MED ORDER — MAGNESIUM OXIDE 400 (241.3 MG) MG PO TABS: 200.0000 mg | ORAL_TABLET | Freq: Every day | ORAL | Status: DC

## 2014-06-14 MED ORDER — BUPROPION HCL ER (XL) 150 MG PO TB24: 150.0000 mg | ORAL_TABLET | Freq: Every day | ORAL | Status: DC

## 2014-06-14 MED ORDER — IBUPROFEN 600 MG PO TABS: 600.0000 mg | ORAL_TABLET | Freq: Four times a day (QID) | ORAL | Status: DC

## 2014-06-14 MED ORDER — OXYCODONE-ACETAMINOPHEN 5-325 MG PO TABS: 1.0000 | ORAL_TABLET | ORAL | Status: DC | PRN

## 2014-06-14 NOTE — Progress Notes (Signed)
POD # 2  Subjective: Pt reports feeling well, desires early discharge/ Pain controlled with Motrin and Percocet Tolerating po/Voiding without problems/ No n/v/ Flatus present Activity: ad lib Bleeding is light Newborn info:  Information for the patient's newborn:  Laverle HobbyBrown, Boy Charlesa [[id number]][[id number]]  female Circumcision: planning outpt/ Feeding: bottle   Objective: VS: VS:  Filed Vitals:   06/13/14 0208 06/13/14 0602 06/13/14 1800 06/14/14 0655  BP: 95/46 101/61 112/60 102/56  Pulse: 73 61 92 82  Temp: 98.6 F (37 C) 98.2 F (36.8 C) 98.2 F (36.8 C) 97.9 F (36.6 C)  TempSrc: Oral Oral Oral   Resp: 20 20 20 20   Height:  5\' 1"  (1.549 m)    Weight:  86.637 kg (191 lb)    SpO2: 97% 99%      I&O: Intake/Output      03/20 0701 - 03/21 0700 03/21 0701 - 03/22 0700   I.V. (mL/kg)     Total Intake(mL/kg)     Urine (mL/kg/hr) 550 (0.3)    Blood     Total Output 550     Net -550            LABS:  Recent Labs  06/13/14 0554  WBC 15.4*  HGB 10.2*  PLT 212                           Physical Exam:  General: alert and cooperative CV: Regular rate and rhythm Resp: CTA bilaterally Abdomen: soft, nontender, normal bowel sounds Incision: healing well, no erythema, no hernia, no seroma, no swelling, well approximated with staples, honeycomb with drk Greener drainage near inferior border of honeycomb. Uterine Fundus: firm, below umbilicus, nontender Lochia: minimal Ext: extremities normal, atraumatic, no cyanosis or edema and Homans sign is negative, no sign of DVT    Assessment: POD #2/ G4P1002/ S/P C/Section & BTL d/t repeat  ABL anemia Tobacco addiction Doing well and stable for discharge home  Plan: Discharge home RX's: Ibuprofen 600mg  po Q 6 hrs prn pain #30 Refill x 1 Percocet 5/325 1 - 2 tabs po every 4 hrs prn pain #30 Refill x 0 Wellbutrin 150 mg po daily #30, no refill Niferex 150mg  po QD #30 Refill x 1 Mag Oxide 200 mg po daily #30, no refill [facility]  Ob/Gyn booklet given Follow-up in 2 wks with Fredric MareBailey, CNM for Wellbutrin eval Follow-up in 4 days for staple removal Follow-up in 6 wks for pp check  Signed: Donette LarryBHAMBRI, Baylie Drakes, Dorris CarnesN, MSN, CNM 06/14/2014, 9:53 AM

## 2014-06-14 NOTE — Discharge Summary (Signed)
POSTOPERATIVE DISCHARGE SUMMARY:  Patient ID: Kerry Chavez MRN: [id number] DOB/AGE: [birth year] 31 y.o.  Admit date: 06/12/2014 Admission Diagnoses: [redacted] weeks gestation, previous CS   Discharge date: 06/14/2014 Discharge Diagnoses: S/P C/S and BTL on 06/12/14, ABL anemia    Prenatal history: G4P1002  EDC: 06/19/14 by Margo Aye Prenatal care at [facility] since [redacted] wks gestation. Primary provider: Marlinda Mike, CNM Prenatal course complicated by previous CS, low weight gain, tobacco use, and GBS carrier.   Prenatal labs: ABO, Rh: --/--/O POS (03/18 0935)  Antibody: NEG (03/18 0935) Rubella:   Immune RPR: Non Reactive (03/18 0935)  HBsAg:   Neg HIV:   Neg GBS:   Pos GTT: 91  Medical / Surgical History :  Past medical history:  Past Medical History  Diagnosis Date  . Asthma   . Depression     Past surgical history:  Past Surgical History  Procedure Laterality Date  . Tonsillectomy and adenoidectomy  1998  . Cesarean section       Medications on Admission: No prescriptions prior to admission    Allergies: Shellfish allergy; Ivp dye; Penicillins; Sulfamethoxazole; and Latex   Intrapartum Course:  Admitted for scheduled RCS under regional anesthesia.   Postpartum Course: Complicated by ABL anemia.  Physical Exam:   VSS: Blood pressure 102/56, pulse 82, temperature 97.9 F (36.6 C), temperature source Oral, resp. rate 20, height  (1.549 m), weight 86.637 kg (191 lb), last menstrual period 09/12/2013, SpO2 99 %, unknown if currently breastfeeding.   Recent Labs (last 2 labs)     LABS:  Recent Labs  06/13/14 0554  WBC 15.4*  HGB 10.2*  PLT 212      General: Alert and oriented x3 Heart: RRR Lungs: CTA bilaterally GI: soft, non-tender, non-distended, BS x4 Lochia: small Uterus: firm below umbilicus Incision: well approximated; honeycomb dressing-no  significant erythema, drainage, or edema Extremities: No edema, Homans neg   Newborn Data Live born female  Birth Weight: 6 lb 13.2 oz (3095 g) APGAR: 9, 9  See operative report for further details  Home with mother.  Discharge Instructions:  Wound Care: keep clean and dry / remove honeycomb POD 6 Postpartum Instructions: [facility] discharge booklet - instructions reviewed Medications:    Medication List    STOP taking these medications       acetaminophen 325 MG tablet  Commonly known as: TYLENOL     calcium carbonate 500 MG chewable tablet  Commonly known as: TUMS - dosed in mg elemental calcium     ranitidine 150 MG tablet  Commonly known as: ZANTAC      TAKE these medications       albuterol 108 (90 BASE) MCG/ACT inhaler  Commonly known as: PROVENTIL HFA;VENTOLIN HFA  Inhale 2 puffs into the lungs every 4 (four) hours as needed for wheezing or shortness of breath. Pt states uses seasonally during winter.     buPROPion 150 MG 24 hr tablet  Commonly known as: WELLBUTRIN XL  Take 1 tablet (150 mg total) by mouth daily.     EPINEPHrine 0.3 mg/0.3 mL Devi  Commonly known as: EPIPEN  Inject 0.3 mLs (0.3 mg total) into the muscle once.     ferrous sulfate 325 (65 FE) MG tablet  Take 1 tablet (325 mg total) by mouth daily with breakfast.     ibuprofen 600 MG tablet  Commonly known as: ADVIL,MOTRIN  Take 1 tablet (600 mg total) by mouth every 6 (six) hours.  magnesium oxide 400 (241.3 MG) MG tablet  Commonly known as: MAG-OX  Take 0.5 tablets (200 mg total) by mouth daily.     oxyCODONE-acetaminophen 5-325 MG per tablet  Commonly known as: PERCOCET/ROXICET  Take 1-2 tablets by mouth every 4 (four) hours as needed (for pain scale 4-7).     prenatal multivitamin Tabs tablet  Take 1 tablet by mouth daily at 12 noon.            Follow-up Information    Follow up with  COUSINS,SHERONETTE A, MD. Schedule an appointment as soon as possible for a visit in 4 days.   Specialty: Obstetrics and Gynecology   Why: staple removal   Contact information:   [address] [place] [address] [phone]       Follow up with Marlinda MikeBAILEY, TANYA, CNM. Schedule an appointment as soon as possible for a visit in 2 weeks.   Specialty: Obstetrics and Gynecology   Why: wellbutrin check   Contact information:   [address] [place] [address] [phone]       Follow up with COUSINS,SHERONETTE A, MD. Schedule an appointment as soon as possible for a visit in 6 weeks.   Specialty: Obstetrics and Gynecology   Contact information:   [address] [place] [address] [phone]         Signed: Donette LarryBHAMBRI, Rayner Erman, Dorris CarnesN MSN, CNM 06/14/2014, 2:00 PM

## 2014-06-14 NOTE — Progress Notes (Deleted)
POSTOPERATIVE DISCHARGE SUMMARY:  Patient ID: Kerry Chavez MRN: [id number] DOB/AGE: 1986/03/14 29 y.o.  Admit date: 06/12/2014 Admission Diagnoses: [redacted] weeks gestation, previous CS   Discharge date: 06/14/2014 Discharge Diagnoses: S/P C/S and BTL on 06/12/14, ABL anemia        Prenatal history: G4P1002   EDC: 06/19/14 by Margo Aye Prenatal care at [facility] since [redacted] wks gestation. Primary provider: Marlinda Mike, CNM Prenatal course complicated by previous CS, low weight gain, tobacco use, and GBS carrier.   Prenatal labs: ABO, Rh: --/--/O POS (03/18 0935)  Antibody: NEG (03/18 0935) Rubella:   Immune RPR: Non Reactive (03/18 0935)  HBsAg:   Neg HIV:   Neg GBS:   Pos GTT: 91  Medical / Surgical History :  Past medical history:  Past Medical History  Diagnosis Date  . Asthma   . Depression     Past surgical history:  Past Surgical History  Procedure Laterality Date  . Tonsillectomy and adenoidectomy  1998  . Cesarean section       Medications on Admission: No prescriptions prior to admission    Allergies: Shellfish allergy; Ivp dye; Penicillins; Sulfamethoxazole; and Latex   Intrapartum Course:  Admitted for scheduled RCS under regional anesthesia.   Postpartum Course: Complicated by ABL anemia.  Physical Exam:   VSS: Blood pressure 102/56, pulse 82, temperature 97.9 F (36.6 C), temperature source Oral, resp. rate 20, height  (1.549 m), weight 86.637 kg (191 lb), last menstrual period 09/12/2013, SpO2 99 %, unknown if currently breastfeeding.  LABS:  Recent Labs  06/13/14 0554  WBC 15.4*  HGB 10.2*  PLT 212    General: Alert and oriented x3 Heart: RRR Lungs: CTA bilaterally GI: soft, non-tender, non-distended, BS x4 Lochia: small Uterus: firm below umbilicus Incision: well approximated; honeycomb dressing-no significant erythema, drainage, or edema Extremities: No edema, Homans neg   Newborn Data Live born female  Birth  Weight: 6 lb 13.2 oz (3095 g) APGAR: 9, 9  See operative report for further details  Home with mother.  Discharge Instructions:  Wound Care: keep clean and dry / remove honeycomb POD 6 Postpartum Instructions: [facility] discharge booklet - instructions reviewed Medications:    Medication List    STOP taking these medications        acetaminophen 325 MG tablet  Commonly known as:  TYLENOL     calcium carbonate 500 MG chewable tablet  Commonly known as:  TUMS - dosed in mg elemental calcium     ranitidine 150 MG tablet  Commonly known as:  ZANTAC      TAKE these medications        albuterol 108 (90 BASE) MCG/ACT inhaler  Commonly known as:  PROVENTIL HFA;VENTOLIN HFA  Inhale 2 puffs into the lungs every 4 (four) hours as needed for wheezing or shortness of breath. Pt states uses seasonally during winter.     buPROPion 150 MG 24 hr tablet  Commonly known as:  WELLBUTRIN XL  Take 1 tablet (150 mg total) by mouth daily.     EPINEPHrine 0.3 mg/0.3 mL Devi  Commonly known as:  EPIPEN  Inject 0.3 mLs (0.3 mg total) into the muscle once.     ferrous sulfate 325 (65 FE) MG tablet  Take 1 tablet (325 mg total) by mouth daily with breakfast.     ibuprofen 600 MG tablet  Commonly known as:  ADVIL,MOTRIN  Take 1 tablet (600 mg total) by mouth every 6 (six) hours.  magnesium oxide 400 (241.3 MG) MG tablet  Commonly known as:  MAG-OX  Take 0.5 tablets (200 mg total) by mouth daily.     oxyCODONE-acetaminophen 5-325 MG per tablet  Commonly known as:  PERCOCET/ROXICET  Take 1-2 tablets by mouth every 4 (four) hours as needed (for pain scale 4-7).     prenatal multivitamin Tabs tablet  Take 1 tablet by mouth daily at 12 noon.            Follow-up Information    Follow up with COUSINS,SHERONETTE A, MD. Schedule an appointment as soon as possible for a visit in 4 days.   Specialty:  Obstetrics and Gynecology   Why:  staple removal   Contact information:   [address] [place] [address] [phone]       Follow up with Marlinda MikeBAILEY, TANYA, CNM. Schedule an appointment as soon as possible for a visit in 2 weeks.   Specialty:  Obstetrics and Gynecology   Why:  wellbutrin check   Contact information:   [address] [place] [address] [phone]       Follow up with COUSINS,SHERONETTE A, MD. Schedule an appointment as soon as possible for a visit in 6 weeks.   Specialty:  Obstetrics and Gynecology   Contact information:   [address] [place] [address] [phone]         Signed: Donette LarryBHAMBRI, Trameka Dorough, Dorris CarnesN MSN, CNM 06/14/2014, 2:00 PM

## 2014-06-15 ENCOUNTER — Encounter (HOSPITAL_COMMUNITY): Payer: Self-pay | Admitting: Obstetrics and Gynecology

## 2014-06-16 ENCOUNTER — Encounter (HOSPITAL_COMMUNITY): Payer: Self-pay | Admitting: *Deleted

## 2014-11-30 ENCOUNTER — Emergency Department (HOSPITAL_COMMUNITY)
Admission: EM | Admit: 2014-11-30 | Discharge: 2014-11-30 | Disposition: A | Discharge status: Home / Self Care | Payer: No Typology Code available for payment source | Attending: Emergency Medicine | Admitting: Emergency Medicine

## 2014-11-30 ENCOUNTER — Encounter (HOSPITAL_COMMUNITY): Payer: Self-pay | Admitting: Emergency Medicine

## 2014-11-30 DIAGNOSIS — Z880 Allergy status to penicillin: Secondary | ICD-10-CM | POA: Insufficient documentation

## 2014-11-30 DIAGNOSIS — R519 Headache, unspecified: Secondary | ICD-10-CM

## 2014-11-30 DIAGNOSIS — R51 Headache: Secondary | ICD-10-CM | POA: Insufficient documentation

## 2014-11-30 DIAGNOSIS — Z91040 Latex allergy status: Secondary | ICD-10-CM | POA: Insufficient documentation

## 2014-11-30 DIAGNOSIS — H538 Other visual disturbances: Secondary | ICD-10-CM | POA: Insufficient documentation

## 2014-11-30 DIAGNOSIS — R197 Diarrhea, unspecified: Secondary | ICD-10-CM | POA: Insufficient documentation

## 2014-11-30 DIAGNOSIS — Z720 Tobacco use: Secondary | ICD-10-CM | POA: Insufficient documentation

## 2014-11-30 DIAGNOSIS — J45901 Unspecified asthma with (acute) exacerbation: Secondary | ICD-10-CM | POA: Insufficient documentation

## 2014-11-30 DIAGNOSIS — R42 Dizziness and giddiness: Secondary | ICD-10-CM | POA: Insufficient documentation

## 2014-11-30 DIAGNOSIS — R002 Palpitations: Secondary | ICD-10-CM | POA: Insufficient documentation

## 2014-11-30 DIAGNOSIS — R531 Weakness: Secondary | ICD-10-CM | POA: Insufficient documentation

## 2014-11-30 DIAGNOSIS — Z79899 Other long term (current) drug therapy: Secondary | ICD-10-CM | POA: Insufficient documentation

## 2014-11-30 DIAGNOSIS — R5383 Other fatigue: Secondary | ICD-10-CM | POA: Insufficient documentation

## 2014-11-30 LAB — BASIC METABOLIC PANEL
Anion gap: 6 (ref 5–15)
BUN: 11 mg/dL (ref 6–20)
CHLORIDE: 111 mmol/L (ref 101–111)
CO2: 22 mmol/L (ref 22–32)
CREATININE: 0.57 mg/dL (ref 0.44–1.00)
Calcium: 8.6 mg/dL — ABNORMAL LOW (ref 8.9–10.3)
GFR calc Af Amer: >60 mL/min (ref >60)
GFR calc non Af Amer: >60 mL/min (ref >60)
GLUCOSE: 82 mg/dL (ref 65–99)
POTASSIUM: 4.1 mmol/L (ref 3.5–5.1)
Sodium: 139 mmol/L (ref 135–145)

## 2014-11-30 LAB — CBC WITH DIFFERENTIAL/PLATELET
Basophils Absolute: 0 10*3/uL (ref 0.0–0.1)
Basophils Relative: 0 % (ref 0–1)
Eosinophils Absolute: 0.3 10*3/uL (ref 0.0–0.7)
Eosinophils Relative: 2 % (ref 0–5)
HEMATOCRIT: 32.1 % — AB (ref 36.0–46.0)
HEMOGLOBIN: 10.7 g/dL — AB (ref 12.0–15.0)
LYMPHS ABS: 4 10*3/uL (ref 0.7–4.0)
LYMPHS PCT: 34 % (ref 12–46)
MCH: 31.4 pg (ref 26.0–34.0)
MCHC: 33.3 g/dL (ref 30.0–36.0)
MCV: 94.1 fL (ref 78.0–100.0)
MONOS PCT: 6 % (ref 3–12)
Monocytes Absolute: 0.7 10*3/uL (ref 0.1–1.0)
NEUTROS PCT: 58 % (ref 43–77)
Neutro Abs: 6.9 10*3/uL (ref 1.7–7.7)
Platelets: 239 10*3/uL (ref 150–400)
RBC: 3.41 MIL/uL — AB (ref 3.87–5.11)
RDW: 16.5 % — ABNORMAL HIGH (ref 11.5–15.5)
WBC: 11.9 10*3/uL — AB (ref 4.0–10.5)

## 2014-11-30 MED ORDER — ACETAMINOPHEN 500 MG PO TABS: 500.0000 mg | ORAL_TABLET | Freq: Four times a day (QID) | ORAL | Status: DC | PRN

## 2014-11-30 MED ORDER — ACETAMINOPHEN 325 MG PO TABS
650.0000 mg | ORAL_TABLET | Freq: Once | ORAL | Status: AC
  Administered 2014-11-30: 650 mg via ORAL
  Filled 2014-11-30: qty 2

## 2014-11-30 MED ORDER — SODIUM CHLORIDE 0.9 % IV BOLUS (SEPSIS)
1000.0000 mL | Freq: Once | INTRAVENOUS | Status: AC
  Administered 2014-11-30: 1000 mL via INTRAVENOUS

## 2014-11-30 NOTE — ED Notes (Signed)
Dizziness from standing to sitting back down

## 2014-11-30 NOTE — Discharge Instructions (Signed)
1. Medications: tylenol, usual home medications 2. Treatment: rest, drink plenty of fluids 3. Follow Up: please followup with your primary doctor for discussion of your diagnoses and further evaluation after today's visit; if you do not have a primary care doctor use the resource guide provided to find one; please return to the ER for new or worsening symptoms, loss of consciousness, chest pain, shortness of breath    Near-Syncope Near-syncope (commonly known as near fainting) is sudden weakness, dizziness, or feeling like you might pass out. This can happen when getting up or while standing for a long time. It is caused by a sudden decrease in blood flow to the brain, which can occur for various reasons. Most of the reasons are not serious.  HOME CARE Watch your condition for any changes.  Have someone stay with you until you feel stable.  If you feel like you are going to pass out:  Lie down right away.  Prop your feet up if you can.  Breathe deeply and steadily.  Move only when the feeling has gone away. Most of the time, this feeling lasts only a few minutes. You may feel tired for several hours.  Drink enough fluids to keep your pee (urine) clear or pale yellow.  If you are taking blood pressure or heart medicine, stand up slowly.  Follow up with your doctor as told. GET HELP RIGHT AWAY IF:   You have a severe headache.  You have unusual pain in the chest, belly (abdomen), or back.  You have bleeding from the mouth or butt (rectum), or you have black or tarry poop (stool).  You feel your heart beat differently than normal, or you have a very fast pulse.  You pass out, or you twitch and shake when you pass out.  You pass out when sitting or lying down.  You feel confused.  You have trouble walking.  You are weak.  You have vision problems. MAKE SURE YOU:   Understand these instructions.  Will watch your condition.  Will get help right away if you are not  doing well or get worse. Document Released: 08/29/2007 Document Revised: 03/17/2013 Document Reviewed: 08/15/2012 [facility] Patient Information 2015 [place], [place]. This information is not intended to replace advice given to you by your health care provider. Make sure you discuss any questions you have with your health care provider.   Headaches, Frequently Asked Questions MIGRAINE HEADACHES Q: What is migraine? What causes it? How can I treat it? A: Generally, migraine headaches begin as a dull ache. Then they develop into a constant, throbbing, and pulsating pain. You may experience pain at the temples. You may experience pain at the front or back of one or both sides of the head. The pain is usually accompanied by a combination of:  Nausea.  Vomiting.  Sensitivity to light and noise. Some people (about 15%) experience an aura (see below) before an attack. The cause of migraine is believed to be chemical reactions in the brain. Treatment for migraine may include over-the-counter or prescription medications. It may also include self-help techniques. These include relaxation training and biofeedback.  Q: What is an aura? A: About 15% of people with migraine get an "aura". This is a sign of neurological symptoms that occur before a migraine headache. You may see wavy or jagged lines, dots, or flashing lights. You might experience tunnel vision or blind spots in one or both eyes. The aura can include visual or auditory hallucinations (something imagined). It  may include disruptions in smell (such as strange odors), taste or touch. Other symptoms include:  Numbness.  A "pins and needles" sensation.  Difficulty in recalling or speaking the correct word. These neurological events may last as long as 60 minutes. These symptoms will fade as the headache begins. Q: What is a trigger? A: Certain physical or environmental factors can lead to or "trigger" a migraine. These  include:  Foods.  Hormonal changes.  Weather.  Stress. It is important to remember that triggers are different for everyone. To help prevent migraine attacks, you need to figure out which triggers affect you. Keep a headache diary. This is a good way to track triggers. The diary will help you talk to your healthcare professional about your condition. Q: Does weather affect migraines? A: Bright sunshine, hot, humid conditions, and drastic changes in barometric pressure may lead to, or "trigger," a migraine attack in some people. But studies have shown that weather does not act as a trigger for everyone with migraines. Q: What is the link between migraine and hormones? A: Hormones start and regulate many of your body's functions. Hormones keep your body in balance within a constantly changing environment. The levels of hormones in your body are unbalanced at times. Examples are during menstruation, pregnancy, or menopause. That can lead to a migraine attack. In fact, about three quarters of all women with migraine report that their attacks are related to the menstrual cycle.  Q: Is there an increased risk of stroke for migraine sufferers? A: The likelihood of a migraine attack causing a stroke is very remote. That is not to say that migraine sufferers cannot have a stroke associated with their migraines. In persons under age 25, the most common associated factor for stroke is migraine headache. But over the course of a person's normal life span, the occurrence of migraine headache may actually be associated with a reduced risk of dying from cerebrovascular disease due to stroke.  Q: What are acute medications for migraine? A: Acute medications are used to treat the pain of the headache after it has started. Examples over-the-counter medications, NSAIDs, ergots, and triptans.  Q: What are the triptans? A: Triptans are the newest class of abortive medications. They are specifically targeted to treat  migraine. Triptans are vasoconstrictors. They moderate some chemical reactions in the brain. The triptans work on receptors in your brain. Triptans help to restore the balance of a neurotransmitter called serotonin. Fluctuations in levels of serotonin are thought to be a main cause of migraine.  Q: Are over-the-counter medications for migraine effective? A: Over-the-counter, or "OTC," medications may be effective in relieving mild to moderate pain and associated symptoms of migraine. But you should see your caregiver before beginning any treatment regimen for migraine.  Q: What are preventive medications for migraine? A: Preventive medications for migraine are sometimes referred to as "prophylactic" treatments. They are used to reduce the frequency, severity, and length of migraine attacks. Examples of preventive medications include antiepileptic medications, antidepressants, beta-blockers, calcium channel blockers, and NSAIDs (nonsteroidal anti-inflammatory drugs). Q: Why are anticonvulsants used to treat migraine? A: During the past few years, there has been an increased interest in antiepileptic drugs for the prevention of migraine. They are sometimes referred to as "anticonvulsants". Both epilepsy and migraine may be caused by similar reactions in the brain.  Q: Why are antidepressants used to treat migraine? A: Antidepressants are typically used to treat people with depression. They may reduce migraine frequency by regulating chemical levels,  such as serotonin, in the brain.  Q: What alternative therapies are used to treat migraine? A: The term "alternative therapies" is often used to describe treatments considered outside the scope of conventional Western medicine. Examples of alternative therapy include acupuncture, acupressure, and yoga. Another common alternative treatment is herbal therapy. Some herbs are believed to relieve headache pain. Always discuss alternative therapies with your caregiver  before proceeding. Some herbal products contain arsenic and other toxins. TENSION HEADACHES Q: What is a tension-type headache? What causes it? How can I treat it? A: Tension-type headaches occur randomly. They are often the result of temporary stress, anxiety, fatigue, or anger. Symptoms include soreness in your temples, a tightening band-like sensation around your head (a "vice-like" ache). Symptoms can also include a pulling feeling, pressure sensations, and contracting head and neck muscles. The headache begins in your forehead, temples, or the back of your head and neck. Treatment for tension-type headache may include over-the-counter or prescription medications. Treatment may also include self-help techniques such as relaxation training and biofeedback. CLUSTER HEADACHES Q: What is a cluster headache? What causes it? How can I treat it? A: Cluster headache gets its name because the attacks come in groups. The pain arrives with little, if any, warning. It is usually on one side of the head. A tearing or bloodshot eye and a runny nose on the same side of the headache may also accompany the pain. Cluster headaches are believed to be caused by chemical reactions in the brain. They have been described as the most severe and intense of any headache type. Treatment for cluster headache includes prescription medication and oxygen. SINUS HEADACHES Q: What is a sinus headache? What causes it? How can I treat it? A: When a cavity in the bones of the face and skull (a sinus) becomes inflamed, the inflammation will cause localized pain. This condition is usually the result of an allergic reaction, a tumor, or an infection. If your headache is caused by a sinus blockage, such as an infection, you will probably have a fever. An x-ray will confirm a sinus blockage. Your caregiver's treatment might include antibiotics for the infection, as well as antihistamines or decongestants.  REBOUND HEADACHES Q: What is a  rebound headache? What causes it? How can I treat it? A: A pattern of taking acute headache medications too often can lead to a condition known as "rebound headache." A pattern of taking too much headache medication includes taking it more than 2 days per week or in excessive amounts. That means more than the label or a caregiver advises. With rebound headaches, your medications not only stop relieving pain, they actually begin to cause headaches. Doctors treat rebound headache by tapering the medication that is being overused. Sometimes your caregiver will gradually substitute a different type of treatment or medication. Stopping may be a challenge. Regularly overusing a medication increases the potential for serious side effects. Consult a caregiver if you regularly use headache medications more than 2 days per week or more than the label advises. ADDITIONAL QUESTIONS AND ANSWERS Q: What is biofeedback? A: Biofeedback is a self-help treatment. Biofeedback uses special equipment to monitor your body's involuntary physical responses. Biofeedback monitors:  Breathing.  Pulse.  Heart rate.  Temperature.  Muscle tension.  Brain activity. Biofeedback helps you refine and perfect your relaxation exercises. You learn to control the physical responses that are related to stress. Once the technique has been mastered, you do not need the equipment any more. Q: Are headaches  hereditary? A: Four out of five (80%) of people that suffer report a family history of migraine. Scientists are not sure if this is genetic or a family predisposition. Despite the uncertainty, a child has a 50% chance of having migraine if one parent suffers. The child has a 75% chance if both parents suffer.  Q: Can children get headaches? A: By the time they reach high school, most young people have experienced some type of headache. Many safe and effective approaches or medications can prevent a headache from occurring or stop it  after it has begun.  Q: What type of doctor should I see to diagnose and treat my headache? A: Start with your primary caregiver. Discuss his or her experience and approach to headaches. Discuss methods of classification, diagnosis, and treatment. Your caregiver may decide to recommend you to a headache specialist, depending upon your symptoms or other physical conditions. Having diabetes, allergies, etc., may require a more comprehensive and inclusive approach to your headache. The National Headache Foundation will provide, upon request, a list of [facility] physician members in your state. Document Released: 06/02/2003 Document Revised: 06/04/2011 Document Reviewed: 11/10/2007 [facility] Patient Information 2015 [place], [place]. This information is not intended to replace advice given to you by your health care provider. Make sure you discuss any questions you have with your health care provider.   Emergency Department Resource Guide 1) Find a Doctor and Pay Out of Pocket Although you won't have to find out who is covered by your insurance plan, it is a good idea to ask around and get recommendations. You will then need to call the office and see if the doctor you have chosen will accept you as a new patient and what types of options they offer for patients who are self-pay. Some doctors offer discounts or will set up payment plans for their patients who do not have insurance, but you will need to ask so you aren't surprised when you get to your appointment.  2) Contact Your Local Health Department Not all health departments have doctors that can see patients for sick visits, but many do, so it is worth a call to see if yours does. If you don't know where your local health department is, you can check in your phone book. The CDC also has a tool to help you locate your state's health department, and many state websites also have listings of all of their local health departments.  3) Find a Walk-in Clinic If  your illness is not likely to be very severe or complicated, you may want to try a walk in clinic. These are popping up all over the country in pharmacies, drugstores, and shopping centers. They're usually staffed by nurse practitioners or physician assistants that have been trained to treat common illnesses and complaints. They're usually fairly quick and inexpensive. However, if you have serious medical issues or chronic medical problems, these are probably not your best option.  No Primary Care Doctor: - Call Health Connect at  [phone] - they can help you locate a primary care doctor that  accepts your insurance, provides certain services, etc. - Physician Referral Service- [phone]  Chronic Pain Problems: Organization         Address  Phone   Notes  [facility]  [phone] Patients need to be referred by their primary care doctor.   Medication Assistance: Organization         Address  Phone   Notes  [facility]  Medication Assistance Program [address] [place], [place] [address] [phone] --Must be a resident of [facility] -- Must have NO insurance coverage whatsoever (no Medicaid/ Medicare, etc.) -- The pt. MUST have a primary care doctor that directs their care regularly and follows them in the community   [facility]ist  [phone]   [employer]  [phone]    Agencies that provide inexpensive medical care: Organization         Address  Phone   Notes  [facility]  [phone]   [facility]    [phone]   [facility] [address] [place], [place] [address] [phone]   [facility] [address] [place] [place] [phone]   Planned Parenthood    [phone]   [facility]    [phone]   [facility]  [address], [place] Phone:  [phone], Fax:  [phone] Hours of  Operation:  9 am - 6 pm, M-F.  Also accepts Medicaid/Medicare and self-pay.  [facility]  [address], [place] Phone: [phone], Fax: [phone]. Hours of Operation:  8:30 am - 5:30 pm, M-F.  Also accepts Medicaid and self-pay.  [facility] [place] [address] [place] Phone: [phone]   [facility] [address] Natasha Bence [place], [place] [phone], Ext. [phone] Mondays & Thursdays: 7-9 AM.  First 15 patients are seen on a first come, first serve basis.    Medicaid-accepting [facility] Providers:  Organization         Address  Phone   Notes  [facility] [address], [place] [phone] Also accepts self-pay patients.  [facility] [address] Laurell Josephs [place], [place]  [phone]   [facility] [address], [place] [phone]   [facility] [address] [place] [phone]   Renaye Rakers [address], [place]   [phone] Only accepts [place] patients after they have their name applied to their card.   Self-Pay (no insurance) in [facility]:  Organization         Address  Phone   Notes  Sickle Cell Patients, [facility] [address] [place], [place] [phone]   [facility] [address] [place], [place] [phone]   [facility] [place]  [address], [place] [phone]   [facility]/Dr. Osei-Bonsu  [address] [place] or [address], [place] [phone] Phone number for both [place] and [place] locations is the same.  [facility] and [facility] [address], [place] [phone]   [facility] [address] [place] or [address] [phone]   [facility] [address] [place] [phone], phone; [phone], fax Sees patients 1st and 3rd Saturday of every month.  Must not qualify for public or private insurance (i.e. Medicaid, Medicare, [place] Health Choice, Veterans' Benefits)  Household income should be no more than 200% of the poverty level The clinic cannot treat you if you are pregnant or think you are pregnant  Sexually transmitted diseases are not treated  at the clinic.    Dental Care: Organization         Address  Phone  Notes  [facility] [address] [place], [place] [phone] Accepts children up to age 73 who are enrolled in [place] or [place] Health Choice; pregnant women with a Medicaid card; and children who have applied for Medicaid or [place] Health Choice, but were declined, whose parents can pay a reduced fee at time of service.  [facility] Department of [facility]  [address], [place] [phone] Accepts children up to age 9 who are enrolled in [place] or [place] Health Choice; pregnant women with a Medicaid card; and children who have applied for Medicaid or  Health Choice, but were declined, whose parents can pay a reduced fee at time of service.  [facility]  [address] [place], [place] [phone] Patients are seen by appointment only. Walk-ins are not accepted. [facility] will see patients 38 years of age and older. Monday - Tuesday (8am-5pm) Most Wednesdays (8:30-5pm) $30 per visit, cash only  [facility] Adult Dental Access [facility]  [address], [facility] [phone] Patients are seen by appointment only. Walk-ins are not accepted. [facility] will see patients 73 years of age and older. One Wednesday Evening (Monthly: Volunteer Based).  $30 per visit, cash only  [employer]  [phone] for adults; Children under age 43, call Graduate Pediatric  Dentistry at [phone]. Children aged 83-14, please call [phone] to request a pediatric application.  Dental services are provided in all areas of dental care including fillings, crowns and bridges, complete and partial dentures, implants, gum treatment, root canals, and extractions. Preventive care is also provided. Treatment is provided to both adults and children. Patients are selected via a lottery and there is often a waiting list.   [facility] [address] [place]  [phone] [email or web address]   [facility] [address] [place], [place] [phone], Ext. [phone] Second and Fourth Thursday of each month, opens at 6:30 AM; Clinic ends at 9 AM.  Patients are seen on a first-come first-served basis, and a limited number are seen during each clinic.   [facility]  [address] [place], [place] [phone]   Eligibility Requirements You must have lived in [place], [place], or [place] counties for at least the last three months.   You cannot be eligible for state or federal sponsored [place], including [employer], [place], or [employer].   You generally cannot be eligible for healthcare insurance through your employer.    How to apply: Eligibility screenings are held every Tuesday and Wednesday afternoon from 1:00 pm until 4:00 pm. You do not need an appointment for the interview!  [facility] [address] [place], [place] [phone]   [facility] Health Department  [phone]   [facility]  [phone]   [facility] Health Department  [phone]    Behavioral Health Resources in the Community: Intensive Outpatient Programs Organization         Address  Phone  Notes  [facility] [address], [place], [place] [phone]   [facility] Outpatient [address] [place], [place] [phone]   ADS:  [facility] [address], [place], [place]  [phone]   [facility]  Mental Health [address],  [place], [place] [phone] or [phone]   Substance Abuse Resources Organization         Address  Phone  Notes  [facility] Drug Services  [phone]   [facility]  [phone]   [facility] [place]  [phone]   Floydene Flock  [phone]   Residential & Outpatient Substance Abuse Program  [phone]   Psychological Services Organization         Address  Phone  Notes  [facility]  [phone]   [facility]  [phone]   [facility] [address] [place] [phone] or [phone]    Mobile Crisis Teams Organization         Address  Phone  Notes  [facility], Mobile Crisis Care Unit  [phone]   [facility]  [address] [place], [place] [phone]   Doristine Locks [address] [place] [phone]    Self-Help/Support Groups Organization         Address  Phone             Notes  [facility] [place] - variety of support groups  [phone]- I7437963 Call for more information  Narcotics Anonymous (NA), [facility] [address], [employer] [place]  2 meetings at this location   Statistician         Address  Phone  Notes  [facility] [address],    [place]  [phone]   [facility]  [address] [place] [id number], [place], [place] [phone]   [facility] [address] [place], [place] [phone] Admissions: 8am-3pm M-F  [facility] [address]    [place], [place] [phone]   [facility] [address] [place], [place], [place] [phone]   [facility] [address]  [place], [place] [phone]   [facility] - Intensive Outpatient [address],  [place], [place] [phone]   [facility] ([facility] [address] [place]  [place], [place] [phone] or [phone]   [facility] [address] [place], [place] [phone] Accepts Medicaid  [facility] [address]  [place] [phone] Substance Abuse/Addiction Treatment   [facility] Organization         Address  Phone  Notes  [facility]  [phone]   Angie Fava, PhD [address] Er[address] Knack [place], [place]   [phone] or [phone]   [facility]   [address] [place], [place] [phone]   [facility] [address] [place], [place] [phone] Insurance/Medicaid/sponsorship through [facility] [place] [facility] [address]                                    [place], [place] [phone] Therapy/tele-psych/case  [facility] [address], [place] [phone]    Dr. Lolly Mustache  [phone]   [facility]  [employer] [facility] 1) [address] [place] 2) [address] [place] 3)  [address], [place] [phone]  [phone]   [facility] Child Abuse Hotline [phone] or [phone] (After Hours)

## 2014-11-30 NOTE — ED Notes (Signed)
Pt reports SOB and dizziness x1 month. Has recently lost her father and is now having to take care of her mother. Also has a new baby. Says, "I just feel like I'm going to pass out at random times and then I get SOB." Does not take medication for anxiety. Denies chest pain/emesis but endorses nausea. RR even/unlabored. Also c/o headache and dizziness when turning her head. Pt is tearful in triage. Denies SI/HI saying, "I have too much to live for."

## 2014-11-30 NOTE — ED Provider Notes (Signed)
CSN: [id number]     Arrival date & time 11/30/14  1922 History   First MD Initiated Contact with Patient 11/30/14 2015     Chief Complaint  Patient presents with  . Dizziness  . Shortness of Breath    HPI   Kerry Chavez is a 29 y.o. female with a PMH of depression, asthma who presents to the ED with episodes of lightheadedness. She states she feels like she is going to pass out, and that this has occurred >6 times daily for the last 3 months. She reports her lightheadedness typically lasts for 1 minute. She is unable to identify anything that precipitates or relieves her symptoms. She reports stress, and that her father passed away recently and she is taking care of her mother and 2 children. She reports headache, dizziness, and blurry vision, which have occurred intermittently over the past 3 months. She denies fever, chills, chest pain. Reports palpitations and shortness of breath when she feels anxious. Denies abdominal pain, N/V/C, reports occasional diarrhea. Denies blood in stool. Denies numbness or paresthesia. Reports generalized weakness and fatigue.    Past Medical History  Diagnosis Date  . Asthma   . Depression    Past Surgical History  Procedure Laterality Date  . Tonsillectomy and adenoidectomy  1998  . Cesarean section    . Cesarean section with bilateral tubal ligation Bilateral 06/12/2014    Procedure: Repeat CESAREAN SECTION WITH BILATERAL TUBAL LIGATION;  Surgeon: Maxie Better, MD;  Location: [facility];  Service: Obstetrics;  Laterality: Bilateral;  EDD: 06/19/14 Wants clear C-Section drape for this case   Family History  Problem Relation Age of Onset  . Diabetes Mother    Social History  Substance Use Topics  . Smoking status: Current Every Day Smoker -- 0.25 packs/day for 10 years    Types: Cigarettes  . Smokeless tobacco: None  . Alcohol Use: Yes     Comment: not while pregnant   OB History    Gravida Para Term Preterm AB TAB SAB Ectopic Multiple Living    4 2 2       0 2      Review of Systems  Constitutional: Positive for fatigue. Negative for fever, chills, activity change and appetite change.  HENT: Negative for congestion.   Eyes: Positive for visual disturbance.       Reports intermittent blurry vision.  Respiratory: Positive for shortness of breath. Negative for cough and wheezing.   Cardiovascular: Positive for palpitations. Negative for chest pain and leg swelling.  Gastrointestinal: Positive for diarrhea. Negative for nausea, vomiting, abdominal pain, constipation, blood in stool and abdominal distention.  Genitourinary: Negative for dysuria, urgency and frequency.  Musculoskeletal: Negative for myalgias, back pain, joint swelling, arthralgias, gait problem, neck pain and neck stiffness.  Skin: Negative for color change, pallor, rash and wound.  Neurological: Positive for dizziness, weakness, light-headedness and headaches. Negative for syncope and numbness.       Reports generalized weakness.  All other systems reviewed and are negative.     Allergies  Shellfish allergy; Ivp dye; Penicillins; Sulfamethoxazole; and Latex  Home Medications   Prior to Admission medications   Medication Sig Start Date End Date Taking? Authorizing Provider  albuterol (PROVENTIL HFA;VENTOLIN HFA) 108 (90 BASE) MCG/ACT inhaler Inhale 2 puffs into the lungs every 4 (four) hours as needed for wheezing or shortness of breath. Pt states uses seasonally during winter.    Historical Provider, MD  buPROPion (WELLBUTRIN XL) 150 MG 24 hr tablet Take  1 tablet (150 mg total) by mouth daily. 06/14/14   Lawernce Pitts, CNM  EPINEPHrine (EPIPEN) 0.3 mg/0.3 mL DEVI Inject 0.3 mLs (0.3 mg total) into the muscle once. 10/15/11   Tommie Sams, DO  ferrous sulfate 325 (65 FE) MG tablet Take 1 tablet (325 mg total) by mouth daily with breakfast. 06/14/14   Lawernce Pitts, CNM  ibuprofen (ADVIL,MOTRIN) 600 MG tablet Take 1 tablet (600 mg total) by mouth every 6  (six) hours. 06/14/14   Lawernce Pitts, CNM  magnesium oxide (MAG-OX) 400 (241.3 MG) MG tablet Take 0.5 tablets (200 mg total) by mouth daily. 06/14/14   Lawernce Pitts, CNM  oxyCODONE-acetaminophen (PERCOCET/ROXICET) 5-325 MG per tablet Take 1-2 tablets by mouth every 4 (four) hours as needed (for pain scale 4-7). 06/14/14   Lawernce Pitts, CNM  Prenatal Vit-Fe Fumarate-FA (PRENATAL MULTIVITAMIN) TABS tablet Take 1 tablet by mouth daily at 12 noon.    Historical Provider, MD    BP 104/84 mmHg  Pulse 95  Temp(Src) 98.3 F (36.8 C) (Oral)  Resp 22  SpO2 100%  LMP 11/30/2014 (Exact Date)  Breastfeeding? No Physical Exam  Constitutional: She is oriented to person, place, and time. She appears well-developed and well-nourished. No distress.  HENT:  Head: Normocephalic and atraumatic.  Right Ear: External ear normal.  Left Ear: External ear normal.  Nose: Nose normal.  Mouth/Throat: Uvula is midline, oropharynx is clear and moist and mucous membranes are normal.  Eyes: Conjunctivae, EOM and lids are normal. Pupils are equal, round, and reactive to light. Right eye exhibits no discharge. Left eye exhibits no discharge. No scleral icterus.  Neck: Normal range of motion. Neck supple.  Cardiovascular: Normal rate, regular rhythm, normal heart sounds, intact distal pulses and normal pulses.   Pulmonary/Chest: Effort normal. No respiratory distress. She has no wheezes. She has no rales. She exhibits no tenderness.  Abdominal: Soft. Normal appearance and bowel sounds are normal. She exhibits no distension and no mass. There is no tenderness. There is no rigidity, no rebound and no guarding.  Musculoskeletal: Normal range of motion. She exhibits no edema or tenderness.  Neurological: She is alert and oriented to person, place, and time. She has normal strength. No cranial nerve deficit or sensory deficit.  Skin: Skin is warm, dry and intact. No rash noted. She is not diaphoretic. No erythema.  No pallor.  Psychiatric: She has a normal mood and affect. Her speech is normal and behavior is normal. Judgment and thought content normal.  Nursing note and vitals reviewed.   ED Course  Procedures (including critical care time)  Labs Review Labs Reviewed  CBC WITH DIFFERENTIAL/PLATELET - Abnormal; Notable for the following:    WBC 11.9 (*)    RBC 3.41 (*)    Hemoglobin 10.7 (*)    HCT 32.1 (*)    RDW 16.5 (*)    All other components within normal limits  BASIC METABOLIC PANEL - Abnormal; Notable for the following:    Calcium 8.6 (*)    All other components within normal limits    Imaging Review No results found.   I have personally reviewed and evaluated these images and lab results as part of my medical decision-making.   EKG Interpretation   Date/Time:  Tuesday November 30 2014 19:33:47 EDT Ventricular Rate:  98 PR Interval:  153 QRS Duration: 68 QT Interval:  375 QTC Calculation: 479 R Axis:   75 Text Interpretation:  Sinus rhythm Nonspecific T  abnormalities, lateral  leads Borderline prolonged QT interval Baseline wander in lead(s) V6 No  significant change since last tracing Confirmed by FLOYD MD, DANIEL  [phone]) on 11/30/2014 10:51:12 PM      MDM   Final diagnoses:  Intermittent lightheadedness  Headache, unspecified headache type   29 year old female presents with daily episodes of lightheadedness x 3 months. Reports headache, dizziness, and blurry vision. Denies fever, chills, chest pain. Reports palpitations and shortness of breath when she feels anxious. Denies abdominal pain, N/V/C, reports intermittent diarrhea. Denies blood in stool. Denies numbness or paresthesia. Reports generalized weakness and fatigue. Tearful when discussing recent loss of her father and stress of taking care of her mother and 2 children.  Patient is afebrile. Vital signs stable. Normal neuro exam with no focal neurological deficit. Strength and sensation intact. RRR; lungs  clear to auscultation bilaterally, no wheezing; abdomen soft, non-distended, and non-tender.  EKG no acute ischemia. CBC with mild leukocytosis, which appears chronic, and hemoglobin 10.7, which is stable from previous. BMP unremarkable.  Orthostatic vitals within normal limits.  Patient given 1L NS bolus. Reports mild headache in the ED, will treat with tylenol.   Pain reports improvement in symptoms s/p fluids and tylenol. Patient discussed with Dr. Adela Lank. Given duration of symptoms, do not feel any additional evaluation or acute intervention is needed at this time. Patient to follow-up with PCP. Return precautions discussed.  BP 131/89 mmHg  Pulse 79  Temp(Src) 98 F (36.7 C) (Oral)  Resp 20  SpO2 95%  LMP 11/30/2014 (Exact Date)  Breastfeeding? No     Mady Gemma, PA-C 12/01/14 0338  Mady Gemma, PA-C 12/01/14 6045  Melene Plan, DO 12/01/14 [phone]

## 2014-12-01 IMAGING — US US OB TRANSVAGINAL
1 series · 14 of 28 positions shown · non-contrast
Comparison: None of this gestation

CLINICAL DATA: Pregnancy with pain and spotting

EXAM:
OBSTETRIC <14 WK US AND TRANSVAGINAL OB US
TECHNIQUE: Both transabdominal and transvaginal ultrasound examinations were
performed for complete evaluation of the gestation as well as the
maternal uterus, adnexal regions, and pelvic cul-de-sac.
Transvaginal technique was performed to assess early pregnancy.

[Series 1: us ob comp less 14 wks · 14 of 28 slices shown]
[im 2/28]
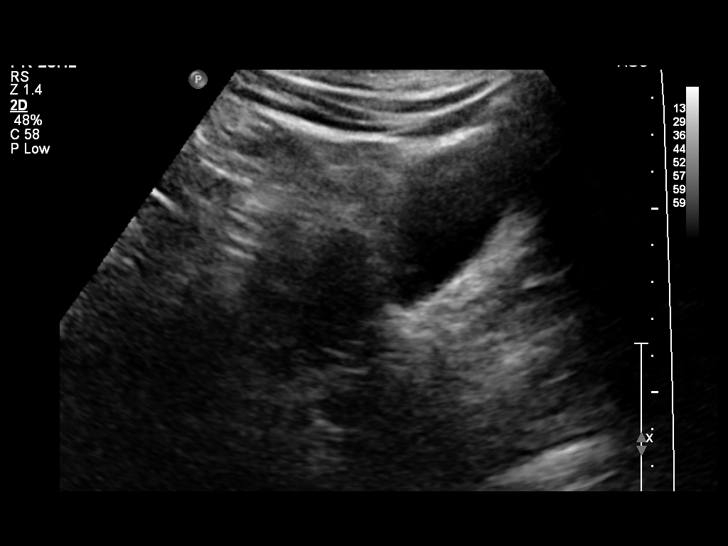
[im 4/28]
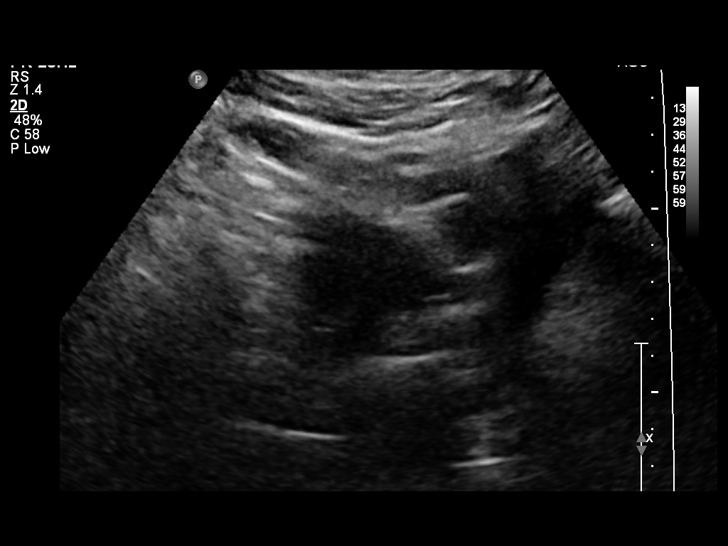
[im 6/28]
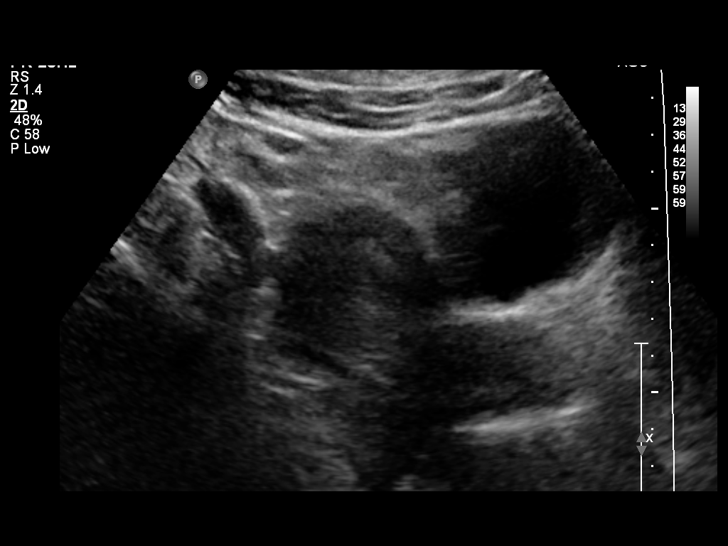
[im 8/28]
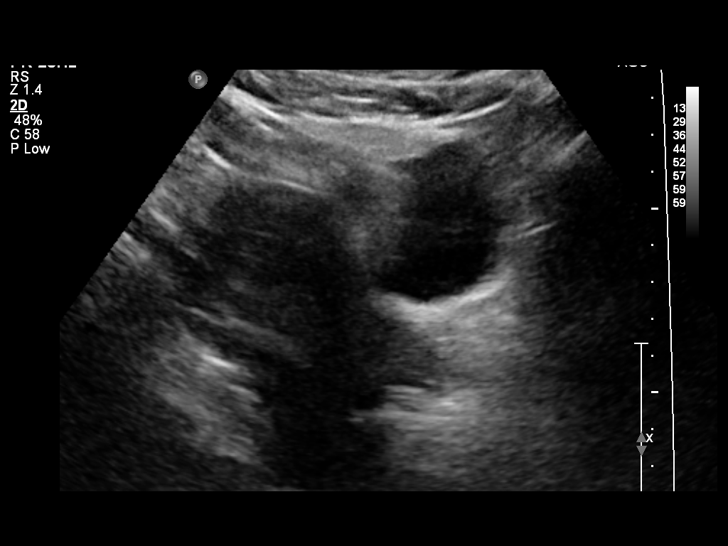
[im 10/28]
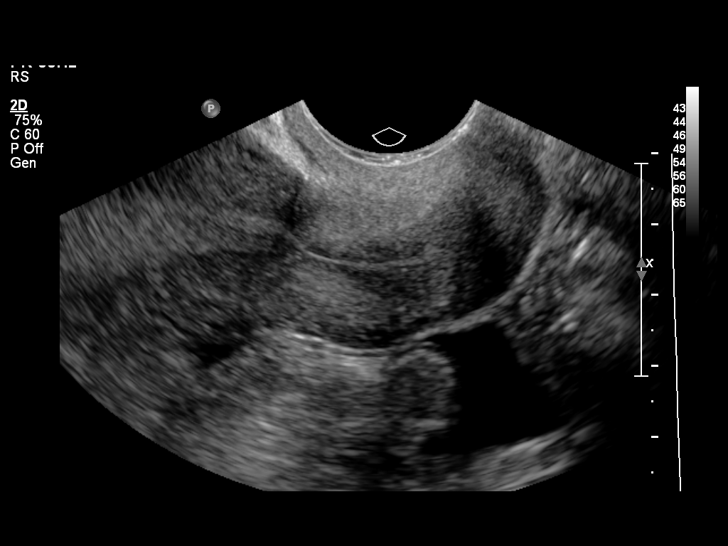
[im 12/28]
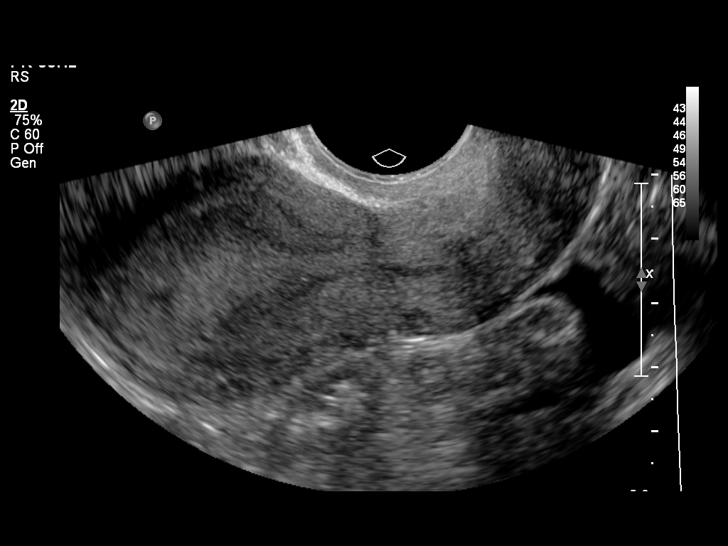
[im 14/28]
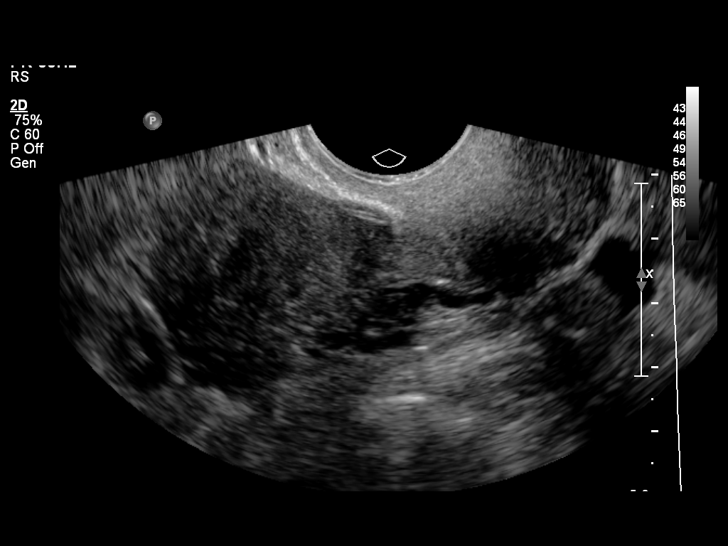
[im 16/28]
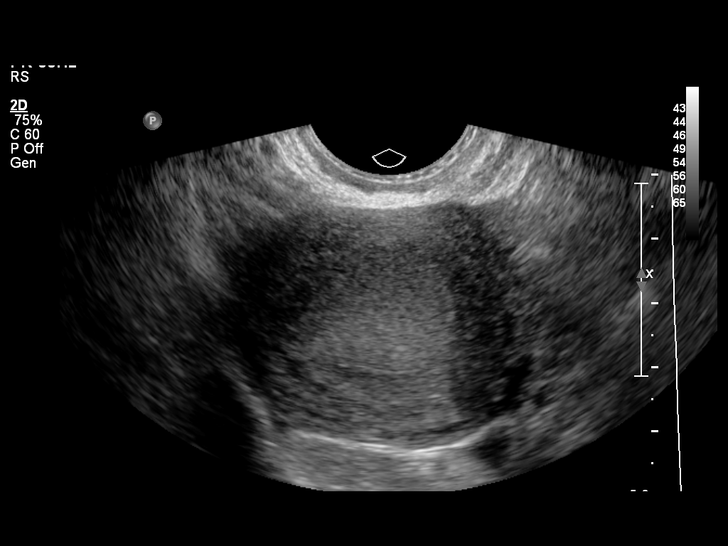
[im 18/28]
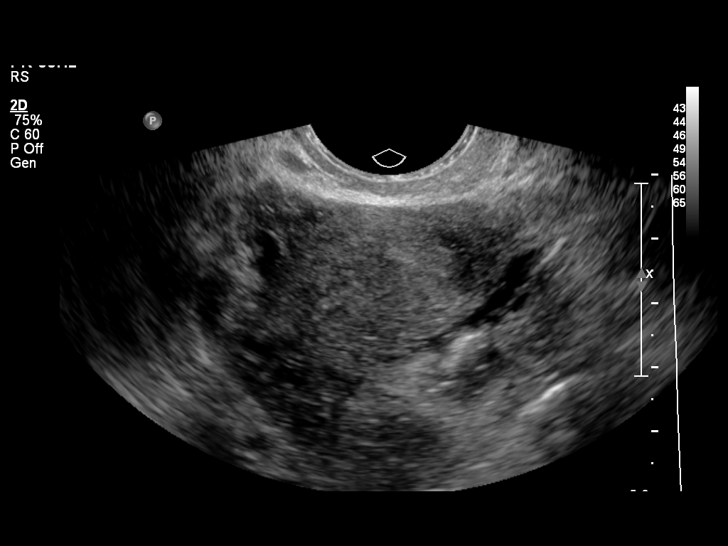
[im 20/28]
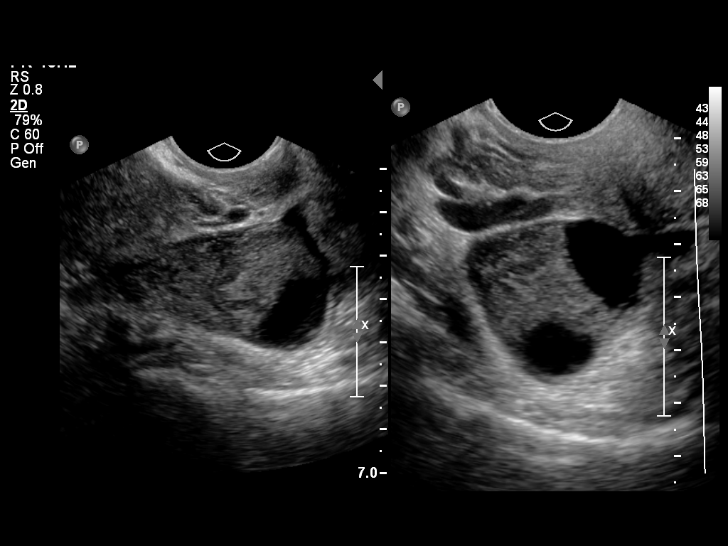
[im 22/28]
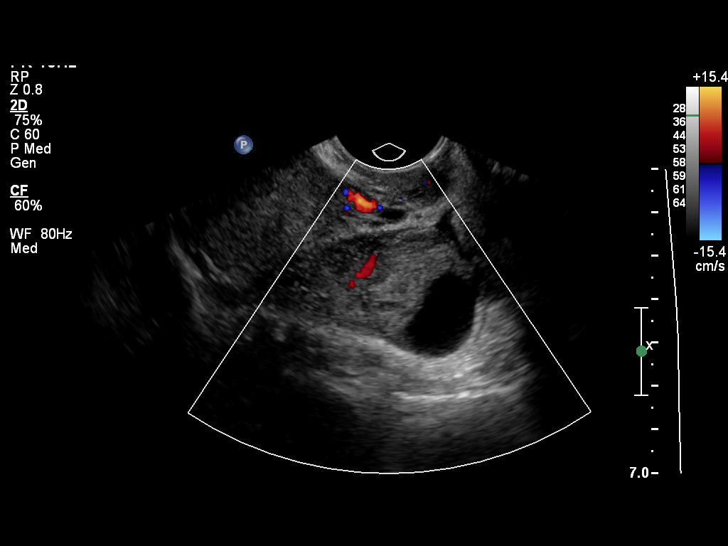
[im 24/28]
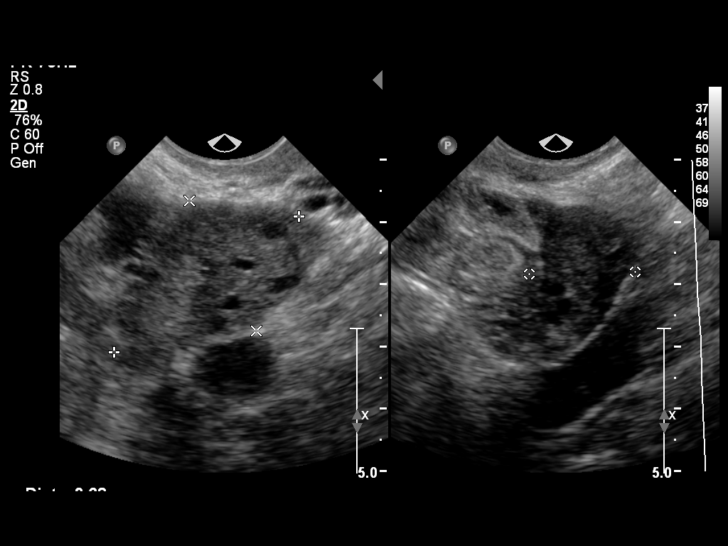
[im 26/28]
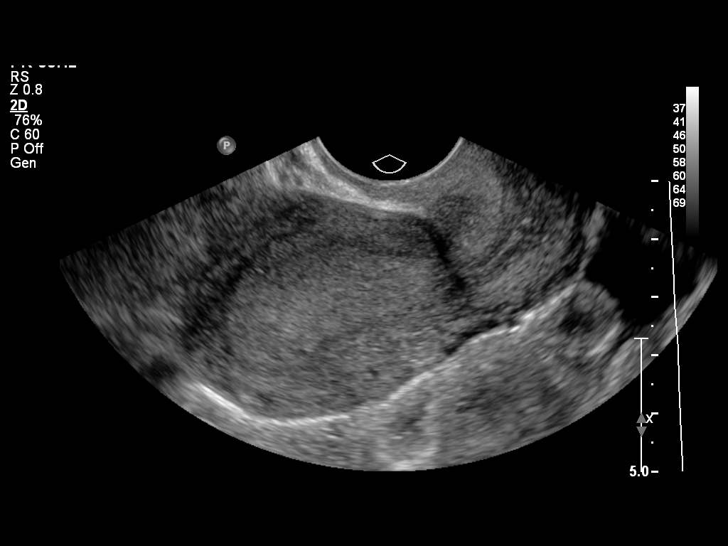
[im 28/28]
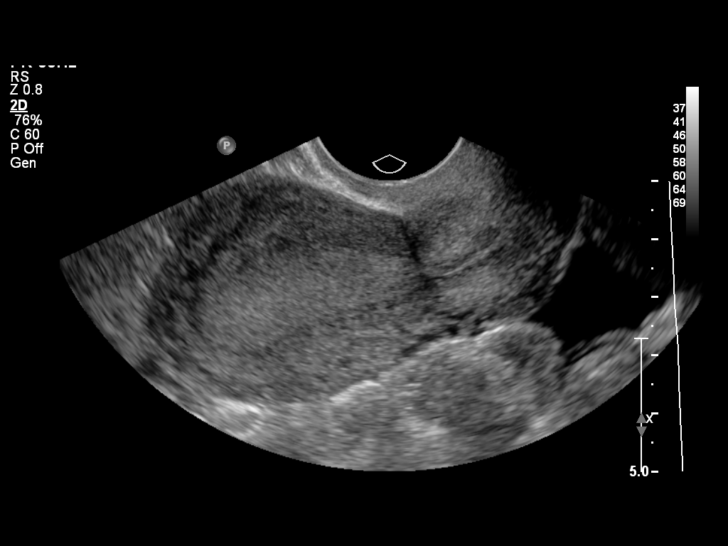

[14 of 28 positions shown; findings below may reference images not displayed]

FINDINGS: No intra or extrauterine gestational sac is identified. The ovaries
are symmetric in size and normal in appearance. There is no adnexal
mass. Small volume free pelvic fluid which appears simple. The
uterus is unremarkable in appearance. Noted quantitative beta HCG of
322.
IMPRESSION: 1. Pregnancy of unknown location. Differential considerations
include intrauterine gestation too early to be sonographically
visualized, spontaneous abortion, or ectopic pregnancy. Consider
follow-up ultrasound in 10 days and serial quantitative beta HCG
follow-up.
2. Small volume, simple pelvic fluid.

## 2014-12-12 IMAGING — US US OB TRANSVAGINAL
1 series · 14 of 25 positions shown · non-contrast
Comparison: October 09, 2013.

CLINICAL DATA: Vaginal spotting, assess viability.

EXAM:
TRANSVAGINAL OB ULTRASOUND
TECHNIQUE: Transvaginal ultrasound was performed for complete evaluation of the
gestation as well as the maternal uterus, adnexal regions, and
pelvic cul-de-sac.

[Series 1: us ob comp less 14 wks · 14 of 25 slices shown]
[im 1/25]
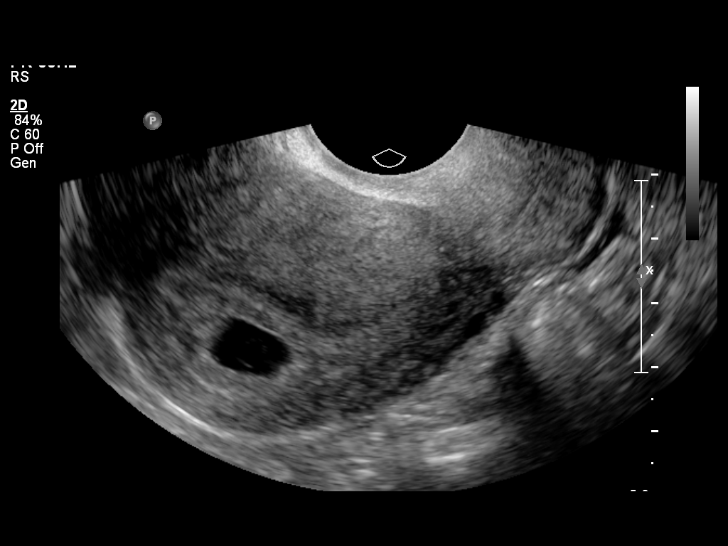
[im 3/25]
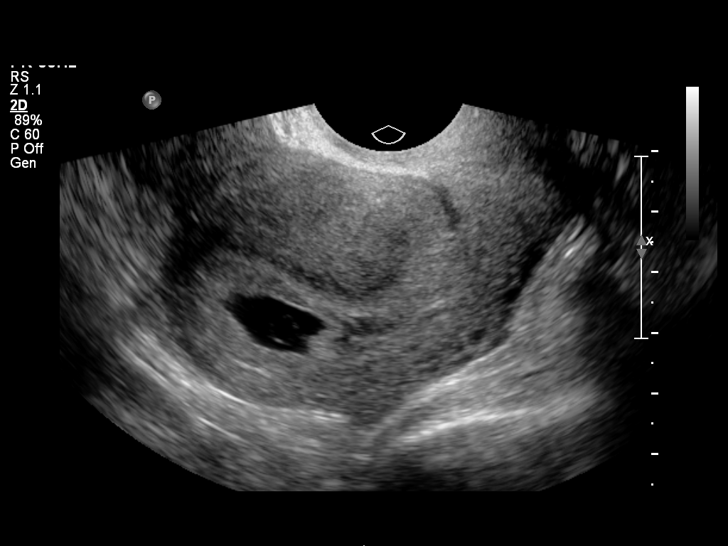
[im 5/25]
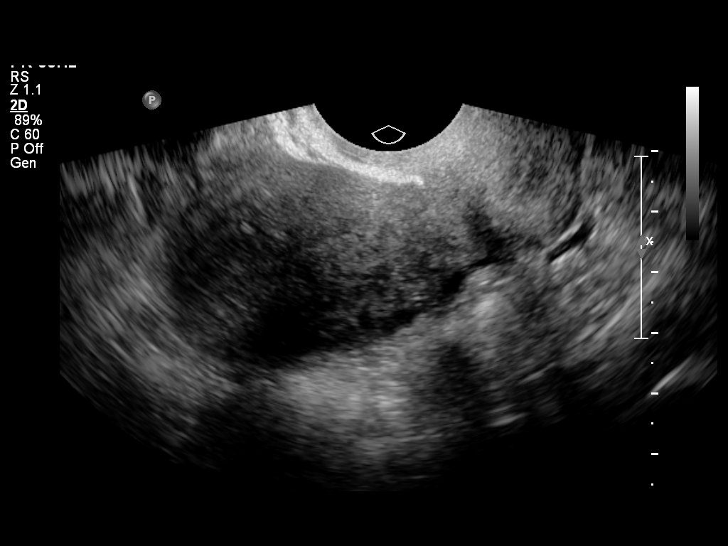
[im 7/25]
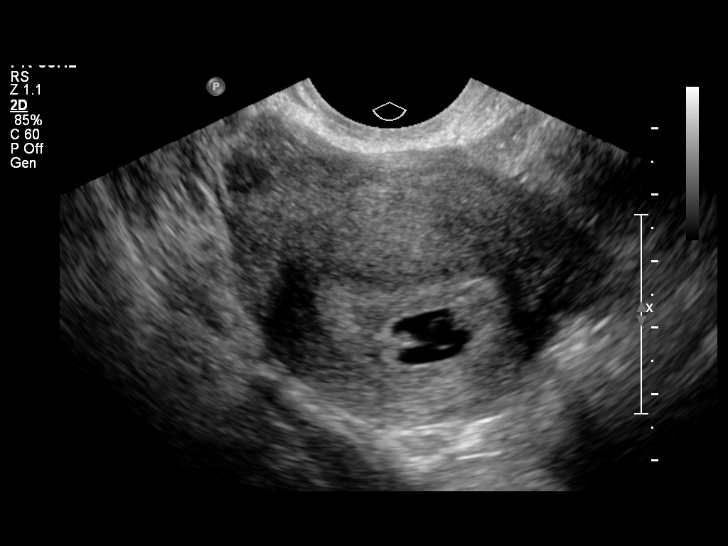
[im 9/25]
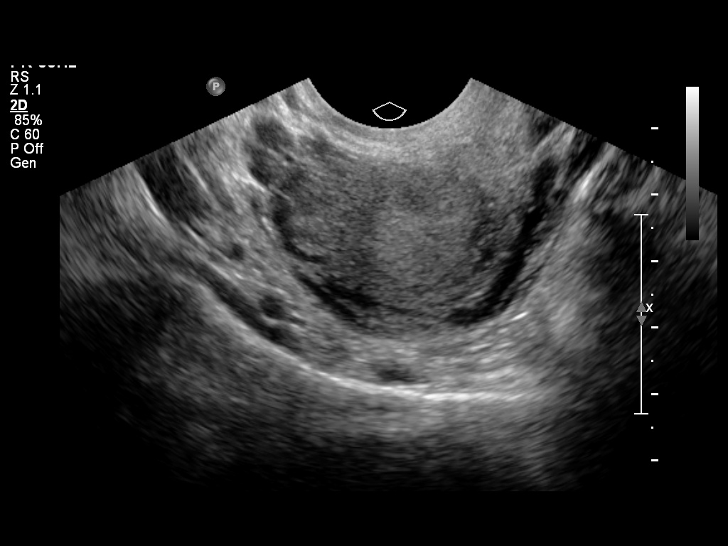
[im 10/25]
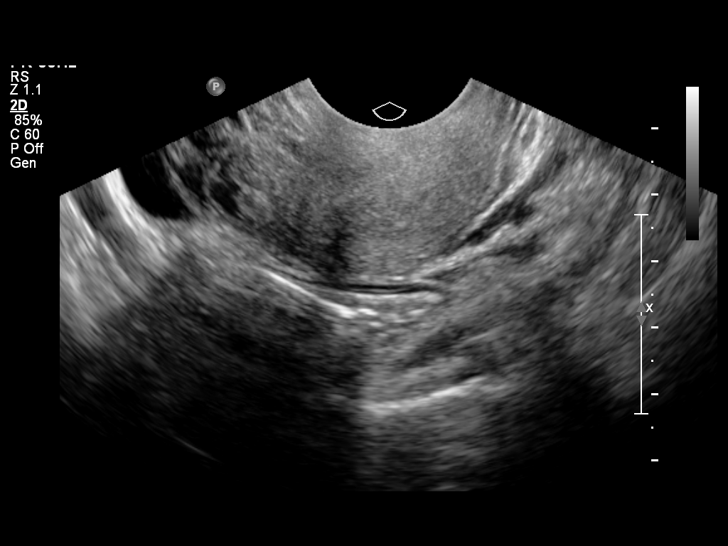
[im 12/25]
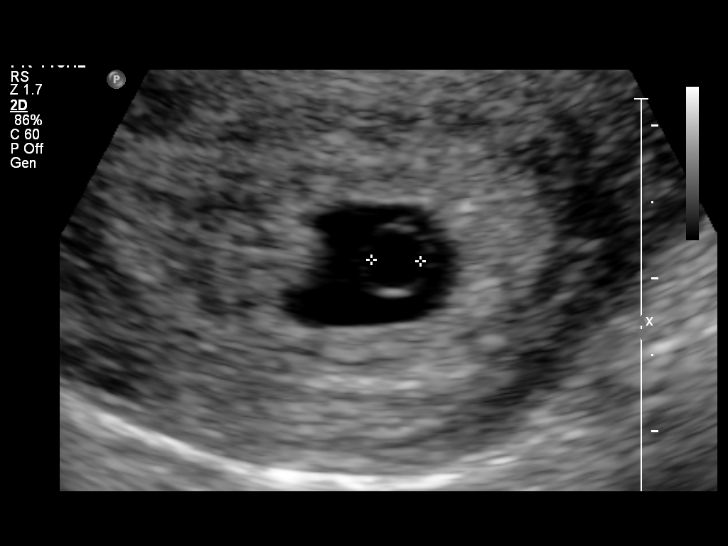
[im 14/25]
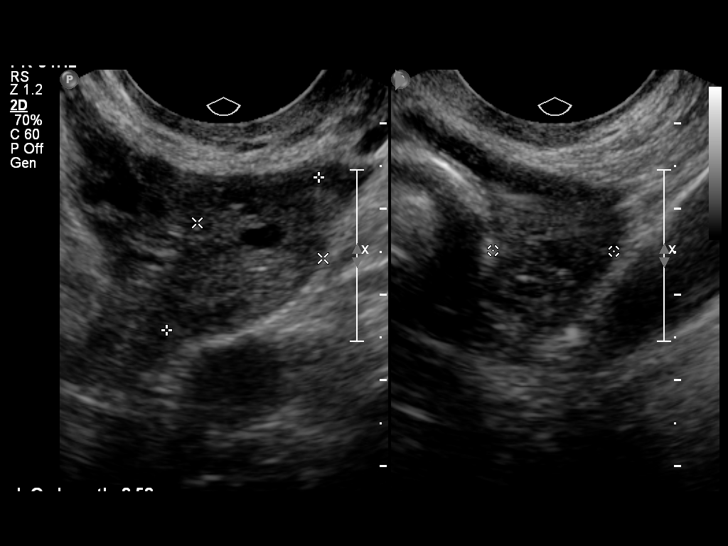
[im 16/25]
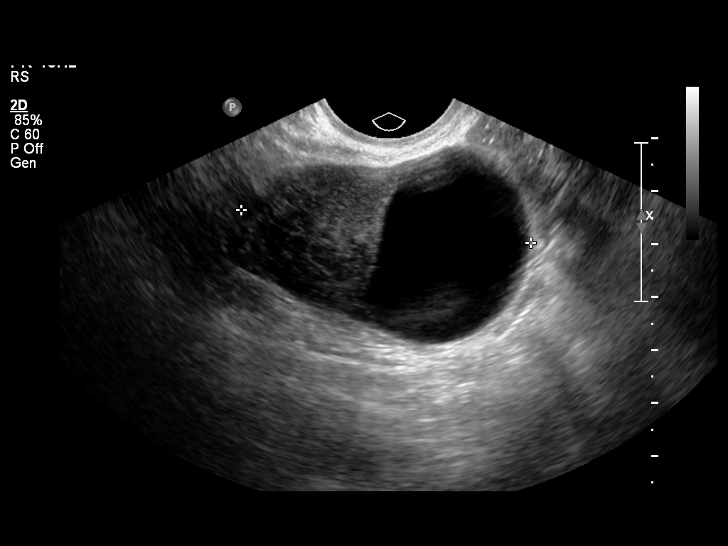
[im 17/25]
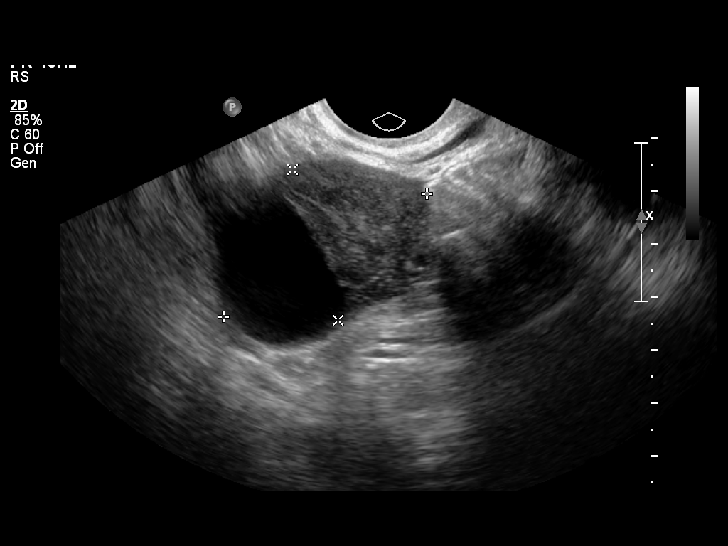
[im 19/25]
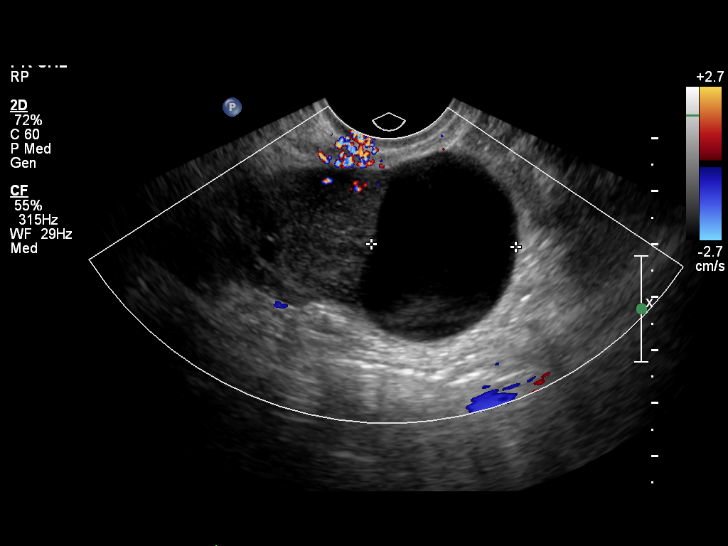
[im 21/25]
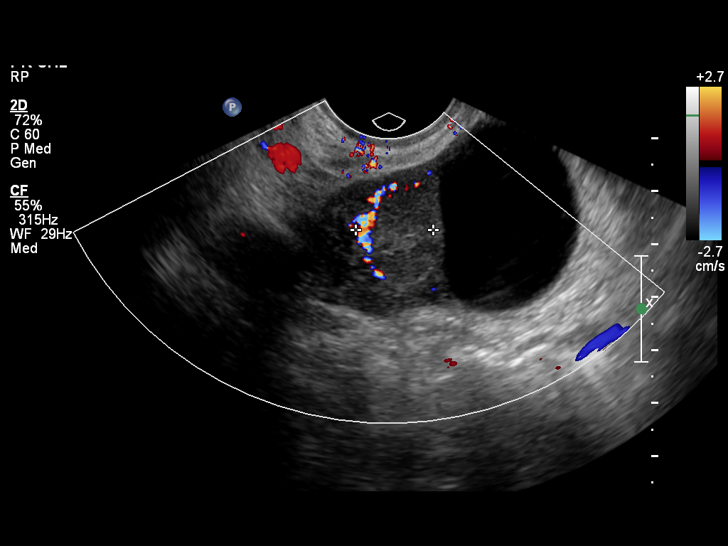
[im 23/25]
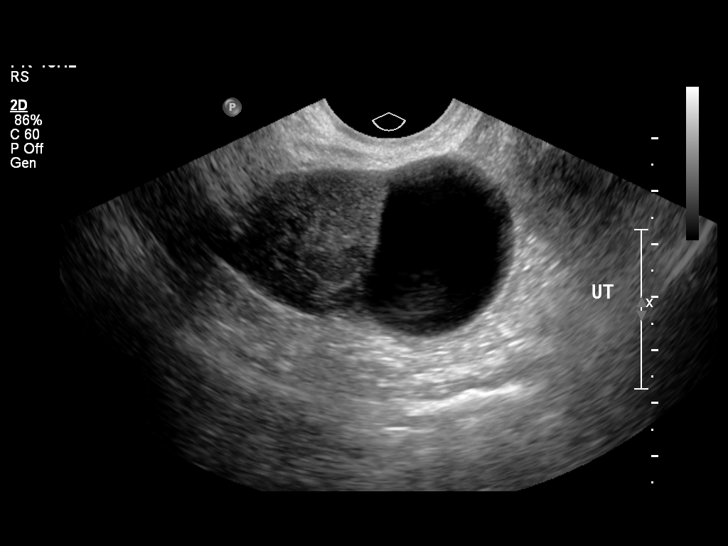
[im 25/25]
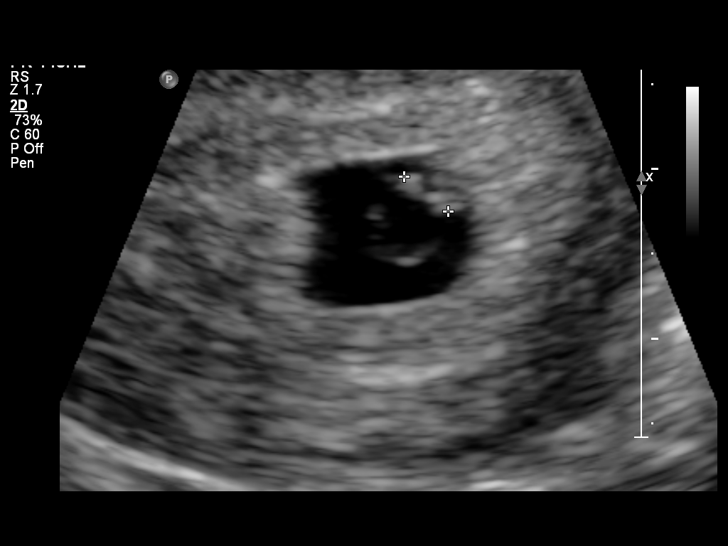

[14 of 25 positions shown; findings below may reference images not displayed]

FINDINGS: Intrauterine gestational sac: Visualized/normal in shape.

Yolk sac:  Visualized.

Embryo:  Visualized.

Cardiac Activity: Not visualized.

MSD: 10.5  mm   5 w   5  d

CRL:   3.3  mm   6 w 0 d                  US EDC: June 15, 2014.

Maternal uterus/adnexae: No hemorrhage is noted. Left ovary appears
normal. Probable corpus luteum cyst seen in right ovary.
IMPRESSION: Probable early intrauterine gestational sac with yolk sac and
probable embryo, but no cardiac activity yet visualized. Recommend
follow-up quantitative B-HCG levels and follow-up US in 14 days to
confirm and assess viability. This recommendation follows SRU
consensus guidelines: Diagnostic Criteria for Nonviable Pregnancy
Early in the First Trimester. N Engl J Med 3007; [DATE].

## 2014-12-30 IMAGING — US US OB TRANSVAGINAL
1 series · 13 of 28 positions shown · non-contrast
Comparison: None.

CLINICAL DATA: Continuous vaginal bleeding.

EXAM:
TRANSVAGINAL OB ULTRASOUND
TECHNIQUE: Transvaginal ultrasound was performed for complete evaluation of the
gestation as well as the maternal uterus, adnexal regions, and
pelvic cul-de-sac.

[Series 1: us ob transvaginal · 13 of 43 slices shown]
[im 2/43]
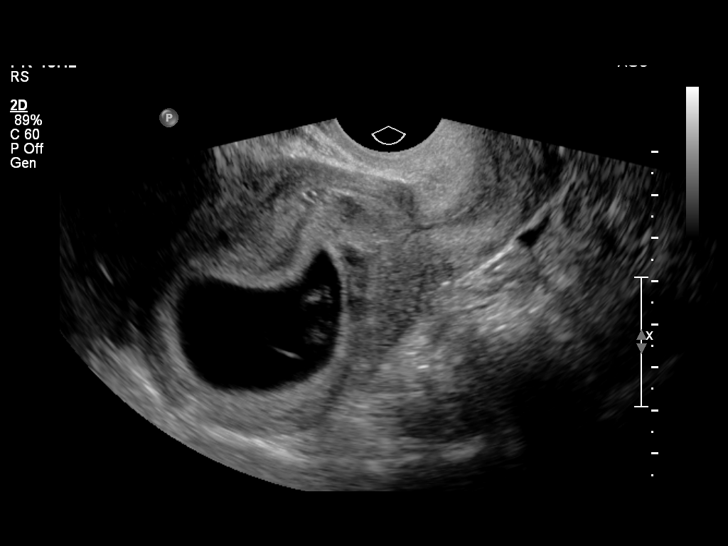
[im 5/43]
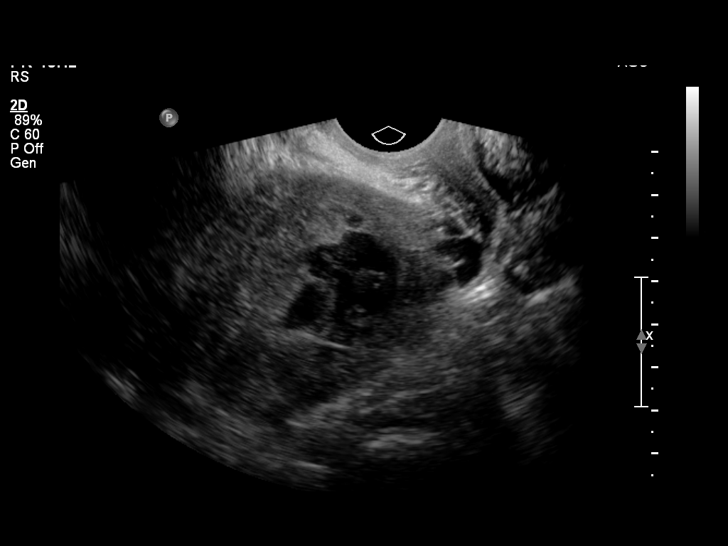
[im 8/43]
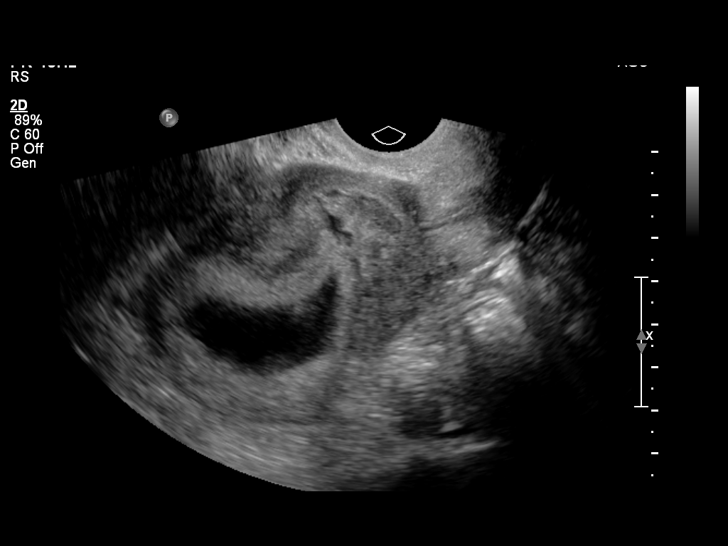
[im 11/43]
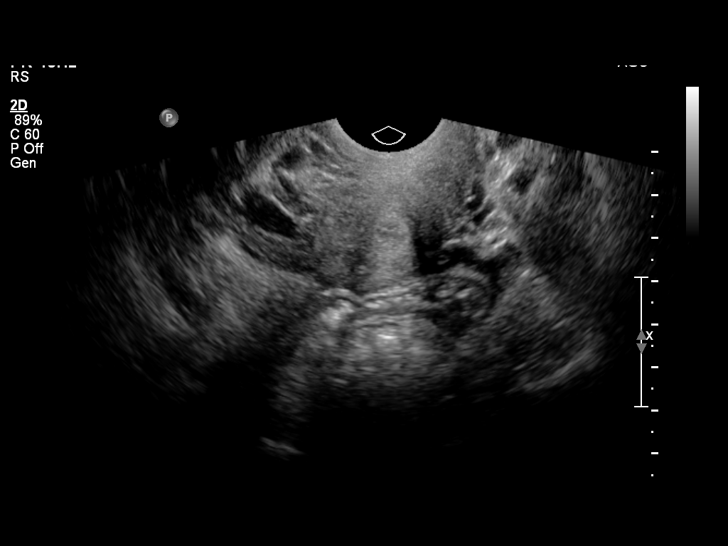
[im 15/43]
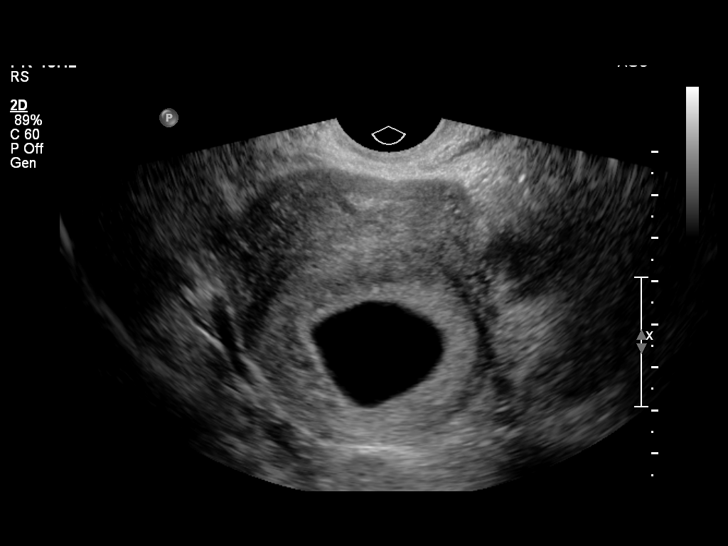
[im 18/43]
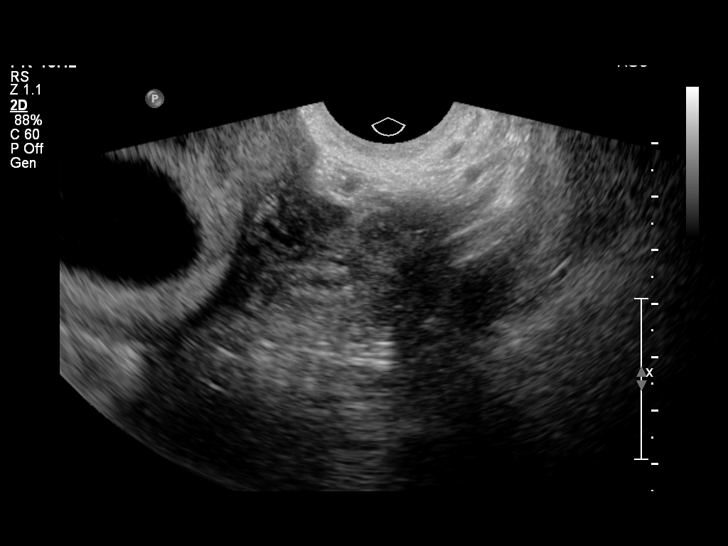
[im 22/43]
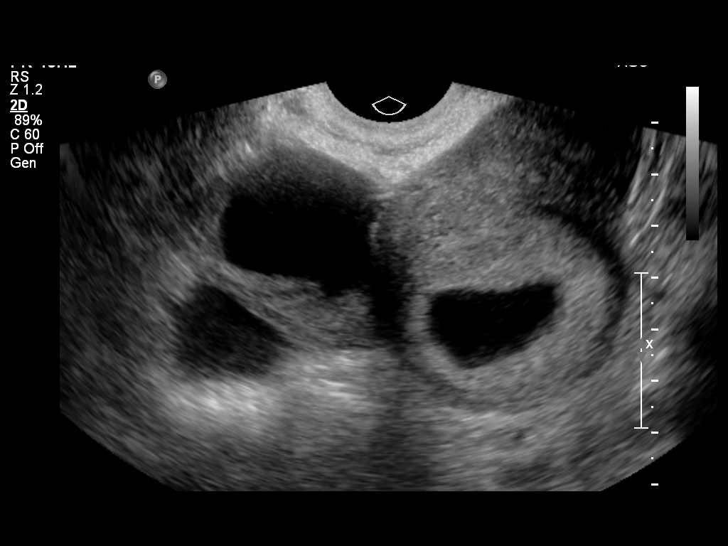
[im 25/43]
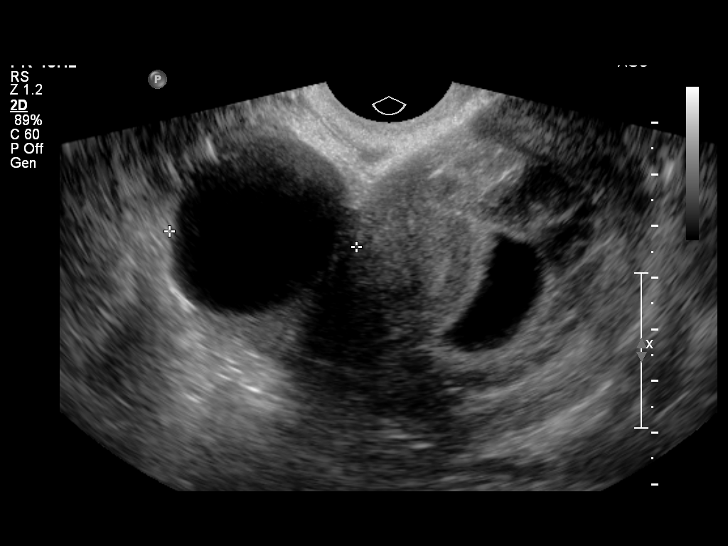
[im 29/43]
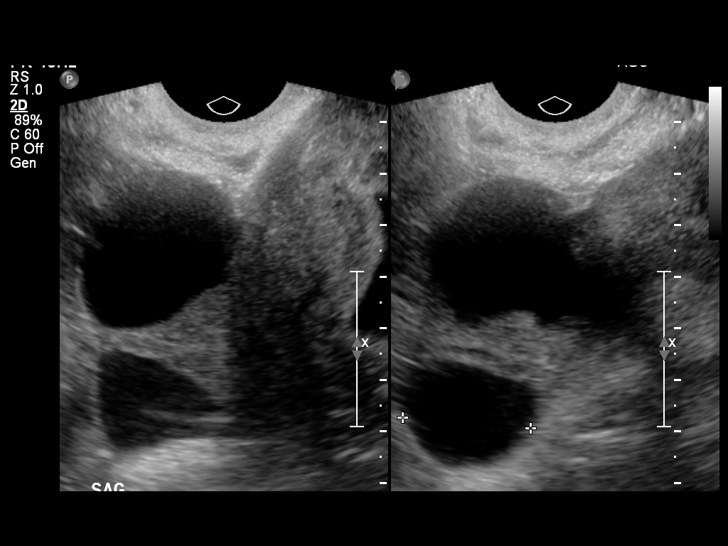
[im 32/43]
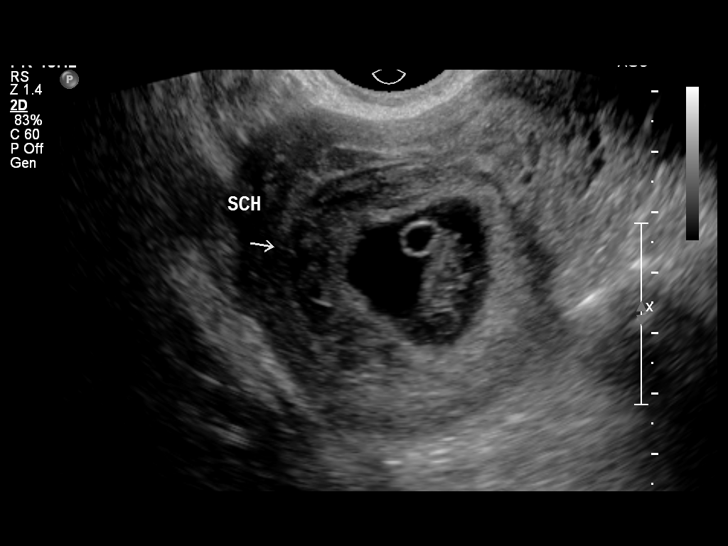
[im 35/43]
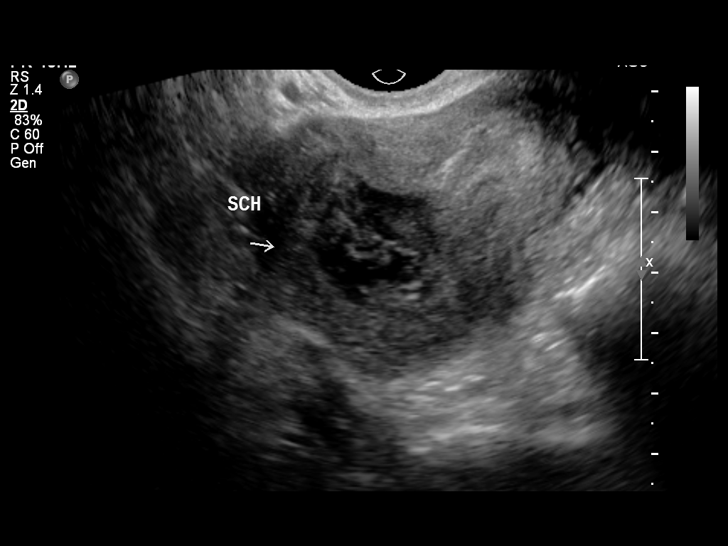
[im 38/43]
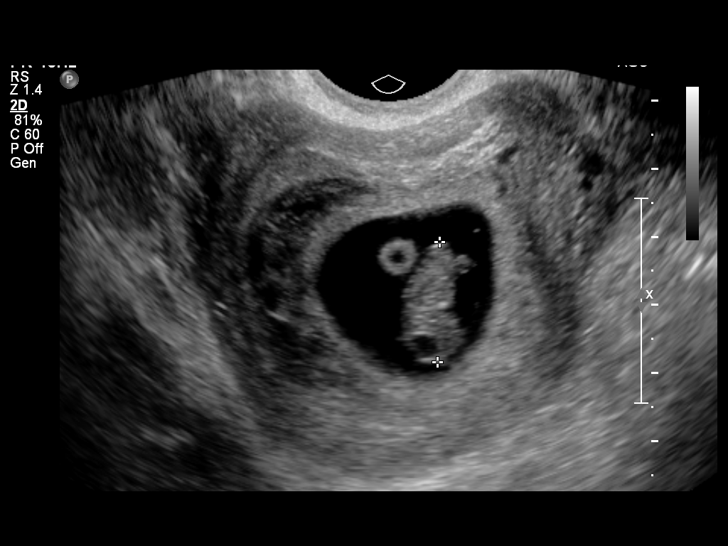
[im 41/43]
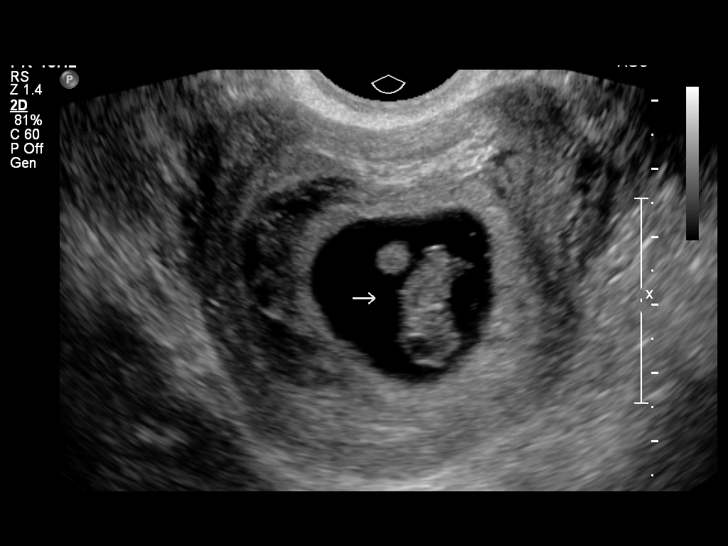

[13 of 28 positions shown; findings below may reference images not displayed]

FINDINGS: Intrauterine gestational sac: Visualized/normal in shape.

Yolk sac:  Yes

Embryo:  Yes

Cardiac Activity: Yes

Heart Rate: 152 bpm

CRL:   1.72 cm   8 w 2 d                  US EDC: 06/17/2014

Maternal uterus/adnexae: A moderate amount of subchorionic
hemorrhage is noted. The uterus is otherwise unremarkable.

Two right ovarian cysts are seen, measuring 3.6 cm and 2.5 cm in
size. These are likely physiologic. The left ovary is unremarkable
in appearance. The right ovary measures approximately 5.7 x 3.6 x
3.6 cm, while the left ovary measures 3.0 x 2.2 x 1.8 cm. No
suspicious adnexal masses are seen; there is no evidence for ovarian
torsion.

No free fluid is seen within the pelvic cul-de-sac.
IMPRESSION: 1. Single live intrauterine pregnancy noted, with a crown-rump
length of 1.7 cm, corresponding to a gestational age of 8 weeks 2
days. This matches the gestational age of 8 weeks 0 days by LMP,
reflecting an estimated date of delivery June 19, 2014.
2. Moderate amount of subchorionic hemorrhage noted.
3. Likely physiologic right ovarian cysts noted.

## 2015-06-03 ENCOUNTER — Ambulatory Visit (INDEPENDENT_AMBULATORY_CARE_PROVIDER_SITE_OTHER): Payer: 59 | Admitting: Family Medicine

## 2015-06-03 VITALS — BP 117/84 | HR 98 | Temp 98.8°F | Wt 194.1 lb

## 2015-06-03 DIAGNOSIS — R1012 Left upper quadrant pain: Secondary | ICD-10-CM | POA: Diagnosis not present

## 2015-06-03 DIAGNOSIS — N898 Other specified noninflammatory disorders of vagina: Secondary | ICD-10-CM | POA: Diagnosis not present

## 2015-06-03 LAB — POCT WET PREP (WET MOUNT): Clue Cells Wet Prep Whiff POC: NEGATIVE

## 2015-06-03 NOTE — Progress Notes (Signed)
   Subjective:    Patient ID: Kerry Chavez, female    DOB: 22-Feb-1986, 30 y.o.   MRN: [id number]  Seen for Same day visit for   CC: Abdominal pain  She reports epigastric/left upper quadrant abdominal pain for the past 3-5 days.  Pain is been sharp and constant.  No radiation.  Worse with eating and movement.  Has not tried any treatments.  Nothing seems to make the pain better. Reports some loose stools and diarrhea at onset of pain that has resolved.  Denies any blood in the stool.  Denies any abdominal surgeries other than C-section. Not currently taking any medications.  Denies nausea, vomiting, heartburn.  Reports lot of gas and stomach noises.  Denies fevers.  Also reports vaginal discharge and odor. Denies vaginal bleeding, itching, pain.   Smoking history noted  Review of Systems   See HPI for ROS. Objective:  BP 117/84 mmHg  Pulse 98  Temp(Src) 98.8 F (37.1 C) (Oral)  Wt 194 lb 1.6 oz (88.043 kg)  General: NAD Cardiac: RRR, normal heart sounds, no murmurs.Respiratory: CTAB, normal effort Abdomen: soft, mild tenderness in epigastric, left upper quadrant and left lower quadrant, nondistended, Bowel sounds present.  No guarding, rebounding Extremities: no edema or cyanosis. WWP. Skin: warm and dry, no rashes noted    Assessment & Plan:   1. Vaginal discharge - POCT Wet [facility] negative  2. Left upper quadrant pain Left upper quadrant pain for 3-5 days associated with gas, loose bowel movements.  Possibly related to viral gastroenteritis versus gas pains versus IBS.  No concerning red flags: Fevers, rectal bleeding. Continues to tolerate oral liquids and maintain hydration.  - Advised treating pain with Tylenol, ibuprofen as needed and following up if no improvement in 3-5 days or develops new or concerning symptoms

## 2016-03-29 ENCOUNTER — Ambulatory Visit: Payer: 59 | Admitting: Family Medicine

## 2016-05-18 ENCOUNTER — Ambulatory Visit: Payer: 59 | Admitting: Family Medicine

## 2018-04-25 ENCOUNTER — Other Ambulatory Visit (HOSPITAL_COMMUNITY)
Admission: RE | Admit: 2018-04-25 | Discharge: 2018-04-25 | Disposition: A | Discharge status: Home / Self Care | Payer: Medicaid Other | Source: Ambulatory Visit | Attending: Family Medicine | Admitting: Family Medicine

## 2018-04-25 ENCOUNTER — Ambulatory Visit (INDEPENDENT_AMBULATORY_CARE_PROVIDER_SITE_OTHER): Payer: Medicaid Other | Admitting: Family Medicine

## 2018-04-25 ENCOUNTER — Other Ambulatory Visit: Payer: Self-pay

## 2018-04-25 ENCOUNTER — Encounter: Payer: Self-pay | Admitting: Family Medicine

## 2018-04-25 VITALS — BP 100/72 | HR 92 | Temp 98.5°F | Ht 61.0 in | Wt 184.8 lb

## 2018-04-25 DIAGNOSIS — Z124 Encounter for screening for malignant neoplasm of cervix: Secondary | ICD-10-CM

## 2018-04-25 DIAGNOSIS — Z113 Encounter for screening for infections with a predominantly sexual mode of transmission: Secondary | ICD-10-CM

## 2018-04-25 DIAGNOSIS — F418 Other specified anxiety disorders: Secondary | ICD-10-CM

## 2018-04-25 LAB — POCT WET PREP (WET MOUNT)
CLUE CELLS WET PREP WHIFF POC: NEGATIVE
Trichomonas Wet Prep HPF POC: ABSENT

## 2018-04-25 MED ORDER — FLUOXETINE HCL 10 MG PO TABS: 10.0000 mg | ORAL_TABLET | Freq: Every day | ORAL | 3 refills | Status: DC

## 2018-04-25 MED ORDER — FLUCONAZOLE 150 MG PO TABS: 150.0000 mg | ORAL_TABLET | Freq: Every day | ORAL | 0 refills | Status: DC

## 2018-04-25 NOTE — Progress Notes (Signed)
Subjective:    Kerry RaCourtney Schommer - 33 y.o. female MRN [id number]  Date of birth: 03-19-1986  CC:  Kerry Chavez is here for pap smear and anxiety/depression.  HPI: Anxiety/depression - feels as if something bad is going to happen - always feels on edge - frequently feels sad and tired and does not sleep well - prays, reads devotional books, but does not feel like she has supportive friends or family and does not want to burden them with her problems -Current stressors include being unemployed, being a single mom to a 35 year old and a 35-year-old, and dealing with the loss of her dad from 5 years ago -Has tried Celexa, bupropion, and BuSpar in the past, but these have been minimally effective -Denies ever making a plan to hurt herself and denies current suicidal ideation  Pap smear - last Pap smear on patient's chart was performed in 2013 and was normal -Patient would also like STI testing, although she is monogamous and has not had any urinary or vaginal symptoms  Health Maintenance:  Health Maintenance Due  Topic Date Due  . PAP SMEAR-Modifier  07/05/2014  . TETANUS/TDAP  09/13/2015  . INFLUENZA VACCINE  10/24/2017    -  reports that she has been smoking cigarettes. She has a 2.50 pack-year smoking history. She has never used smokeless tobacco. - Review of Systems: Per HPI. - Past Medical History: Patient Active Problem List   Diagnosis Date Noted  . Screen for STD (sexually transmitted disease) 04/25/2018  . Screening for malignant neoplasm of cervix 04/25/2018  . TOBACCO USER 12/04/2008  . OBESITY, UNSPECIFIED 11/10/2007  . Depression with anxiety 12/18/2006   - Medications: reviewed and updated   Objective:   Physical Exam BP 100/72   Pulse 92   Temp 98.5 F (36.9 C) (Oral)   Ht 5\' 1"  (1.549 m)   Wt 184 lb 12.8 oz (83.8 kg)   LMP 04/09/2018 (Exact Date)   SpO2 99%   BMI 34.92 kg/m  Gen: NAD, alert, cooperative with exam, well-appearing GU: Normal vulva,  vagina, and cervix.  No abnormal vaginal discharge. Psych: good insight, alert and oriented, tearful during exam, sad mood and affect  [facility] 04/25/2018 04/25/2018 04/02/2013  Decreased Interest 2 2 1   Down, Depressed, Hopeless 2 2 -  PHQ - 2 Score 4 4 1   Altered sleeping 3 - -  Tired, decreased energy 1 - -  Change in appetite 3 - -  Feeling bad or failure about yourself  3 - -  Trouble concentrating 1 - -  Moving slowly or fidgety/restless 0 - -  Suicidal thoughts 0 - -  PHQ-9 Score 15 - -  Difficult doing work/chores Very difficult - -       Assessment & Plan:   Depression with anxiety PHQ 9 is 15 today, which is consistent with moderate to severe depression.  Discussed with patient that the greatest effectiveness has been shown with medication and therapy combined.  We agreed to start Prozac 10 mg nightly for 4 to 6 weeks then return to clinic to evaluate the effectiveness of this medication.  Advised patient that although hopefully she will feel an effect very soon, the full effect of this medication not take place for at least 4 weeks.  Talked with patient that suicide is a reported side effect of this medication, but since she has never made a concrete plan and has no current suicidal ideation, my concern for this is low.  We  will reevaluate in 4 weeks to decide if we should increase this medication or continue the current dose.  Also encouraged patient to make an appointment with behavioral health to continue to talk about her anxiety and depression.  Screen for STD (sexually transmitted disease) Wet prep, GC/chlamydia, HIV and RPR were collected today.  Screening for malignant neoplasm of cervix Pap smear with HPV cotesting was collected today.  Patient advised that if these results are normal, she will not need to get another Pap smear for 5 years, but if they are not, we will let her know.    Lezlie Octave, M.D. 04/25/2018, 11:09 AM PGY-2, [facility]  Family Medicine

## 2018-04-25 NOTE — Addendum Note (Signed)
Addended by: Lezlie Octave C on: 04/25/2018 04:47 PM   Modules accepted: Orders

## 2018-04-25 NOTE — Assessment & Plan Note (Signed)
Wet prep, GC/chlamydia, HIV and RPR were collected today.

## 2018-04-25 NOTE — Assessment & Plan Note (Signed)
PHQ 9 is 15 today, which is consistent with moderate to severe depression.  Discussed with patient that the greatest effectiveness has been shown with medication and therapy combined.  We agreed to start Prozac 10 mg nightly for 4 to 6 weeks then return to clinic to evaluate the effectiveness of this medication.  Advised patient that although hopefully she will feel an effect very soon, the full effect of this medication not take place for at least 4 weeks.  Talked with patient that suicide is a reported side effect of this medication, but since she has never made a concrete plan and has no current suicidal ideation, my concern for this is low.  We will reevaluate in 4 weeks to decide if we should increase this medication or continue the current dose.  Also encouraged patient to make an appointment with behavioral health to continue to talk about her anxiety and depression.

## 2018-04-25 NOTE — Patient Instructions (Addendum)
It was nice meeting you today Kerry Chavez!  I would like to start fluoxetine, or Prozac, today since this medication will hopefully help your mood and help you sleep.  I would take this at night since it can make you feel a bit tired.  Please take this every night for the next 4 to 6 weeks.  Please also make an appointment to see behavioral health.  Today we are going to do a Pap smear and other testing.  We will let you know if this testing is not normal.  If you have any questions or concerns, please feel free to call the clinic.   Be well,  Dr. Frances Furbish

## 2018-04-25 NOTE — Assessment & Plan Note (Signed)
Pap smear with HPV cotesting was collected today.  Patient advised that if these results are normal, she will not need to get another Pap smear for 5 years, but if they are not, we will let her know.

## 2018-04-26 LAB — RPR: RPR Ser Ql: NONREACTIVE

## 2018-04-26 LAB — HIV ANTIBODY (ROUTINE TESTING W REFLEX): HIV Screen 4th Generation wRfx: NONREACTIVE

## 2018-04-28 LAB — CERVICOVAGINAL ANCILLARY ONLY
CHLAMYDIA, DNA PROBE: NEGATIVE
Neisseria Gonorrhea: NEGATIVE

## 2018-04-29 ENCOUNTER — Encounter: Payer: Self-pay | Admitting: Family Medicine

## 2018-04-29 LAB — CYTOLOGY - PAP
DIAGNOSIS: NEGATIVE
HPV: NOT DETECTED

## 2018-04-30 ENCOUNTER — Telehealth: Payer: Self-pay | Admitting: *Deleted

## 2018-04-30 NOTE — Telephone Encounter (Signed)
Pt informed of below and also let her know her blood labs and G/C swab were negative, informed her that the it looks like doctor sent a letter our with results. Lamonte Sakai, April D, [place]

## 2018-04-30 NOTE — Telephone Encounter (Signed)
-----   Message from Lennox Solders, MD sent at 04/30/2018 10:24 AM EST ----- Please let patient know that her pap and HPV were both negative and that she can wait five years before her next pap.  Thanks!

## 2019-02-02 ENCOUNTER — Encounter: Payer: Self-pay | Admitting: Family Medicine

## 2019-02-02 ENCOUNTER — Telehealth: Payer: Self-pay | Admitting: *Deleted

## 2019-02-02 ENCOUNTER — Other Ambulatory Visit (HOSPITAL_COMMUNITY)
Admission: RE | Admit: 2019-02-02 | Discharge: 2019-02-02 | Disposition: A | Discharge status: Home / Self Care | Payer: Medicaid Other | Source: Ambulatory Visit | Attending: Family Medicine | Admitting: Family Medicine

## 2019-02-02 ENCOUNTER — Other Ambulatory Visit: Payer: Self-pay

## 2019-02-02 ENCOUNTER — Ambulatory Visit: Payer: Medicaid Other | Admitting: Family Medicine

## 2019-02-02 VITALS — BP 108/64 | HR 95 | Wt 184.2 lb

## 2019-02-02 DIAGNOSIS — F418 Other specified anxiety disorders: Secondary | ICD-10-CM

## 2019-02-02 DIAGNOSIS — N898 Other specified noninflammatory disorders of vagina: Secondary | ICD-10-CM | POA: Diagnosis not present

## 2019-02-02 LAB — POCT WET PREP (WET MOUNT)
Clue Cells Wet Prep Whiff POC: POSITIVE
Trichomonas Wet Prep HPF POC: ABSENT

## 2019-02-02 MED ORDER — METRONIDAZOLE 500 MG PO TABS: 500.0000 mg | ORAL_TABLET | Freq: Two times a day (BID) | ORAL | 0 refills | Status: AC

## 2019-02-02 MED ORDER — FLUOXETINE HCL 20 MG PO TABS: 20.0000 mg | ORAL_TABLET | Freq: Every day | ORAL | 0 refills | Status: DC

## 2019-02-02 NOTE — Telephone Encounter (Signed)
Dr. Posey Pronto,  It seems that the tablet is not covered but capsules are, see below:      Please change to capsules or let RN team know if you would like to pursue tablets. Christen Bame, CMA

## 2019-02-02 NOTE — Assessment & Plan Note (Signed)
Positive clue cells and Whiff test caused by BV. Pt advised to takes 500mg  BID Metronidazole for 7 days. Counseled pt on BV ie douching.

## 2019-02-02 NOTE — Assessment & Plan Note (Signed)
PHQ9 13 today and GAD 7 20 today which is consistent with moderate to severe depression with severe anxiety. I have doubled the dose of fluoxetine to 20mg  which pt should continue for the next 4 weeks and follow up with myself/PCP in 4 weeks. There is a low risk for suicide as pt denies suicidal ideation. Also provided pt with medic aid approved Behavioral health clinics which she will contact in her own time as she thinks she will benefit from talking therapy and is willing to engage. I also recommended alternative forms of stress management such as meditation, yoga and exercise which pt would benefit from. Pt agreed with plan.

## 2019-02-02 NOTE — Progress Notes (Signed)
   Subjective:    Patient ID: Kerry Chavez, female    DOB: 04/16/1985, 33 y.o.   MRN: [id number]   CC: Kerry Chavez is a 33 yr old female who presents with symptoms of BV and anxiety   HPI:  Unpleasant odor  Pt reports unpleasant "fishy" odor from genital area for the last 2 weeks and thinks she has BV. Denies vaginal discharge or vaginal bleeding, dysuria or hematuria. Has not been sexually active in 5 months with partner but has the same sexual partner for 12 yrs. They do not use condoms. Denies chance of STDs. Partner has had other partners and could have had STD exposure. Pt has bilateral tube ligation. Pap smear was 1 year ago.   Anxiety Pt takes 10mg  Fluoxetine daily which she has been taking for the last year. Usually takes medication at night. Feels anxiety is uncontrolled. Complains of shaking, insomnia and feeling anxious and nervous frequently. Often stays awake until 2am unable to sleep. Misses her father a lot who passed away many years ago who she says was her best friend. She looks after he disabled mother and her 2 children and is stressed. Denies thoughts of self harm, harm to others or suicidal thoughts. Would like another medication for anxiety.  Smoking status reviewed   ROS: pertinent noted in the HPI   Past medical history, surgical, family, and social history reviewed and updated in the EMR as appropriate. Reviewed problem list.   Objective:  BP 108/64   Pulse 95   Wt 184 lb 3.2 oz (83.6 kg)   SpO2 99%   BMI 34.80 kg/m   Vitals and nursing note reviewed  General: NAD, pleasant, able to participate in exam Cardiac: well perfused Respiratory: no acute respiratory distress Extremities: no edema or cyanosis. Skin: warm and dry, no rashes noted Neuro: alert, no obvious focal deficits Psych: anxious mood and anxious affect  Pelvic exam chaperoned by CMA [place] No vulva or vaginal lesions No cervical motion tenderness  No obvious masses felt Mild  tenderness in LIF, no guarding  Speculum exam: Small amount of clear/white discharge seen                                Cervix identified, normal mucosa, not friable                                 Assessment & Plan:    Vaginal odor Positive clue cells and Whiff test caused by BV. Pt advised to takes 500mg  BID Metronidazole for 7 days. Counseled pt on BV ie douching.   Depression with anxiety PHQ9 13 today and GAD 7 20 today which is consistent with moderate to severe depression with severe anxiety. I have doubled the dose of fluoxetine to 20mg  which pt should continue for the next 4 weeks and follow up with myself/PCP in 4 weeks. There is a low risk for suicide as pt denies suicidal ideation. Also provided pt with medic aid approved Behavioral health clinics which she will contact in her own time as she thinks she will benefit from talking therapy and is willing to engage. I also recommended alternative forms of stress management such as meditation, yoga and exercise which pt would benefit from. Pt agreed with plan.   Kerry Haw, MD  [place] PGY-1

## 2019-02-02 NOTE — Patient Instructions (Addendum)
Bacterial Vaginosis  Bacterial vaginosis is a vaginal infection that occurs when the normal balance of bacteria in the vagina is disrupted. It results from an overgrowth of certain bacteria. This is the most common vaginal infection among women ages 52-44. Because bacterial vaginosis increases your risk for STIs (sexually transmitted infections), getting treated can help reduce your risk for chlamydia, gonorrhea, herpes, and HIV (human immunodeficiency virus). Treatment is also important for preventing complications in pregnant women, because this condition can cause an early (premature) delivery. What are the causes? This condition is caused by an increase in harmful bacteria that are normally present in small amounts in the vagina. However, the reason that the condition develops is not fully understood. What increases the risk? The following factors may make you more likely to develop this condition:  Having a new sexual partner or multiple sexual partners.  Having unprotected sex.  Douching.  Having an intrauterine device (IUD).  Smoking.  Drug and alcohol abuse.  Taking certain antibiotic medicines.  Being pregnant. You cannot get bacterial vaginosis from toilet seats, bedding, swimming pools, or contact with objects around you. What are the signs or symptoms? Symptoms of this condition include:  Grey or white vaginal discharge. The discharge can also be watery or foamy.  A fish-like odor with discharge, especially after sexual intercourse or during menstruation.  Itching in and around the vagina.  Burning or pain with urination. Some women with bacterial vaginosis have no signs or symptoms. How is this diagnosed? This condition is diagnosed based on:  Your medical history.  A physical exam of the vagina.  Testing a sample of vaginal fluid under a microscope to look for a large amount of bad bacteria or abnormal cells. Your health care provider may use a cotton swab  or a small wooden spatula to collect the sample. How is this treated? This condition is treated with antibiotics. These may be given as a pill, a vaginal cream, or a medicine that is put into the vagina (suppository). If the condition comes back after treatment, a second round of antibiotics may be needed. Follow these instructions at home: Medicines  Take over-the-counter and prescription medicines only as told by your health care provider.  Take or use your antibiotic as told by your health care provider. Do not stop taking or using the antibiotic even if you start to feel better. General instructions  If you have a female sexual partner, tell her that you have a vaginal infection. She should see her health care provider and be treated if she has symptoms. If you have a female sexual partner, he does not need treatment.  During treatment: ? Avoid sexual activity until you finish treatment. ? Do not douche. ? Avoid alcohol as directed by your health care provider. ? Avoid breastfeeding as directed by your health care provider.  Drink enough water and fluids to keep your urine clear or pale yellow.  Keep the area around your vagina and rectum clean. ? Wash the area daily with warm water. ? Wipe yourself from front to back after using the toilet.  Keep all follow-up visits as told by your health care provider. This is important. How is this prevented?  Do not douche.  Wash the outside of your vagina with warm water only.  Use protection when having sex. This includes latex condoms and dental dams.  Limit how many sexual partners you have. To help prevent bacterial vaginosis, it is best to have sex with just one partner (  monogamous).  Make sure you and your sexual partner are tested for STIs.  Wear cotton or cotton-lined underwear.  Avoid wearing tight pants and pantyhose, especially during summer.  Limit the amount of alcohol that you drink.  Do not use any products that  contain nicotine or tobacco, such as cigarettes and e-cigarettes. If you need help quitting, ask your health care provider.  Do not use illegal drugs. Where to find more information  Centers for Disease Control and Prevention: SolutionApps.co.za  American Sexual Health Association (ASHA): www.ashastd.org  U.S. Department of Health and Health and safety inspector, Office on Women's Health: ConventionalMedicines.si or http://www.anderson-williamson.info/ Contact a health care provider if:  Your symptoms do not improve, even after treatment.  You have more discharge or pain when urinating.  You have a fever.  You have pain in your abdomen.  You have pain during sex.  You have vaginal bleeding between periods. Summary  Bacterial vaginosis is a vaginal infection that occurs when the normal balance of bacteria in the vagina is disrupted.  Because bacterial vaginosis increases your risk for STIs (sexually transmitted infections), getting treated can help reduce your risk for chlamydia, gonorrhea, herpes, and HIV (human immunodeficiency virus). Treatment is also important for preventing complications in pregnant women, because the condition can cause an early (premature) delivery.  This condition is treated with antibiotic medicines. These may be given as a pill, a vaginal cream, or a medicine that is put into the vagina (suppository). This information is not intended to replace advice given to you by your health care provider. Make sure you discuss any questions you have with your health care provider. Document Released: 03/12/2005 Document Revised: 02/22/2017 Document Reviewed: 11/26/2015 Elsevier Patient Education  2020 [employer].   Kerry Chavez,  It was wonderful to meet you today in clinic! Today we checked for Bacterial vaginosis and your test came back positive for it. You can take metronidazole 500 mg twice daily for 7 days. Your chlamydia and gonorrhea tests will come back  in a few days and I will let you know of the result.   I have doubled the dose of your fluoxetine because your anxiety has been troubling you. Please book a follow up appointment in 1 months time with one of our providers. I have also given you a list of behavioral therapists who you can contact for behavioral therapy/CBT which I think will be useful for you.   Wish you the best,  Dr Towanda Octave MD

## 2019-02-03 ENCOUNTER — Other Ambulatory Visit: Payer: Self-pay | Admitting: Family Medicine

## 2019-02-03 LAB — CERVICOVAGINAL ANCILLARY ONLY
Chlamydia: NEGATIVE
Comment: NEGATIVE
Comment: NORMAL
Neisseria Gonorrhea: NEGATIVE

## 2019-02-03 MED ORDER — FLUOXETINE HCL 20 MG PO CAPS: 20.0000 mg | ORAL_CAPSULE | Freq: Every day | ORAL | 3 refills | Status: DC

## 2019-02-03 NOTE — Telephone Encounter (Signed)
Hi Jess, thanks for letting me know. I have changed it to a 20 mg capsule and pended it as I am not yet covered to prescribe under medicaid yet. I will have one of the preceptors sign the script later today.

## 2019-02-04 ENCOUNTER — Encounter: Payer: Self-pay | Admitting: Family Medicine

## 2019-02-24 ENCOUNTER — Ambulatory Visit: Payer: Medicaid Other | Admitting: Family Medicine

## 2019-05-06 ENCOUNTER — Other Ambulatory Visit: Payer: Self-pay

## 2019-05-06 ENCOUNTER — Ambulatory Visit (INDEPENDENT_AMBULATORY_CARE_PROVIDER_SITE_OTHER): Payer: Medicaid Other | Admitting: Family Medicine

## 2019-05-06 VITALS — BP 98/68 | HR 97 | Wt 178.6 lb

## 2019-05-06 DIAGNOSIS — F5089 Other specified eating disorder: Secondary | ICD-10-CM | POA: Diagnosis not present

## 2019-05-06 DIAGNOSIS — D649 Anemia, unspecified: Secondary | ICD-10-CM

## 2019-05-06 DIAGNOSIS — R42 Dizziness and giddiness: Secondary | ICD-10-CM | POA: Diagnosis present

## 2019-05-06 MED ORDER — METRONIDAZOLE 500 MG PO TABS: 500.0000 mg | ORAL_TABLET | Freq: Two times a day (BID) | ORAL | 0 refills | Status: DC

## 2019-05-06 MED ORDER — FERROUS SULFATE 324 (65 FE) MG PO TBEC: 1.0000 | DELAYED_RELEASE_TABLET | Freq: Every day | ORAL | 0 refills | Status: DC

## 2019-05-06 MED ORDER — FLUOXETINE HCL 40 MG PO CAPS: 40.0000 mg | ORAL_CAPSULE | Freq: Every day | ORAL | 0 refills | Status: DC

## 2019-05-06 NOTE — Patient Instructions (Addendum)
It was great meeting you today!  Your diet is consistent with someone who is anemic.  We will get a check of your iron as well as check of your blood.  Because at all you been eating for little while I will also check your electrolyte levels.  I sent in iron tablets.  I increased level your Prozac to 40 mg daily.  Please take a half dose for the first 3 days to avoid any side effects.  Lastly I refilled your Flagyl for the BV.  If you fail to improve with the Flagyl please follow-up.  I would like to see you back in about a month or so to recheck your blood count after the iron and see how you are doing on the Prozac.

## 2019-05-07 ENCOUNTER — Encounter: Payer: Self-pay | Admitting: Family Medicine

## 2019-05-07 DIAGNOSIS — D649 Anemia, unspecified: Secondary | ICD-10-CM | POA: Insufficient documentation

## 2019-05-07 DIAGNOSIS — F5089 Other specified eating disorder: Secondary | ICD-10-CM | POA: Insufficient documentation

## 2019-05-07 LAB — CBC WITH DIFFERENTIAL/PLATELET
Basophils Absolute: 0.1 10*3/uL (ref 0.0–0.2)
Basos: 1 %
EOS (ABSOLUTE): 0.1 10*3/uL (ref 0.0–0.4)
Eos: 1 %
Hematocrit: 34.6 % (ref 34.0–46.6)
Hemoglobin: 10.8 g/dL — ABNORMAL LOW (ref 11.1–15.9)
Immature Grans (Abs): 0 10*3/uL (ref 0.0–0.1)
Immature Granulocytes: 0 %
Lymphocytes Absolute: 2.1 10*3/uL (ref 0.7–3.1)
Lymphs: 21 %
MCH: 27.9 pg (ref 26.6–33.0)
MCHC: 31.2 g/dL — ABNORMAL LOW (ref 31.5–35.7)
MCV: 89 fL (ref 79–97)
Monocytes Absolute: 0.5 10*3/uL (ref 0.1–0.9)
Monocytes: 6 %
Neutrophils Absolute: 7 10*3/uL (ref 1.4–7.0)
Neutrophils: 71 %
Platelets: 323 10*3/uL (ref 150–450)
RBC: 3.87 x10E6/uL (ref 3.77–5.28)
RDW: 16.9 % — ABNORMAL HIGH (ref 11.7–15.4)
WBC: 9.7 10*3/uL (ref 3.4–10.8)

## 2019-05-07 LAB — BASIC METABOLIC PANEL
BUN/Creatinine Ratio: 13 (ref 9–23)
BUN: 8 mg/dL (ref 6–20)
CO2: 21 mmol/L (ref 20–29)
Calcium: 8.6 mg/dL — ABNORMAL LOW (ref 8.7–10.2)
Chloride: 108 mmol/L — ABNORMAL HIGH (ref 96–106)
Creatinine, Ser: 0.61 mg/dL (ref 0.57–1.00)
GFR calc Af Amer: 136 mL/min/{1.73_m2} (ref >59)
GFR calc non Af Amer: 118 mL/min/{1.73_m2} (ref >59)
Glucose: 73 mg/dL (ref 65–99)
Potassium: 4.4 mmol/L (ref 3.5–5.2)
Sodium: 139 mmol/L (ref 134–144)

## 2019-05-07 LAB — IRON,TIBC AND FERRITIN PANEL
Ferritin: 10 ng/mL — ABNORMAL LOW (ref 15–150)
Iron Saturation: 5 % — CL (ref 15–55)
Iron: 18 ug/dL — ABNORMAL LOW (ref 27–159)
Total Iron Binding Capacity: 398 ug/dL (ref 250–450)
UIBC: 380 ug/dL (ref 131–425)

## 2019-05-07 LAB — MAGNESIUM: Magnesium: 2 mg/dL (ref 1.6–2.3)

## 2019-05-07 LAB — PHOSPHORUS: Phosphorus: 3.4 mg/dL (ref 3.0–4.3)

## 2019-05-07 NOTE — Assessment & Plan Note (Signed)
Patient has been almost exclusively eating laundry starch. She feels that this is related to anemia. She had taken over the counter iron pills in the past which did help her pica a little bit. Recommended stopping eating the starch and replacing with well balanced diet. Started on ferrous sulfate 325mg  daily. Recheck iron in 6-8 weeks. Also drew CMP, mg, phos to evaluate electrolytes. All came back normal, no intervention needed.

## 2019-05-07 NOTE — Assessment & Plan Note (Signed)
Patient's pica almost certainly related to anemia. Will draw iron panel and cbc to evaluate the severity. Prescribed 325mg  ferrous sulfate daily. Can come back in 6-8 weeks for recheck.

## 2019-05-07 NOTE — Progress Notes (Signed)
   CHIEF COMPLAINT / HPI: 34 year old female who presents with fatigue, mild shortness of breath with exertion, and dietary changes. The patient almost exclusively eats laundry starch. She developed the habit around 5 years ago when she was pregnany. She had anemia of pregnancy. She has not taken any iron supplementation for several years. She thinks she took it for a couple of months after pregnancy. She has not noticed any bleeding from her stool. She feels that the starch constipates her a little bit and will occasionally take miralax.  PERTINENT  PMH / PSH: history of anemia    OBJECTIVE: BP 98/68   Pulse 97   Wt 178 lb 9.6 oz (81 kg)   SpO2 99%   BMI 33.75 kg/m   Gen: 34 year old female, no acute distress, comfortable HEENT: no conjunctival pallor appreciate CV: tachycardic, regular rhythm, no m/r/g Resp: lungs clear to auscultation bilaterally Abd: soft, non-tender, non-distended Neuro: Alert and oriented, Speech clear, No gross deficits   ASSESSMENT / PLAN:  Anemia Patient's pica almost certainly related to anemia. Will draw iron panel and cbc to evaluate the severity. Prescribed 325mg  ferrous sulfate daily. Can come back in 6-8 weeks for recheck.  Pica in adults Patient has been almost exclusively eating laundry starch. She feels that this is related to anemia. She had taken over the counter iron pills in the past which did help her pica a little bit. Recommended stopping eating the starch and replacing with well balanced diet. Started on ferrous sulfate 325mg  daily. Recheck iron in 6-8 weeks. Also drew CMP, mg, phos to evaluate electrolytes. All came back normal, no intervention needed.  MD PGY-3 Family Medicine Resident [facility]

## 2019-05-28 ENCOUNTER — Other Ambulatory Visit: Payer: Self-pay | Admitting: Family Medicine

## 2019-11-25 ENCOUNTER — Encounter: Payer: Self-pay | Admitting: Family Medicine

## 2019-11-25 ENCOUNTER — Other Ambulatory Visit: Payer: Self-pay

## 2019-11-25 ENCOUNTER — Ambulatory Visit (INDEPENDENT_AMBULATORY_CARE_PROVIDER_SITE_OTHER): Payer: Medicaid Other | Admitting: Family Medicine

## 2019-11-25 ENCOUNTER — Ambulatory Visit (HOSPITAL_COMMUNITY)
Admission: RE | Admit: 2019-11-25 | Discharge: 2019-11-25 | Disposition: A | Discharge status: Home / Self Care | Payer: Medicaid Other | Source: Ambulatory Visit | Attending: Family Medicine | Admitting: Family Medicine

## 2019-11-25 VITALS — BP 108/68 | HR 93 | Ht 61.0 in | Wt 180.6 lb

## 2019-11-25 DIAGNOSIS — M67472 Ganglion, left ankle and foot: Secondary | ICD-10-CM | POA: Diagnosis not present

## 2019-11-25 DIAGNOSIS — D509 Iron deficiency anemia, unspecified: Secondary | ICD-10-CM

## 2019-11-25 DIAGNOSIS — F418 Other specified anxiety disorders: Secondary | ICD-10-CM | POA: Diagnosis not present

## 2019-11-25 DIAGNOSIS — R002 Palpitations: Secondary | ICD-10-CM | POA: Insufficient documentation

## 2019-11-25 MED ORDER — CITALOPRAM HYDROBROMIDE 10 MG PO TABS: 10.0000 mg | ORAL_TABLET | Freq: Every day | ORAL | 1 refills | Status: DC

## 2019-11-25 NOTE — Progress Notes (Signed)
    SUBJECTIVE:   CHIEF COMPLAINT / HPI:  Heart palpitations: Patient states that she feels like her heart is "skipping a beat".  She also states that it is sometimes racing.  He is symptoms last 2 to 3 minutes.  She has them at least once a day.  They happen when she is standing or sitting.  She drinks coffee and smokes half a pack to 1 pack of cigarettes per day.  She does not drink alcohol.  Patient states that she feels anxious during the day.  She has not been taking her Prozac because she did not feel like it was helping.  Patient states that she has had heavy periods since getting her tubal ligation.  She tried taking iron prior to this but it made her constipated.  She craves [place] starch.  Cyst on left foot: Patient states that for the past 3-4 months she has noticed a bump on the top of her left foot.  It is not painful and that she putting on her shoes.  She still has feeling in her foot.   PERTINENT  PMH / PSH: Anemia, anxiety  OBJECTIVE:   BP 108/68   Pulse 93   Ht 5\' 1"  (1.549 m)   Wt 180 lb 9.6 oz (81.9 kg)   LMP 11/18/2019 (Exact Date)   SpO2 99%   BMI 34.12 kg/m   General: Alert, oriented.  No acute distress. HEENT: Normal palpebral conjunctiva CV: Regular rate and rhythm, no murmurs. Pulmonary: L CTA B. Skin: Small fluctuant subcutaneous cyst on the dorsal aspect of the left foot. Psych: Pleasant affect.  Somewhat anxious appearing.  ASSESSMENT/PLAN:   Heart palpitations Based on patient's description sounds like PVCs with possible anxiety component.  EKG performed in the office was negative.  We will send in an order for 48-hour Holter monitor.  We will get TSH and CBC to look for other causes of palpitations.  We will follow up with patient after results of Holter monitor.  Anemia Patient has history of anemia and pica in the form of starch eating.  She has not been taking her iron pills due to gastrointestinal intolerance.  We will fill out the forms for IV  Feraheme due to her intolerance.  Will get CBC to follow-up on anemia as well as B12 given her poor diet and starch which may be blocking absorption of nutrients.  Depression with anxiety Patient appears anxious.  Endorsed general sensation of anxiety on most days.  GAD-7 was positive.  Not currently taking any medication.  Prescribed 10 mg Celexa for patient as this has shown benefit in GAD.  Follow-up in 1 month.  Ganglion cyst of left foot Currently not bothering patient.  She does not want to have it treated at this time.  She is aware that if it becomes bothersome she can always reschedule another appointment with 11/20/2019.     [place], MD [facility]

## 2019-11-25 NOTE — Patient Instructions (Addendum)
It was nice to meet you today,  I have started you on a medication called Celexa for anxiety.  The most common side effect is upset stomach.  To help decrease the side effects you should take the medication at night so that you sleep through them.  The side effects usually get better within the first 1 to 2 weeks.  Please continue to take this medicine for at least 1 month before stopping as long as you can tolerate it.  I would like to see you back in 1 month to discuss your anxiety.  I will also check your blood for anemia and vitamin deficiency.  Someone will call you from the cardiologist office to place the heart monitor on you.  Have a great day,  Frederic Jericho, MD  Citalopram tablets What is this medicine? CITALOPRAM (sye TAL oh pram) is a medicine for depression. This medicine may be used for other purposes; ask your health care provider or pharmacist if you have questions. COMMON BRAND NAME(S): Celexa What should I tell my health care provider before I take this medicine? They need to know if you have any of these conditions:  bleeding disorders  bipolar disorder or a family history of bipolar disorder  glaucoma  heart disease  history of irregular heartbeat  kidney disease  liver disease  low levels of magnesium or potassium in the blood  receiving electroconvulsive therapy  seizures  suicidal thoughts, plans, or attempt; a previous suicide attempt by you or a family member  take medicines that treat or prevent blood clots  thyroid disease  an unusual or allergic reaction to citalopram, escitalopram, other medicines, foods, dyes, or preservatives  pregnant or trying to become pregnant  breast-feeding How should I use this medicine? Take this medicine by mouth with a glass of water. Follow the directions on the prescription label. You can take it with or without food. Take your medicine at regular intervals. Do not take your medicine more often than directed.  Do not stop taking this medicine suddenly except upon the advice of your doctor. Stopping this medicine too quickly may cause serious side effects or your condition may worsen. A special MedGuide will be given to you by the pharmacist with each prescription and refill. Be sure to read this information carefully each time. Talk to your pediatrician regarding the use of this medicine in children. Special care may be needed. Patients over 60 years old may have a stronger reaction and need a smaller dose. Overdosage: If you think you have taken too much of this medicine contact a poison control center or emergency room at once. NOTE: This medicine is only for you. Do not share this medicine with others. What if I miss a dose? If you miss a dose, take it as soon as you can. If it is almost time for your next dose, take only that dose. Do not take double or extra doses. What may interact with this medicine? Do not take this medicine with any of the following medications:  certain medicines for fungal infections like fluconazole, itraconazole, ketoconazole, posaconazole, voriconazole  cisapride  dronedarone  escitalopram  linezolid  MAOIs like Carbex, Eldepryl, Marplan, Nardil, and Parnate  methylene blue (injected into a vein)  pimozide  thioridazine This medicine may also interact with the following medications:  alcohol  amphetamines  aspirin and aspirin-like medicines  carbamazepine  certain medicines for depression, anxiety, or psychotic disturbances  certain medicines for infections like chloroquine, clarithromycin, erythromycin, furazolidone, isoniazid,  pentamidine  certain medicines for migraine headaches like almotriptan, eletriptan, frovatriptan, naratriptan, rizatriptan, sumatriptan, zolmitriptan  certain medicines for sleep  certain medicines that treat or prevent blood clots like dalteparin, enoxaparin,  warfarin  cimetidine  diuretics  dofetilide  fentanyl  lithium  methadone  metoprolol  NSAIDs, medicines for pain and inflammation, like ibuprofen or naproxen  omeprazole  other medicines that prolong the QT interval (cause an abnormal heart rhythm)  procarbazine  rasagiline  supplements like St. John's wort, kava kava, valerian  tramadol  tryptophan  ziprasidone This list may not describe all possible interactions. Give your health care provider a list of all the medicines, herbs, non-prescription drugs, or dietary supplements you use. Also tell them if you smoke, drink alcohol, or use illegal drugs. Some items may interact with your medicine. What should I watch for while using this medicine? Tell your doctor if your symptoms do not get better or if they get worse. Visit your doctor or health care professional for regular checks on your progress. Because it may take several weeks to see the full effects of this medicine, it is important to continue your treatment as prescribed by your doctor. Patients and their families should watch out for new or worsening thoughts of suicide or depression. Also watch out for sudden changes in feelings such as feeling anxious, agitated, panicky, irritable, hostile, aggressive, impulsive, severely restless, overly excited and hyperactive, or not being able to sleep. If this happens, especially at the beginning of treatment or after a change in dose, call your health care professional. Dennis Bast may get drowsy or dizzy. Do not drive, use machinery, or do anything that needs mental alertness until you know how this medicine affects you. Do not stand or sit up quickly, especially if you are an older patient. This reduces the risk of dizzy or fainting spells. Alcohol may interfere with the effect of this medicine. Avoid alcoholic drinks. Your mouth may get dry. Chewing sugarless gum or sucking hard candy, and drinking plenty of water will help. Contact  your doctor if the problem does not go away or is severe. What side effects may I notice from receiving this medicine? Side effects that you should report to your doctor or health care professional as soon as possible:  allergic reactions like skin rash, itching or hives, swelling of the face, lips, or tongue  anxious  black, tarry stools  breathing problems  changes in vision  chest pain  confusion  elevated mood, decreased need for sleep, racing thoughts, impulsive behavior  eye pain  fast, irregular heartbeat  feeling faint or lightheaded, falls  feeling agitated, angry, or irritable  hallucination, loss of contact with reality  loss of balance or coordination  loss of memory  painful or prolonged erections  restlessness, pacing, inability to keep still  seizures  stiff muscles  suicidal thoughts or other mood changes  trouble sleeping  unusual bleeding or bruising  unusually weak or tired  vomiting Side effects that usually do not require medical attention (report to your doctor or health care professional if they continue or are bothersome):  change in appetite or weight  change in sex drive or performance  dizziness  headache  increased sweating  indigestion, nausea  tremors This list may not describe all possible side effects. Call your doctor for medical advice about side effects. You may report side effects to FDA at 1-800-FDA-1088. Where should I keep my medicine? Keep out of reach of children. Store at room temperature  between 15 and 30 degrees C (59 and 86 degrees F). Throw away any unused medicine after the expiration date. NOTE: This sheet is a summary. It may not cover all possible information. If you have questions about this medicine, talk to your doctor, pharmacist, or health care provider.  2020 Elsevier/Gold Standard (2018-03-03 09:05:36)

## 2019-11-26 LAB — CBC
Hematocrit: 31 % — ABNORMAL LOW (ref 34.0–46.6)
Hemoglobin: 9.5 g/dL — ABNORMAL LOW (ref 11.1–15.9)
MCH: 26.6 pg (ref 26.6–33.0)
MCHC: 30.6 g/dL — ABNORMAL LOW (ref 31.5–35.7)
MCV: 87 fL (ref 79–97)
Platelets: 317 10*3/uL (ref 150–450)
RBC: 3.57 x10E6/uL — ABNORMAL LOW (ref 3.77–5.28)
RDW: 17.4 % — ABNORMAL HIGH (ref 11.7–15.4)
WBC: 10.1 10*3/uL (ref 3.4–10.8)

## 2019-11-26 LAB — BASIC METABOLIC PANEL
BUN/Creatinine Ratio: 16 (ref 9–23)
BUN: 9 mg/dL (ref 6–20)
CO2: 23 mmol/L (ref 20–29)
Calcium: 8.5 mg/dL — ABNORMAL LOW (ref 8.7–10.2)
Chloride: 103 mmol/L (ref 96–106)
Creatinine, Ser: 0.57 mg/dL (ref 0.57–1.00)
GFR calc Af Amer: 138 mL/min/{1.73_m2} (ref >59)
GFR calc non Af Amer: 120 mL/min/{1.73_m2} (ref >59)
Glucose: 81 mg/dL (ref 65–99)
Potassium: 4 mmol/L (ref 3.5–5.2)
Sodium: 138 mmol/L (ref 134–144)

## 2019-11-26 LAB — VITAMIN B12: Vitamin B-12: 217 pg/mL — ABNORMAL LOW (ref 232–1245)

## 2019-11-26 LAB — TSH: TSH: 1.46 u[IU]/mL (ref 0.450–4.500)

## 2019-11-28 DIAGNOSIS — R000 Tachycardia, unspecified: Secondary | ICD-10-CM | POA: Insufficient documentation

## 2019-11-28 DIAGNOSIS — M67472 Ganglion, left ankle and foot: Secondary | ICD-10-CM | POA: Insufficient documentation

## 2019-11-28 NOTE — Assessment & Plan Note (Signed)
Patient has history of anemia and pica in the form of starch eating.  She has not been taking her iron pills due to gastrointestinal intolerance.  We will fill out the forms for IV Feraheme due to her intolerance.  Will get CBC to follow-up on anemia as well as B12 given her poor diet and starch which may be blocking absorption of nutrients.

## 2019-11-28 NOTE — Assessment & Plan Note (Signed)
Patient appears anxious.  Endorsed general sensation of anxiety on most days.  GAD-7 was positive.  Not currently taking any medication.  Prescribed 10 mg Celexa for patient as this has shown benefit in GAD.  Follow-up in 1 month.

## 2019-11-28 NOTE — Assessment & Plan Note (Signed)
Based on patient's description sounds like PVCs with possible anxiety component.  EKG performed in the office was negative.  We will send in an order for 48-hour Holter monitor.  We will get TSH and CBC to look for other causes of palpitations.  We will follow up with patient after results of Holter monitor.

## 2019-11-28 NOTE — Assessment & Plan Note (Signed)
Currently not bothering patient.  She does not want to have it treated at this time.  She is aware that if it becomes bothersome she can always reschedule another appointment with [place].

## 2019-12-02 ENCOUNTER — Encounter: Payer: Self-pay | Admitting: Family Medicine

## 2019-12-03 MED ORDER — FERUMOXYTOL INJECTION 510 MG/17 ML: 510.0000 mg | Freq: Once | INTRAVENOUS | 0 refills | Status: DC

## 2019-12-03 NOTE — Addendum Note (Signed)
Addended by: Sandre Kitty on: 12/03/2019 10:07 AM   Modules accepted: Orders

## 2019-12-09 ENCOUNTER — Ambulatory Visit (INDEPENDENT_AMBULATORY_CARE_PROVIDER_SITE_OTHER): Payer: Medicaid Other

## 2019-12-09 DIAGNOSIS — R002 Palpitations: Secondary | ICD-10-CM

## 2019-12-18 ENCOUNTER — Other Ambulatory Visit (HOSPITAL_COMMUNITY): Payer: Self-pay

## 2019-12-21 ENCOUNTER — Ambulatory Visit (HOSPITAL_COMMUNITY)
Admission: RE | Admit: 2019-12-21 | Discharge: 2019-12-21 | Disposition: A | Discharge status: Home / Self Care | Payer: Medicaid Other | Source: Ambulatory Visit | Attending: Family Medicine | Admitting: Family Medicine

## 2019-12-21 ENCOUNTER — Other Ambulatory Visit: Payer: Self-pay

## 2019-12-21 DIAGNOSIS — D509 Iron deficiency anemia, unspecified: Secondary | ICD-10-CM | POA: Insufficient documentation

## 2019-12-21 MED ORDER — SODIUM CHLORIDE 0.9 % IV SOLN
510.0000 mg | Freq: Once | INTRAVENOUS | Status: AC
  Administered 2019-12-21: 510 mg via INTRAVENOUS
  Filled 2019-12-21: qty 510

## 2019-12-23 ENCOUNTER — Other Ambulatory Visit: Payer: Self-pay | Admitting: Family Medicine

## 2019-12-23 DIAGNOSIS — R002 Palpitations: Secondary | ICD-10-CM

## 2019-12-24 ENCOUNTER — Telehealth: Payer: Self-pay | Admitting: Family Medicine

## 2019-12-24 NOTE — Telephone Encounter (Signed)
Contacted pt and appt scheduled. Veda Arrellano Zimmerman Rumple, CMA

## 2019-12-24 NOTE — Telephone Encounter (Signed)
Can we call this pt to make sure she has a follow up appointment?  I want to discuss her antidepressant medication and Holter monitor results. She can make an appt with either me or her pcp.  If the patient asks about her holter monitor results let her know she had tachycardia but nothing else concerning.

## 2020-01-01 ENCOUNTER — Other Ambulatory Visit: Payer: Self-pay

## 2020-01-01 MED ORDER — FERROUS SULFATE 324 (65 FE) MG PO TBEC
1.0000 | DELAYED_RELEASE_TABLET | Freq: Every day | ORAL | 0 refills | Status: DC
Start: 2020-01-01 — End: 2020-04-04

## 2020-01-05 ENCOUNTER — Ambulatory Visit: Payer: Medicaid Other | Admitting: Family Medicine

## 2020-01-11 ENCOUNTER — Ambulatory Visit: Payer: Medicaid Other | Admitting: Family Medicine

## 2020-01-25 ENCOUNTER — Other Ambulatory Visit: Payer: Self-pay

## 2020-01-25 ENCOUNTER — Ambulatory Visit: Payer: Medicaid Other | Admitting: Family Medicine

## 2020-01-25 ENCOUNTER — Encounter: Payer: Self-pay | Admitting: Family Medicine

## 2020-01-25 ENCOUNTER — Ambulatory Visit (INDEPENDENT_AMBULATORY_CARE_PROVIDER_SITE_OTHER): Payer: Medicaid Other | Admitting: Family Medicine

## 2020-01-25 VITALS — BP 110/60 | HR 94 | Ht 61.0 in | Wt 184.0 lb

## 2020-01-25 DIAGNOSIS — M67472 Ganglion, left ankle and foot: Secondary | ICD-10-CM | POA: Diagnosis not present

## 2020-01-25 DIAGNOSIS — F418 Other specified anxiety disorders: Secondary | ICD-10-CM | POA: Diagnosis not present

## 2020-01-25 DIAGNOSIS — R000 Tachycardia, unspecified: Secondary | ICD-10-CM | POA: Diagnosis not present

## 2020-01-25 DIAGNOSIS — D509 Iron deficiency anemia, unspecified: Secondary | ICD-10-CM

## 2020-01-25 MED ORDER — CITALOPRAM HYDROBROMIDE 20 MG PO TABS
20.0000 mg | ORAL_TABLET | Freq: Every day | ORAL | 2 refills | Status: DC
Start: 2020-01-25 — End: 2020-06-18

## 2020-01-25 NOTE — Progress Notes (Signed)
    SUBJECTIVE:   CHIEF COMPLAINT / HPI:   Tachycardia: Patient still feels her heart beating fast.  States her mother has the same issue with fast heart rate.  Patient has not had any syncopal episodes.  Discussed with patient her normal Holter monitor results.  Anemia: Patient states that ever since she got the Feraheme injection she feels much better.  She is no longer desiring to eat starch.  She states last time she touched it it tasted terrible.  She is not currently taking iron but will.  She states she is allergic to sulfa medications and wants to know if that includes ferrous sulfate.  anxiety: Patient states that the Celexa 10 mg is working Adult nurse.  She states it calms her down to the point where she can think clearly.  She still states that she feels "jittery" a lot of times.  She does not have any side effects from taking his medication and is willing to increase the dose.  On her last visit the patient discussed a cyst that had arisen on her foot.  At the time we did not do anything for it.  Patient is now desiring a referral to podiatry to see if they can remove it.   PERTINENT  PMH / PSH: Anxiety, anemia, tachycardia  OBJECTIVE:   BP 110/60   Pulse 94   Ht 5\' 1"  (1.549 m)   Wt 184 lb (83.5 kg)   LMP 01/18/2020   SpO2 100%   BMI 34.77 kg/m   General: Alert and oriented.  No acute distress CV: Tachycardic rate, regular rhythm.  No murmurs Pulmonary: Lungs clear to auscultation bilaterally. Psych: Pleasant affect.  Smiling.  ASSESSMENT/PLAN:   Anemia Patient feels much better after her Feraheme injection.  No longer endorsing desire to eat starch.  Advised patient to take ferrous gluconate as tolerated and recommended B12 5000 mcg supplementation. -Repeat CBC  Depression with anxiety Patient states she is much less anxious well taking the citalopram, although is not completely resolved her anxiety.  No side effects.  Willing to increase dose -Increase dose to  20 mg Celexa daily  Tachycardia Holter monitor revealed sinus tachycardia.  Patient states mom also diagnosed with tachycardia previously.  Average rate was 101 on Holter monitor.  No ectopic beats detected.  We will continue to monitor, but no need for pharmacological intervention at this time or referral to specialist.  Ganglion cyst of left foot Patient desires referral to podiatry for possible removal. -Ambulatory referral to podiatry     01/20/2020, MD [facility]

## 2020-01-25 NOTE — Patient Instructions (Addendum)
It was nice to see you again today,  For your anemia, I would continue to take iron supplement.  You can get this over-the-counter.  325 mg of ferrous sulfate is what you should be looking for.  You can take it daily, but if this is causing gastrointestinal discomfort, you can space it out to every other day or every 3 days.  You should also take a B12 supplement.  This is also available over-the-counter at any pharmacy or grocery store.  Please look for the ones that are released 1000 mcg.  Try to find one that is 5000 mcg.  Take 1 tablet daily.  We can recheck this at a future visit.  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  As long as it is in the low 100s and not causing you symptoms we do not need to do any further work-up or treatment.  For your anxiety I have increased your Celexa to 20 mg.  Try taking this for least 4 weeks if you can tolerate it.  If you are feeling like he cannot tolerate the medication increase, you can always let us know and we can go back to 10 mg.  Have a great day,  Frederic Jericho, MD

## 2020-01-25 NOTE — Assessment & Plan Note (Signed)
Patient desires referral to podiatry for possible removal. -Ambulatory referral to podiatry

## 2020-01-25 NOTE — Assessment & Plan Note (Signed)
Holter monitor revealed sinus tachycardia.  Patient states mom also diagnosed with tachycardia previously.  Average rate was 101 on Holter monitor.  No ectopic beats detected.  We will continue to monitor, but no need for pharmacological intervention at this time or referral to specialist.

## 2020-01-25 NOTE — Assessment & Plan Note (Signed)
Patient states she is much less anxious well taking the citalopram, although is not completely resolved her anxiety.  No side effects.  Willing to increase dose -Increase dose to 20 mg Celexa daily

## 2020-01-25 NOTE — Assessment & Plan Note (Signed)
Patient feels much better after her Feraheme injection.  No longer endorsing desire to eat starch.  Advised patient to take ferrous gluconate as tolerated and recommended B12 5000 mcg supplementation. -Repeat CBC

## 2020-01-26 LAB — CBC
Hematocrit: 36.4 % (ref 34.0–46.6)
Hemoglobin: 11.6 g/dL (ref 11.1–15.9)
MCH: 29.5 pg (ref 26.6–33.0)
MCHC: 31.9 g/dL (ref 31.5–35.7)
MCV: 93 fL (ref 79–97)
Platelets: 252 10*3/uL (ref 150–450)
RBC: 3.93 x10E6/uL (ref 3.77–5.28)
RDW: 20 % — ABNORMAL HIGH (ref 11.7–15.4)
WBC: 10.8 10*3/uL (ref 3.4–10.8)

## 2020-02-03 ENCOUNTER — Other Ambulatory Visit: Payer: Self-pay | Admitting: Family Medicine

## 2020-04-02 ENCOUNTER — Other Ambulatory Visit: Payer: Self-pay | Admitting: Family Medicine

## 2020-06-18 ENCOUNTER — Ambulatory Visit
Admission: EM | Admit: 2020-06-18 | Discharge: 2020-06-18 | Disposition: A | Discharge status: Home / Self Care | Payer: Medicaid Other | Attending: Internal Medicine | Admitting: Internal Medicine

## 2020-06-18 ENCOUNTER — Encounter: Payer: Self-pay | Admitting: Emergency Medicine

## 2020-06-18 ENCOUNTER — Other Ambulatory Visit: Payer: Self-pay

## 2020-06-18 DIAGNOSIS — F411 Generalized anxiety disorder: Secondary | ICD-10-CM | POA: Diagnosis not present

## 2020-06-18 MED ORDER — HYDROXYZINE HCL 25 MG PO TABS: 25.0000 mg | ORAL_TABLET | Freq: Three times a day (TID) | ORAL | 0 refills | Status: DC | PRN

## 2020-06-18 MED ORDER — ESCITALOPRAM OXALATE 10 MG PO TABS: 10.0000 mg | ORAL_TABLET | Freq: Every day | ORAL | 1 refills | Status: DC

## 2020-06-18 MED ORDER — HYDROXYZINE HCL 25 MG PO TABS: 25.0000 mg | ORAL_TABLET | Freq: Four times a day (QID) | ORAL | 0 refills | Status: DC

## 2020-06-18 NOTE — ED Triage Notes (Signed)
Pt said she was in the car line waiting on son and felt like she was having fast heart rate and anxious. Pt said she jumped out of car and just felt over whelmed. Pt said she is feeling it more often. Pt said hx of anxiety and depression.No chest pain ,just fast rate.

## 2020-06-18 NOTE — Discharge Instructions (Signed)
Please take medications as prescribed We will call you if your lab results are abnormal Return to urgent care if you have worsening symptoms.

## 2020-06-20 NOTE — ED Provider Notes (Signed)
[facility]    CSN: [id number] Arrival date & time: 06/18/20  1418      History   Chief Complaint Chief Complaint  Patient presents with  . Irregular Heart Beat    HPI Kerry Chavez is a 35 y.o. female comes to the urgent care with complaints of intermittent palpitations with increased anxiety.  Patient symptoms have been worsening over the past few weeks.  Onset is usually sudden, associated with anxiety and a feeling of impending doom.  She usually has to calm itself down for the symptoms to pass.  She has a history of anxiety-currently not on medications.  Recently she has been under a lot of social stress.  She does not sleep well at night, worries a lot about her family and feels depressed.  She denies any homicidal or suicidal ideation.  Patient symptoms have been worsening over the past year after she lost her father.  She was very close with her father.  She is currently the main breadwinner taking care of her children and her mother.  No chest pain or chest pressure.  No syncope or near syncopal episodes.Marland Kitchen   HPI  Past Medical History:  Diagnosis Date  . Asthma   . Depression     Patient Active Problem List   Diagnosis Date Noted  . Tachycardia 11/28/2019  . Ganglion cyst of left foot 11/28/2019  . Anemia 05/07/2019  . Pica in adults 05/07/2019  . Vaginal odor 02/02/2019  . Screen for STD (sexually transmitted disease) 04/25/2018  . Screening for malignant neoplasm of cervix 04/25/2018  . TOBACCO USER 12/04/2008  . OBESITY, UNSPECIFIED 11/10/2007  . Depression with anxiety 12/18/2006    Past Surgical History:  Procedure Laterality Date  . CESAREAN SECTION    . CESAREAN SECTION WITH BILATERAL TUBAL LIGATION Bilateral 06/12/2014   Procedure: Repeat CESAREAN SECTION WITH BILATERAL TUBAL LIGATION;  Surgeon: Maxie Better, MD;  Location: [facility];  Service: Obstetrics;  Laterality: Bilateral;  EDD: 06/19/14 Wants clear C-Section drape for this case  .  TONSILLECTOMY AND ADENOIDECTOMY  1998    OB History    Gravida  4   Para  2   Term  2   Preterm      AB      Living  2     SAB      IAB      Ectopic      Multiple  0   Live Births  2            Home Medications    Prior to Admission medications   Medication Sig Start Date End Date Taking? Authorizing Provider  escitalopram (LEXAPRO) 10 MG tablet Take 1 tablet (10 mg total) by mouth daily. 06/18/20  Yes Blayke Pinera, Britta Mccreedy, MD  acetaminophen (TYLENOL) 500 MG tablet Take 1 tablet (500 mg total) by mouth every 6 (six) hours as needed. 11/30/14   Mady Gemma, PA-C  albuterol (PROVENTIL HFA;VENTOLIN HFA) 108 (90 BASE) MCG/ACT inhaler Inhale 2 puffs into the lungs every 4 (four) hours as needed for wheezing or shortness of breath. Pt states uses seasonally during winter.    [provider]  ferrous sulfate 324 (65 Fe) MG TBEC TAKE 1 TABLET BY MOUTH EVERY DAY 04/04/20   Melene Plan, MD  ferumoxytol [facility]) 510 MG/17ML SOLN injection Inject 17 mLs (510 mg total) into the vein once for 1 dose. 12/03/19 12/03/19  Sandre Kitty, MD  fluconazole (DIFLUCAN) 150 MG tablet  Take 1 tablet (150 mg total) by mouth daily. Take second pill 72 hours after the first pill. 04/25/18   Lennox Solders, MD  hydrOXYzine (ATARAX/VISTARIL) 25 MG tablet Take 1 tablet (25 mg total) by mouth every 8 (eight) hours as needed for anxiety. 06/18/20   Daylene Vandenbosch, Britta Mccreedy, MD  metroNIDAZOLE (FLAGYL) 500 MG tablet Take 1 tablet (500 mg total) by mouth 2 (two) times daily. 05/06/19   Myrene Buddy, MD  citalopram (CELEXA) 20 MG tablet Take 1 tablet (20 mg total) by mouth daily. 01/25/20 06/18/20  Sandre Kitty, MD    Family History Family History  Problem Relation Age of Onset  . Diabetes Mother     Social History Social History   Tobacco Use  . Smoking status: Current Every Day Smoker    Packs/day: 0.25    Years: 10.00    Pack years: 2.50    Types: Cigarettes  . Smokeless  tobacco: Never Used  Substance Use Topics  . Alcohol use: Yes    Comment: not while pregnant  . Drug use: No     Allergies   Shellfish allergy, Ivp dye [iodinated diagnostic agents], Penicillins, Sulfamethoxazole, and Latex   Review of Systems Review of Systems  Constitutional: Negative.   HENT: Negative.   Respiratory: Negative.  Negative for cough and shortness of breath.   Cardiovascular: Positive for palpitations.  Gastrointestinal: Negative.   Musculoskeletal: Negative.   Neurological: Negative.   Psychiatric/Behavioral: Positive for dysphoric mood and sleep disturbance. Negative for agitation, confusion and suicidal ideas. The patient is nervous/anxious. The patient is not hyperactive.      Physical Exam Triage Vital Signs ED Triage Vitals  Enc Vitals Group     BP 06/18/20 1431 103/76     Pulse Rate 06/18/20 1431 84     Resp 06/18/20 1431 20     Temp 06/18/20 1431 98.4 F (36.9 C)     Temp Source 06/18/20 1431 Oral     SpO2 06/18/20 1431 100 %     Weight --      Height --      Head Circumference --      Peak Flow --      Pain Score 06/18/20 1435 0     Pain Loc --      Pain Edu? --      Excl. in GC? --    No data found.  Updated Vital Signs BP 103/76 (BP Location: Left Arm)   Pulse 84   Temp 98.4 F (36.9 C) (Oral)   Resp 20   LMP 05/24/2020   SpO2 100%   Visual Acuity Right Eye Distance:   Left Eye Distance:   Bilateral Distance:    Right Eye Near:   Left Eye Near:    Bilateral Near:     Physical Exam Vitals and nursing note reviewed.  Constitutional:      General: She is not in acute distress.    Appearance: She is not ill-appearing.  Cardiovascular:     Rate and Rhythm: Normal rate and regular rhythm.     Pulses: Normal pulses.     Heart sounds: Normal heart sounds.  Pulmonary:     Effort: Pulmonary effort is normal.     Breath sounds: Normal breath sounds.  Abdominal:     General: Bowel sounds are normal.     Palpations: Abdomen  is soft.  Musculoskeletal:        General: Normal range of motion.  Skin:  General: Skin is warm.  Neurological:     Mental Status: She is alert.      UC Treatments / Results  Labs (all labs ordered are listed, but only abnormal results are displayed) Labs Reviewed  VITAMIN D 25 HYDROXY (VIT D DEFICIENCY, FRACTURES)    EKG   Radiology No results found.  Procedures Procedures (including critical care time)  Medications Ordered in UC Medications - No data to display  Initial Impression / Assessment and Plan / UC Course  I have reviewed the triage vital signs and the nursing notes.  Pertinent labs & imaging results that were available during my care of the patient were reviewed by me and considered in my medical decision making (see chart for details).     1.  Generalized anxiety disorder: Patient has been having panic attacks more frequently recently. Hydroxyzine as needed for anxiety/panic attacks Start patient on Lexapro 10 mg orally daily Recent TSH, CBC CMP were unremarkable. Vitamin D If patient symptoms persist she will benefit from seeing a psychotherapist or psychiatrist. Return precautions given  Final Clinical Impressions(s) / UC Diagnoses   Final diagnoses:  Generalized anxiety disorder     Discharge Instructions     Please take medications as prescribed We will call you if your lab results are abnormal Return to urgent care if you have worsening symptoms.   ED Prescriptions    Medication Sig Dispense Auth. Provider   hydrOXYzine (ATARAX/VISTARIL) 25 MG tablet  (Status: Discontinued) Take 1 tablet (25 mg total) by mouth every 6 (six) hours. 30 tablet Vere Diantonio, Britta Mccreedy, MD   escitalopram (LEXAPRO) 10 MG tablet Take 1 tablet (10 mg total) by mouth daily. 30 tablet Emmie Frakes, Britta Mccreedy, MD   hydrOXYzine (ATARAX/VISTARIL) 25 MG tablet Take 1 tablet (25 mg total) by mouth every 8 (eight) hours as needed for anxiety. 30 tablet Tazia Illescas, Britta Mccreedy, MD      PDMP not reviewed this encounter.   Merrilee Jansky, MD 06/20/20 [phone]

## 2020-06-21 LAB — VITAMIN D 25 HYDROXY (VIT D DEFICIENCY, FRACTURES): Vit D, 25-Hydroxy: 9 ng/mL — ABNORMAL LOW (ref 30.0–100.0)

## 2020-06-22 ENCOUNTER — Other Ambulatory Visit: Payer: Self-pay

## 2020-06-22 ENCOUNTER — Encounter: Payer: Self-pay | Admitting: Family Medicine

## 2020-06-22 ENCOUNTER — Ambulatory Visit: Payer: Medicaid Other | Admitting: Family Medicine

## 2020-06-22 ENCOUNTER — Ambulatory Visit (INDEPENDENT_AMBULATORY_CARE_PROVIDER_SITE_OTHER): Payer: Medicaid Other

## 2020-06-22 VITALS — BP 126/70 | HR 86 | Ht 61.0 in | Wt 181.4 lb

## 2020-06-22 DIAGNOSIS — Z23 Encounter for immunization: Secondary | ICD-10-CM

## 2020-06-22 DIAGNOSIS — F418 Other specified anxiety disorders: Secondary | ICD-10-CM | POA: Diagnosis not present

## 2020-06-22 DIAGNOSIS — D509 Iron deficiency anemia, unspecified: Secondary | ICD-10-CM

## 2020-06-22 DIAGNOSIS — E559 Vitamin D deficiency, unspecified: Secondary | ICD-10-CM

## 2020-06-22 LAB — POCT HEMOGLOBIN: Hemoglobin: 11.9 g/dL (ref 11–14.6)

## 2020-06-22 MED ORDER — VITAMIN D (ERGOCALCIFEROL) 1.25 MG (50000 UNIT) PO CAPS: 50000.0000 [IU] | ORAL_CAPSULE | ORAL | 0 refills | Status: DC

## 2020-06-22 NOTE — Progress Notes (Signed)
    SUBJECTIVE:   CHIEF COMPLAINT / HPI:   Low iron? Taking B12?  Anemia: Patient wants to check to see if her iron is low again because she is feeling more fatigued lately.  Has not had any episodes of pica since her transfusion  Anxiety: Patient had an episode of feeling like she was going to "pass out" in the line to pick up her son at school.  This occurred on Friday.  This episode lasted 5 minutes.  It included chest pain, feeling short of breath.  She went to the urgent care afterwards.  She was prescribed 10 mg Lexapro daily and Atarax 25 mg to take as needed.  She has been taking both once a day.  She now gets similar feelings of anxiety when she is at the same spot where the first incident occurred.  She has a lot of stress going on in her life right now.  She is working 2 jobs and in addition to her own children is now raising the child of her friend who recently died.  Her mom is also is disabled.  Her uncle just died.  Patient was previously on a medication for depression but feels like this was not helping.  She has tried Celexa and Prozac before.  No history of seizure disorder.  No history of manic episodes.   PERTINENT  PMH / PSH: Depression  OBJECTIVE:   BP 126/70   Pulse 86   Ht 5\' 1"  (1.549 m)   Wt 181 lb 6.4 oz (82.3 kg)   LMP 06/22/2020   SpO2 99%   BMI 34.28 kg/m   General: Alert and oriented.  No acute distress CV: Regular rate and rhythm, no murmurs. Pulmonary: Lungs clear auscultation bilaterally Psych: Pleasant affect.  Makes eye contact.  Normal speech volume.  Normal thought content.  ASSESSMENT/PLAN:   Anemia Point-of-care hemoglobin was 11.9.  Fatigue was likely due to stress.  Currently working 2 jobs and taking care of her children in addition to her friend's child.  Vitamin D deficiency Vitamin D was checked at the urgent care.  Her level was 9.  Prescribed the patient 50,000 units q. weekly for 8 weeks.  Will reassess after 8 weeks.  Depression  with anxiety Patient appears to have had a panic episode.  She has known underlying depression with anxiety.  Has previously tried Celexa and Prozac.  Did not feel like that helped.  Was recently started on Lexapro by the urgent care and she feels like this has been helping.  I advised her to take the hydroxyzine as needed and not once daily like she has been doing.  Will reassess at next visit in approximately 3 weeks.  Discussed with patient different therapy options.  Showed her the psychology today.com website.  Patient expressed interest but was concerned she does not have time for therapy sessions given her busy schedule.     06/24/2020, MD [facility]

## 2020-06-22 NOTE — Patient Instructions (Addendum)
It was nice to see you today,  We discussed the following things today: -Continue taking your Lexapro once a day.  You can take it at night if you like it is making her drowsy. -The Atarax/Vistaril/hydroxyzine medication is to be used as needed.  You can take 1 pill every 8 hours as needed for anxiety attacks.  Eventually the Lexapro should help reduce the number of anxiety attacks. -Your vitamin D was low.  We will prescribe you a once a week medication to take for the next 8 weeks.  We will then recheck it at that time. -Your hemoglobin was 11.9 which is an improvement from the last time it was checked when it was 11.6.  I would like you to follow-up in 2 weeks.  Have a great day,  Frederic Jericho, MD

## 2020-06-23 DIAGNOSIS — E559 Vitamin D deficiency, unspecified: Secondary | ICD-10-CM | POA: Insufficient documentation

## 2020-06-23 NOTE — Assessment & Plan Note (Signed)
Point-of-care hemoglobin was 11.9.  Fatigue was likely due to stress.  Currently working 2 jobs and taking care of her children in addition to her friend's child.

## 2020-06-23 NOTE — Assessment & Plan Note (Signed)
Patient appears to have had a panic episode.  She has known underlying depression with anxiety.  Has previously tried Celexa and Prozac.  Did not feel like that helped.  Was recently started on Lexapro by the urgent care and she feels like this has been helping.  I advised her to take the hydroxyzine as needed and not once daily like she has been doing.  Will reassess at next visit in approximately 3 weeks.  Discussed with patient different therapy options.  Showed her the psychology today.com website.  Patient expressed interest but was concerned she does not have time for therapy sessions given her busy schedule.

## 2020-06-23 NOTE — Assessment & Plan Note (Signed)
Vitamin D was checked at the urgent care.  Her level was 9.  Prescribed the patient 50,000 units q. weekly for 8 weeks.  Will reassess after 8 weeks.

## 2020-10-01 ENCOUNTER — Ambulatory Visit
Admission: EM | Admit: 2020-10-01 | Discharge: 2020-10-01 | Disposition: A | Discharge status: Home / Self Care | Payer: Medicaid Other | Attending: Emergency Medicine | Admitting: Emergency Medicine

## 2020-10-01 ENCOUNTER — Encounter: Payer: Self-pay | Admitting: Emergency Medicine

## 2020-10-01 ENCOUNTER — Other Ambulatory Visit: Payer: Self-pay

## 2020-10-01 DIAGNOSIS — R002 Palpitations: Secondary | ICD-10-CM | POA: Diagnosis not present

## 2020-10-01 DIAGNOSIS — R42 Dizziness and giddiness: Secondary | ICD-10-CM

## 2020-10-01 DIAGNOSIS — E559 Vitamin D deficiency, unspecified: Secondary | ICD-10-CM

## 2020-10-01 MED ORDER — ESCITALOPRAM OXALATE 10 MG PO TABS: 10.0000 mg | ORAL_TABLET | Freq: Every day | ORAL | 0 refills | Status: DC

## 2020-10-01 NOTE — Discharge Instructions (Addendum)
EKG normal today Blood work pending Please contact cardiology for outpatient follow-up of palpitations-contact below Please restart taking Lexapro daily over the next 3 months and follow-up with primary care for recheck of anxiety/need for dosage change or switch to alternative medicine Continue to use hydroxyzine as needed Please return if any symptoms not improving or worsening

## 2020-10-01 NOTE — ED Provider Notes (Signed)
[facility]    CSN: [id number] Arrival date & time: 10/01/20  1500      History   Chief Complaint Chief Complaint  Patient presents with   Palpitations    HPI Kerry Chavez is a 35 y.o. female history of asthma, anxiety, tobacco use, presenting today for evaluation of irregular heartbeat.  Reports that over the past 4 days she has had intermittent spells of feeling her heart racing.  At that time she will also develop associated shortness of breath and feels very anxious.  Reports symptoms will last for approximately 10 minutes and subside.  Today had a spell where she started to feel very lightheaded as if she may pass out.  She does report symptoms in the past which have only lasted for approximately 1 minute, but worsening of recently.  She denies any chest pain or shortness of breath or sensation of palpitations at present.  She denies history of hypertension, diabetes.  Does report tobacco use.  She reports a lot of stress being a single mom, recently losing a parent and friend.   HPI  Past Medical History:  Diagnosis Date   Asthma    Depression     Patient Active Problem List   Diagnosis Date Noted   Vitamin D deficiency 06/23/2020   Tachycardia 11/28/2019   Ganglion cyst of left foot 11/28/2019   Anemia 05/07/2019   Pica in adults 05/07/2019   Vaginal odor 02/02/2019   Screen for STD (sexually transmitted disease) 04/25/2018   Screening for malignant neoplasm of cervix 04/25/2018   TOBACCO USER 12/04/2008   OBESITY, UNSPECIFIED 11/10/2007   Depression with anxiety 12/18/2006    Past Surgical History:  Procedure Laterality Date   CESAREAN SECTION     CESAREAN SECTION WITH BILATERAL TUBAL LIGATION Bilateral 06/12/2014   Procedure: Repeat CESAREAN SECTION WITH BILATERAL TUBAL LIGATION;  Surgeon: Maxie Better, MD;  Location: [facility];  Service: Obstetrics;  Laterality: Bilateral;  EDD: 06/19/14 Wants clear C-Section drape for this case   TONSILLECTOMY  AND ADENOIDECTOMY  1998    OB History     Gravida  4   Para  2   Term  2   Preterm      AB      Living  2      SAB      IAB      Ectopic      Multiple  0   Live Births  2            Home Medications    Prior to Admission medications   Medication Sig Start Date End Date Taking? Authorizing Provider  acetaminophen (TYLENOL) 500 MG tablet Take 1 tablet (500 mg total) by mouth every 6 (six) hours as needed. 11/30/14   Mady Gemma, PA-C  albuterol (PROVENTIL HFA;VENTOLIN HFA) 108 (90 BASE) MCG/ACT inhaler Inhale 2 puffs into the lungs every 4 (four) hours as needed for wheezing or shortness of breath. Pt states uses seasonally during winter.    [provider]  escitalopram (LEXAPRO) 10 MG tablet Take 1 tablet (10 mg total) by mouth daily. 10/01/20   Oluwatimilehin Balfour C, PA-C  ferrous sulfate 324 (65 Fe) MG TBEC TAKE 1 TABLET BY MOUTH EVERY DAY 04/04/20   Melene Plan, MD  ferumoxytol [facility]) 510 MG/17ML SOLN injection Inject 17 mLs (510 mg total) into the vein once for 1 dose. 12/03/19 12/03/19  Sandre Kitty, MD  hydrOXYzine (ATARAX/VISTARIL) 25 MG tablet Take 1  tablet (25 mg total) by mouth every 8 (eight) hours as needed for anxiety. 06/18/20   Lamptey, Britta Mccreedy, MD  Vitamin D, Ergocalciferol, (DRISDOL) 1.25 MG (50000 UNIT) CAPS capsule Take 1 capsule (50,000 Units total) by mouth every 7 (seven) days. 06/22/20   Sandre Kitty, MD  citalopram (CELEXA) 20 MG tablet Take 1 tablet (20 mg total) by mouth daily. 01/25/20 06/18/20  Sandre Kitty, MD    Family History Family History  Problem Relation Age of Onset   Diabetes Mother     Social History Social History   Tobacco Use   Smoking status: Every Day    Packs/day: 0.25    Years: 10.00    Pack years: 2.50    Types: Cigarettes   Smokeless tobacco: Never  Substance Use Topics   Alcohol use: Yes    Comment: not while pregnant   Drug use: No     Allergies   Shellfish allergy, Ivp dye  [iodinated diagnostic agents], Penicillins, Sulfamethoxazole, and Latex   Review of Systems Review of Systems  Constitutional:  Negative for fatigue and fever.  HENT:  Negative for congestion, sinus pressure and sore throat.   Eyes:  Negative for photophobia, pain and visual disturbance.  Respiratory:  Negative for cough and shortness of breath.   Cardiovascular:  Positive for palpitations. Negative for chest pain.  Gastrointestinal:  Negative for abdominal pain, nausea and vomiting.  Genitourinary:  Negative for decreased urine volume and hematuria.  Musculoskeletal:  Negative for myalgias, neck pain and neck stiffness.  Neurological:  Negative for dizziness, syncope, facial asymmetry, speech difficulty, weakness, light-headedness, numbness and headaches.    Physical Exam Triage Vital Signs ED Triage Vitals [10/01/20 1519]  Enc Vitals Group     BP 119/66     Pulse Rate 86     Resp 18     Temp 98.2 F (36.8 C)     Temp Source Oral     SpO2 99 %     Weight      Height      Head Circumference      Peak Flow      Pain Score 0     Pain Loc      Pain Edu?      Excl. in GC?    No data found.  Updated Vital Signs BP 119/66 (BP Location: Left Arm)   Pulse 86   Temp 98.2 F (36.8 C) (Oral)   Resp 18   SpO2 99%   Visual Acuity Right Eye Distance:   Left Eye Distance:   Bilateral Distance:    Right Eye Near:   Left Eye Near:    Bilateral Near:     Physical Exam Vitals and nursing note reviewed.  Constitutional:      Appearance: She is well-developed.     Comments: No acute distress  HENT:     Head: Normocephalic and atraumatic.     Nose: Nose normal.  Eyes:     Conjunctiva/sclera: Conjunctivae normal.  Cardiovascular:     Rate and Rhythm: Normal rate and regular rhythm.  Pulmonary:     Effort: Pulmonary effort is normal. No respiratory distress.     Comments: Breathing comfortably at rest, CTABL, no wheezing, rales or other adventitious sounds  auscultated  Abdominal:     General: There is no distension.  Musculoskeletal:        General: Normal range of motion.     Cervical back: Neck supple.  Skin:  General: Skin is warm and dry.  Neurological:     General: No focal deficit present.     Mental Status: She is alert and oriented to person, place, and time. Mental status is at baseline.     Cranial Nerves: No cranial nerve deficit.     Motor: No weakness.     UC Treatments / Results  Labs (all labs ordered are listed, but only abnormal results are displayed) Labs Reviewed  CBC WITH DIFFERENTIAL/PLATELET  BASIC METABOLIC PANEL  TSH  VITAMIN D 25 HYDROXY (VIT D DEFICIENCY, FRACTURES)    EKG   Radiology No results found.  Procedures Procedures (including critical care time)  Medications Ordered in UC Medications - No data to display  Initial Impression / Assessment and Plan / UC Course  I have reviewed the triage vital signs and the nursing notes.  Pertinent labs & imaging results that were available during my care of the patient were reviewed by me and considered in my medical decision making (see chart for details).     EKG normal sinus rhythm, no acute signs of ischemia or infarction.  Checking blood work of CBC, BMP and TSH with vitamin D.  We will call with results and provide further treatment as needed.  Discussed solution care with PCP for follow-up of anxiety as well as persistent symptoms.  Given episodic palpitations discussed outpatient follow-up with cardiology.  Low suspicion of ACS at this time.  Refilling Lexapro and discussed using this consistently for prevention rather than as needed.  Continue hydroxyzine as needed.  Discussed strict return precautions. Patient verbalized understanding and is agreeable with plan.  Final Clinical Impressions(s) / UC Diagnoses   Final diagnoses:  Vitamin D deficiency  Palpitations  Lightheadedness     Discharge Instructions      EKG normal  today Blood work pending Please contact cardiology for outpatient follow-up of palpitations-contact below Please restart taking Lexapro daily over the next 3 months and follow-up with primary care for recheck of anxiety/need for dosage change or switch to alternative medicine Continue to use hydroxyzine as needed Please return if any symptoms not improving or worsening     ED Prescriptions     Medication Sig Dispense Auth. Provider   escitalopram (LEXAPRO) 10 MG tablet Take 1 tablet (10 mg total) by mouth daily. 90 tablet Kenly Xiao, Bassett C, PA-C      PDMP not reviewed this encounter.   Lew Dawes, [place] 10/03/20 [phone]

## 2020-10-01 NOTE — ED Triage Notes (Signed)
Pt here for palpitations and feeling not well x 4 days; pt sts hx of anxiety and unsure if is cause; pt sts has taken meds without relief

## 2020-10-04 LAB — BASIC METABOLIC PANEL
BUN/Creatinine Ratio: 13 (ref 9–23)
BUN: 9 mg/dL (ref 6–20)
CO2: 17 mmol/L — ABNORMAL LOW (ref 20–29)
Calcium: 9.1 mg/dL (ref 8.7–10.2)
Chloride: 107 mmol/L — ABNORMAL HIGH (ref 96–106)
Creatinine, Ser: 0.68 mg/dL (ref 0.57–1.00)
Glucose: 78 mg/dL (ref 65–99)
Potassium: 4.4 mmol/L (ref 3.5–5.2)
Sodium: 140 mmol/L (ref 134–144)
eGFR: 116 mL/min/{1.73_m2} (ref >59)

## 2020-10-04 LAB — CBC WITH DIFFERENTIAL/PLATELET
Basophils Absolute: 0.1 10*3/uL (ref 0.0–0.2)
Basos: 1 %
EOS (ABSOLUTE): 0.1 10*3/uL (ref 0.0–0.4)
Eos: 1 %
Hematocrit: 35.8 % (ref 34.0–46.6)
Hemoglobin: 11.4 g/dL (ref 11.1–15.9)
Immature Grans (Abs): 0 10*3/uL (ref 0.0–0.1)
Immature Granulocytes: 0 %
Lymphocytes Absolute: 1.7 10*3/uL (ref 0.7–3.1)
Lymphs: 16 %
MCH: 30.5 pg (ref 26.6–33.0)
MCHC: 31.8 g/dL (ref 31.5–35.7)
MCV: 96 fL (ref 79–97)
Monocytes Absolute: 0.6 10*3/uL (ref 0.1–0.9)
Monocytes: 5 %
Neutrophils Absolute: 8.5 10*3/uL — ABNORMAL HIGH (ref 1.4–7.0)
Neutrophils: 77 %
Platelets: 276 10*3/uL (ref 150–450)
RBC: 3.74 x10E6/uL — ABNORMAL LOW (ref 3.77–5.28)
RDW: 15.7 % — ABNORMAL HIGH (ref 11.7–15.4)
WBC: 11 10*3/uL — ABNORMAL HIGH (ref 3.4–10.8)

## 2020-10-04 LAB — TSH: TSH: 0.452 u[IU]/mL (ref 0.450–4.500)

## 2020-10-04 LAB — VITAMIN D 25 HYDROXY (VIT D DEFICIENCY, FRACTURES): Vit D, 25-Hydroxy: 30.1 ng/mL (ref 30.0–100.0)

## 2020-10-10 ENCOUNTER — Ambulatory Visit (INDEPENDENT_AMBULATORY_CARE_PROVIDER_SITE_OTHER): Payer: BC Managed Care – PPO | Admitting: Family Medicine

## 2020-10-10 ENCOUNTER — Other Ambulatory Visit: Payer: Self-pay

## 2020-10-10 ENCOUNTER — Other Ambulatory Visit (HOSPITAL_COMMUNITY)
Admission: RE | Admit: 2020-10-10 | Discharge: 2020-10-10 | Disposition: A | Discharge status: Home / Self Care | Payer: BC Managed Care – PPO | Source: Ambulatory Visit | Attending: Family Medicine | Admitting: Family Medicine

## 2020-10-10 VITALS — BP 98/62 | HR 73 | Ht 61.0 in | Wt 180.6 lb

## 2020-10-10 DIAGNOSIS — F418 Other specified anxiety disorders: Secondary | ICD-10-CM | POA: Diagnosis not present

## 2020-10-10 DIAGNOSIS — Z113 Encounter for screening for infections with a predominantly sexual mode of transmission: Secondary | ICD-10-CM

## 2020-10-10 MED ORDER — ESCITALOPRAM OXALATE 10 MG PO TABS: 20.0000 mg | ORAL_TABLET | Freq: Every day | ORAL | 0 refills | Status: DC

## 2020-10-10 NOTE — Patient Instructions (Signed)
It was great seeing you today!  We increased your Lexapro from 10mg  to 20mg  daily. I definitely recommend medication in conjunction with therapy for the best results in decreasing your anxiety.   We are also checking for STIs. I will call you with any abnormal results.   Please check-out at the front desk before leaving the clinic. Please schedule to see your PCP to check on your anxiety and depression and how the increase in medication is working for you, but if you need to be seen earlier than that for any new issues we're happy to fit you in, just give a call!  Visit Reminders: - Stop by the pharmacy to pick up your prescriptions   Feel free to call with any questions or concerns at any time, at [phone].   Take care,  Dr. [place] [facility]

## 2020-10-10 NOTE — Progress Notes (Signed)
    SUBJECTIVE:   CHIEF COMPLAINT / HPI:   Ms. Kerry Chavez is a 35 yo who presents to [facility] for anxiety.  Went to ED about a week ago for SOB, palpitations and reports feeling like she was about to die for 3 times a day for the past 4 days with her SOB and feeling like her heart is racing. Anxiety worsens around people, hard to go to daughters school functions. Feels constantly shaky and jittery. Worried about losing mom, daughter going to college, worrying about finances. Currently taking Lexapro 10mg  and feels like she can think clearer but still feels very anxious. Was given number for therapist but does not currently see anybody. Has tried doing different calming techniques which help.  Toward end of visit patient requests STI testing due to sexual intercourse with a new partner recently.  Denies partner having any known STIs.  Denies any vaginal discharge or symptoms.  She is wanting a urine instead of a pelvic exam today.  OBJECTIVE:   BP 98/62   Pulse 73   Ht 5\' 1"  (1.549 m)   Wt 180 lb 9.6 oz (81.9 kg)   LMP 10/05/2020 (Approximate)   SpO2 99%   BMI 34.12 kg/m    General, alert, pleasant, NAD CV: RRR no murmurs  Resp: CTAB normal WOB  Psych: normal mood and behavior. Thought content and judgement normal   ASSESSMENT/PLAN:   No problem-specific Assessment & Plan notes found for this encounter.   Anxiety and Depression.  PHQ9 Patient reports increased anxiety for the past 4 days, with shortness of breath and palpitations.  Feels some improvement from her Lexapro.  Denies going to therapy.  Discussed importance of medication and therapy together for the best results.  For now we will increase her Lexapro from 10 to 20 mg daily.  STI screen Recent intercourse with a new partner. Asymptomatic.  Requesting a urine STI check - urine G/C  F/u with PCP in the next 2-4 weeks to check on anxiety   , DO [facility]

## 2020-10-11 LAB — URINE CYTOLOGY ANCILLARY ONLY
Chlamydia: NEGATIVE
Comment: NEGATIVE
Comment: NORMAL
Neisseria Gonorrhea: NEGATIVE

## 2020-11-29 NOTE — Progress Notes (Deleted)
     SUBJECTIVE:   CHIEF COMPLAINT / HPI:   Kerry Chavez is a 35 y.o. female presents for knot below collar bone  Knot   ***  Flowsheet Row Office Visit from 10/10/2020 in [facility]  PHQ-9 Total Score 18        Health Maintenance Due  Topic   Pneumococcal Vaccine 81-41 Years old (1 - PCV)   Hepatitis C Screening    INFLUENZA VACCINE       PERTINENT  PMH / PSH: depression, obesity   OBJECTIVE:   There were no vitals taken for this visit.   General: Alert, no acute distress Cardio: Normal S1 and S2, RRR, no r/m/g Pulm: CTAB, normal work of breathing Abdomen: Bowel sounds normal. Abdomen soft and non-tender.  Extremities: No peripheral edema.  Neuro: Cranial nerves grossly intact   ASSESSMENT/PLAN:   No problem-specific Assessment & Plan notes found for this encounter.    Towanda Octave, MD PGY-3 [facility]

## 2020-11-30 ENCOUNTER — Ambulatory Visit: Payer: Medicaid Other | Admitting: Family Medicine

## 2020-12-13 ENCOUNTER — Other Ambulatory Visit: Payer: Self-pay

## 2020-12-13 ENCOUNTER — Encounter: Payer: Self-pay | Admitting: Family Medicine

## 2020-12-13 ENCOUNTER — Ambulatory Visit: Payer: BC Managed Care – PPO | Admitting: Family Medicine

## 2020-12-13 VITALS — BP 107/68 | HR 96 | Ht 61.0 in | Wt 182.4 lb

## 2020-12-13 DIAGNOSIS — F419 Anxiety disorder, unspecified: Secondary | ICD-10-CM | POA: Insufficient documentation

## 2020-12-13 DIAGNOSIS — F411 Generalized anxiety disorder: Secondary | ICD-10-CM | POA: Diagnosis not present

## 2020-12-13 DIAGNOSIS — L729 Follicular cyst of the skin and subcutaneous tissue, unspecified: Secondary | ICD-10-CM | POA: Insufficient documentation

## 2020-12-13 DIAGNOSIS — Z23 Encounter for immunization: Secondary | ICD-10-CM | POA: Diagnosis not present

## 2020-12-13 DIAGNOSIS — R002 Palpitations: Secondary | ICD-10-CM | POA: Insufficient documentation

## 2020-12-13 DIAGNOSIS — D649 Anemia, unspecified: Secondary | ICD-10-CM

## 2020-12-13 MED ORDER — VENLAFAXINE HCL ER 37.5 MG PO CP24: 37.5000 mg | ORAL_CAPSULE | Freq: Every day | ORAL | 1 refills | Status: DC

## 2020-12-13 MED ORDER — FERROUS SULFATE 324 (65 FE) MG PO TBEC
1.0000 | DELAYED_RELEASE_TABLET | Freq: Every day | ORAL | 0 refills | Status: DC
Start: 2020-12-13 — End: 2021-04-30

## 2020-12-13 NOTE — Assessment & Plan Note (Signed)
Likely benign. Patient is very anxious about this - worried it might be cancer. Despite reassurance, she opted for further evaluation. Soft tissue US ordered and was scheduled. I will contact her soon with her result.

## 2020-12-13 NOTE — Progress Notes (Signed)
Soft    SUBJECTIVE:   CHIEF COMPLAINT / HPI:   Chest bump: She noticed a painful cyst on her chest about 3 weeks ago. The pain has since resolved. However, the cysts persist. No change in size. She is concerned this might be cancer. It is worsening her anxiety.   Anxiety:  C/O worsening anxiety. She did not tolerate Lexapro well. Hence, she self d/ced it a week ago. Hydroxyzine makes her drowsy. Her symptoms include palpitations and feeling overly anxious, which affects her daily activities. She has had issues with palpitations for years and all workups, including Holter monitoring for days, were normal. Denies chest pain or palpitations today.  Hx anemia: Need refill of iron pills.  PERTINENT  PMH / PSH: PMHx reviewed  OBJECTIVE:   Vitals:   12/13/20 0913  BP: 107/68  Pulse: 96  SpO2: 100%  Weight: 182 lb 6.4 oz (82.7 kg)  Height: 5\' 1"  (1.549 m)     Physical Exam Vitals and nursing note reviewed.  Cardiovascular:     Rate and Rhythm: Normal rate and regular rhythm.     Heart sounds: Normal heart sounds. No murmur heard. Pulmonary:     Effort: Pulmonary effort is normal. No respiratory distress.     Breath sounds: Normal breath sounds. No wheezing.  Chest:     Comments: Small <1 mm subcutaneous nodular on her left upper chest. Nodule if firm, mobile and non-tender. No erythema.    ASSESSMENT/PLAN:   Skin cyst Likely benign. Patient is very anxious about this - worried it might be cancer. Despite reassurance, she opted for further evaluation. Soft tissue ordered and was scheduled. I will contact her soon with her result.  Generalized anxiety disorder Chronic and recurrent. Failed Lexapro, Burspar and Hydroxyzine. Discussed Psychotherapy and is open to CCM referral. Effexor recommended and she agreed to trial this. CCM referral placed and I started her on Effexor 37.5 mg qd. F/U with PCP soon for reassessment. She agreed with the plan.     Palpitation Chronic and recurrent. Currently asymptomatic. Might be related to her anxiety. I reviewed her record - she had Holter monitoring in 2021 which showed tachycardia. TSH done a few months ago as well as EKG were reassuring. We will treat her anxiety and monitor for improvement. F/U soon or go to the ED if symptoms worsens. She agreed with the plan.  Anemia Most recent hemoglobin is normal. I refilled her iron supplement. I discussed OTC Miralax prn constipation as a complication of iron supplement. She agreed with the plan.     2022, MD [facility]

## 2020-12-13 NOTE — Patient Instructions (Signed)
Venlafaxine Tablets What is this medication? VENLAFAXINE (VEN la fax een) treats depression and anxiety. It increases the amount of serotonin and norepinephrine in the brain, hormones that helpregulate mood. It belongs to a group of medications called SNRIs. This medicine may be used for other purposes; ask your health care provider orpharmacist if you have questions. COMMON BRAND NAME(S): Effexor What should I tell my care team before I take this medication? They need to know if you have any of these conditions: Bleeding disorders Glaucoma Heart disease High blood pressure High cholesterol Kidney disease Liver disease Low levels of sodium in the blood Mania or bipolar disorder Seizures Suicidal thoughts, plans, or attempt; a previous suicide attempt by you or a family member Take medications that treat or prevent blood clots Thyroid disease An unusual or allergic reaction to venlafaxine, desvenlafaxine, other medications, foods, dyes, or preservatives Pregnant or trying to get pregnant Breast-feeding How should I use this medication? Take this medication by mouth with a glass of water. Follow the directions on the prescription label. Take it with food. Take your medication at regular intervals. Do not take your medication more often than directed. Do not stop taking this medication suddenly except upon the advice of your care team. Stopping this medication too quickly may cause serious side effects or yourcondition may worsen. A special MedGuide will be given to you by the pharmacist with eachprescription and refill. Be sure to read this information carefully each time. Talk to your care team regarding the use of this medication in children.Special care may be needed. Overdosage: If you think you have taken too much of this medicine contact apoison control center or emergency room at once. NOTE: This medicine is only for you. Do not share this medicine with others. What if I miss a  dose? If you miss a dose, take it as soon as you can. If it is almost time for yournext dose, take only that dose. Do not take double or extra doses. What may interact with this medication? Do not take this medication with any of the following: Certain medications for fungal infections like fluconazole, itraconazole, ketoconazole, posaconazole, voriconazole Cisapride Desvenlafaxine Dronedarone Duloxetine Levomilnacipran Linezolid MAOIs like Carbex, Eldepryl, Marplan, Nardil, and Parnate Methylene blue (injected into a vein) Milnacipran Pimozide Thioridazine This medication may also interact with the following: Amphetamines Aspirin and aspirin-like medications Certain medications for depression, anxiety, or psychotic disturbances Certain medications for migraine headaches like almotriptan, eletriptan, frovatriptan, naratriptan, rizatriptan, sumatriptan, zolmitriptan Certain medications for sleep Certain medications that treat or prevent blood clots like dalteparin, enoxaparin, warfarin Cimetidine Clozapine Diuretics Fentanyl Furazolidone Indinavir Isoniazid Lithium Metoprolol NSAIDS, medications for pain and inflammation, like ibuprofen or naproxen Other medications that prolong the QT interval (cause an abnormal heart rhythm) like dofetilide, ziprasidone Procarbazine Rasagiline Supplements like St. John's wort, kava kava, valerian Tramadol Tryptophan This list may not describe all possible interactions. Give your health care provider a list of all the medicines, herbs, non-prescription drugs, or dietary supplements you use. Also tell them if you smoke, drink alcohol, or use illegaldrugs. Some items may interact with your medicine. What should I watch for while using this medication? Tell your care team if your symptoms do not get better or if they get worse. Visit your care team for regular checks on your progress. Because it may take several weeks to see the full effects of  this medication, it is important tocontinue your treatment as prescribed by your care team. Watch for new or worsening thoughts   of suicide or depression. This includes sudden changes in mood, behaviors, or thoughts. These changes can happen at any time but are more common in the beginning of treatment or after a change in dose. Call your care team right away if you experience these thoughts orworsening depression. Manic episodes may happen in patients with bipolar disorder who take this medication. Watch for changes in feelings or behaviors such as feeling anxious, nervous, agitated, panicky, irritable, hostile, aggressive, impulsive, severely restless, overly excited and hyperactive, or trouble sleeping. These changes can happen at any time but are more common in the beginning of treatment or after a change in dose. Call your care team right away if you notice any ofthese symptoms. This medication can cause an increase in blood pressure. Check with your care team for instructions on monitoring your blood pressure while taking thismedication. You may get drowsy or dizzy. Do not drive, use machinery, or do anything that needs mental alertness until you know how this medication affects you. Do not stand or sit up quickly, especially if you are an older patient. This reduces the risk of dizzy or fainting spells. Alcohol may interfere with the effect ofthis medication. Avoid alcoholic drinks. Your mouth may get dry. Chewing sugarless gum, sucking hard candy and drinking plenty of water will help. Contact your care team, if the problem does not goaway or is severe. What side effects may I notice from receiving this medication? Side effects that you should report to your care team as soon as possible: Allergic reactions-skin rash, itching, hives, swelling of the face, lips, tongue, or throat Bleeding-bloody or black, tar-like stools, red or dark [place] urine, vomiting blood or Douty material that looks like coffee  grounds, small, red or purple spots on skin, unusual bleeding or bruising Heart rhythm changes-fast or irregular heartbeat, dizziness, feeling faint or lightheaded, chest pain, trouble breathing Increase in blood pressure Loss of appetite with weight loss Low sodium level-muscle weakness, fatigue, dizziness, headache, confusion Serotonin syndrome-irritability, confusion, fast or irregular heartbeat, muscle stiffness, twitching muscles, sweating, high fever, seizures, chills, vomiting, diarrhea Sudden eye pain or change in vision such as blurry vision, seeing halos around lights, vision loss Thoughts of suicide or self-harm, worsening mood, feelings of depression Side effects that usually do not require medical attention (report to your careteam if they continue or are bothersome): Anxiety, nervousness Change in sex drive or performance Dizziness Dry mouth Excessive sweating Nausea Tremors or shaking Trouble sleeping This list may not describe all possible side effects. Call your doctor for medical advice about side effects. You may report side effects to FDA at1-800-FDA-1088. Where should I keep my medication? Keep out of the reach of children and pets. Store at a controlled temperature between 20 and 25 degrees C (68 and 77 degrees F), in a dry place. Throw away any unused medication after theexpiration date. NOTE: This sheet is a summary. It may not cover all possible information. If you have questions about this medicine, talk to your doctor, pharmacist, orhealth care provider.  2022 Elsevier/Gold Standard (2020-03-24 17:16:17)  

## 2020-12-13 NOTE — Assessment & Plan Note (Signed)
Chronic and recurrent. Currently asymptomatic. Might be related to her anxiety. I reviewed her record - she had Holter monitoring in 2021 which showed tachycardia. TSH done a few months ago as well as EKG were reassuring. We will treat her anxiety and monitor for improvement. F/U soon or go to the ED if symptoms worsens. She agreed with the plan.

## 2020-12-13 NOTE — Assessment & Plan Note (Signed)
>>  ASSESSMENT AND PLAN FOR GENERALIZED ANXIETY DISORDER WRITTEN ON 12/13/2020  5:21 PM BY Doreene Eland, MD  Chronic and recurrent. Failed Lexapro, Burspar and Hydroxyzine. Discussed Psychotherapy and is open to CCM referral. Effexor recommended and she agreed to trial this. CCM referral placed and I started her on Effexor 37.5 mg qd. F/U with PCP soon for reassessment. She agreed with the plan.

## 2020-12-13 NOTE — Assessment & Plan Note (Signed)
Most recent hemoglobin is normal. I refilled her iron supplement. I discussed OTC Miralax prn constipation as a complication of iron supplement. She agreed with the plan.

## 2020-12-13 NOTE — Assessment & Plan Note (Signed)
Chronic and recurrent. Failed Lexapro, Burspar and Hydroxyzine. Discussed Psychotherapy and is open to CCM referral. Effexor recommended and she agreed to trial this. CCM referral placed and I started her on Effexor 37.5 mg qd. F/U with PCP soon for reassessment. She agreed with the plan.

## 2020-12-16 ENCOUNTER — Ambulatory Visit (HOSPITAL_COMMUNITY)
Admission: RE | Admit: 2020-12-16 | Discharge: 2020-12-16 | Disposition: A | Discharge status: Home / Self Care | Payer: BC Managed Care – PPO | Source: Ambulatory Visit | Attending: Family Medicine | Admitting: Family Medicine

## 2020-12-16 ENCOUNTER — Other Ambulatory Visit: Payer: Self-pay

## 2020-12-16 DIAGNOSIS — L729 Follicular cyst of the skin and subcutaneous tissue, unspecified: Secondary | ICD-10-CM | POA: Insufficient documentation

## 2020-12-16 DIAGNOSIS — R229 Localized swelling, mass and lump, unspecified: Secondary | ICD-10-CM | POA: Diagnosis not present

## 2020-12-19 ENCOUNTER — Telehealth: Payer: Self-pay | Admitting: Family Medicine

## 2020-12-19 NOTE — Telephone Encounter (Signed)
Normal result discussed. I advised her to return soon if the knot on her chest increases in size or his painful. She verbalizes understanding of this instruction. No further questions. She was appreciative of the call.  [place] CHEST SOFT TISSUE  Result Date: 12/19/2020 CLINICAL DATA:  35 year old female with palpable abnormality superior to the left breast and inferior to the lateral clavicle. EXAM: ULTRASOUND OF CHEST SOFT TISSUES TECHNIQUE: Ultrasound examination of the chest wall soft tissues was performed in the area of clinical concern. COMPARISON:  None. FINDINGS: Grayscale and brief color Doppler images in the area of clinical concern demonstrate a small round 7-8 mm diameter mixed echogenic focus which appears to be just deep to the skin surface in the subcutaneous fat (image 5). No internal vascular elements or regional hypervascularity. Other regional fibromuscular architecture appears normal. There do appear to be some small regional lymph nodes on the cine images (series 3, image 15). IMPRESSION: Small 8 mm round subcutaneous mass with mixed echogenicity, no hypervascularity. This is more suggestive of a sebaceous cyst or similar lesion than an abnormal lymph node. And there are nearby small physiologic lymph nodes. But recommend follow-up by clinical exam, with Chest CT (IV contrast preferred) if the area enlarges or becomes painful. Electronically Signed   By: Odessa Fleming M.D.   On: 12/19/2020 07:46

## 2021-04-30 ENCOUNTER — Other Ambulatory Visit: Payer: Self-pay | Admitting: Family Medicine

## 2021-05-01 MED ORDER — FERROUS SULFATE 324 (65 FE) MG PO TBEC: 1.0000 | DELAYED_RELEASE_TABLET | Freq: Every day | ORAL | 1 refills | Status: DC

## 2021-08-09 ENCOUNTER — Ambulatory Visit (INDEPENDENT_AMBULATORY_CARE_PROVIDER_SITE_OTHER): Payer: BC Managed Care – PPO | Admitting: Family Medicine

## 2021-08-09 VITALS — BP 114/75 | HR 98 | Ht 61.0 in | Wt 174.0 lb

## 2021-08-09 DIAGNOSIS — F411 Generalized anxiety disorder: Secondary | ICD-10-CM | POA: Diagnosis not present

## 2021-08-09 DIAGNOSIS — F418 Other specified anxiety disorders: Secondary | ICD-10-CM

## 2021-08-09 MED ORDER — VENLAFAXINE HCL ER 37.5 MG PO CP24: 37.5000 mg | ORAL_CAPSULE | Freq: Every day | ORAL | 1 refills | Status: DC

## 2021-08-09 MED ORDER — HYDROXYZINE HCL 10 MG PO TABS: 10.0000 mg | ORAL_TABLET | Freq: Three times a day (TID) | ORAL | 0 refills | Status: DC | PRN

## 2021-08-09 NOTE — Progress Notes (Signed)
? ? ?  SUBJECTIVE:  ? ?CHIEF COMPLAINT / HPI:  ?Chief Complaint  ?Patient presents with  ? Panic Attack  ?  ?Has had panic episodes in the past. Previously has tried citalopram, escitalopram, and fluoxetine which have not helped. Was most recently tried on venlafaxine. ? ?Patient states that she has had anxiety/panic episodes (chest tightness, SOB, nausea). These were occurring seldomly but have now been occurring daily for the past month. She was prescribed hydroxyzine 25 mg in the past but reports it was too sedating. She did not take the venlafaxine she was recently prescribed for very long. She has tried to get in with therapy but has been unsuccessful. She is amenable to referral to social work for assistance in establishing with therapy. ?Recent stressors: multiple family deaths, daughter going to college soon, works in Financial trader service ? ?PERTINENT  PMH / PSH: depression, anxiety ? ?Patient Care Team: ?Fayette Pho, MD as PCP - General (Family Medicine)  ? ?OBJECTIVE:  ? ?BP 114/75   Pulse 98   Ht 5\' 1"  (1.549 m)   Wt 174 lb (78.9 kg)   LMP 07/31/2021 (Approximate)   SpO2 98%   BMI 32.88 kg/m?   ?Physical Exam ?Constitutional:   ?   General: She is not in acute distress. ?Cardiovascular:  ?   Rate and Rhythm: Normal rate.  ?Pulmonary:  ?   Effort: Pulmonary effort is normal. No respiratory distress.  ?Musculoskeletal:  ?   Cervical back: Neck supple.  ?Neurological:  ?   Mental Status: She is alert.  ?Psychiatric:  ?   Comments: Affect appropriate, intermittently tearful  ?  ? ? ?  08/09/2021  ? 11:14 AM  ?Depression screen PHQ 2/9  ?Decreased Interest 2  ?Down, Depressed, Hopeless 3  ?PHQ - 2 Score 5  ?Altered sleeping 1  ?Tired, decreased energy 3  ?Change in appetite 2  ?Feeling bad or failure about yourself  3  ?Trouble concentrating 3  ?Moving slowly or fidgety/restless 3  ?Suicidal thoughts 0  ?PHQ-9 Score 20  ?Difficult doing work/chores Extremely dIfficult  ?  ? ?{Show previous vital signs  (optional):23777} ? ? ? ?ASSESSMENT/PLAN:  ? ?Generalized anxiety disorder ?Suspect having more frequent panic episodes in the setting of recent stressors. ?- CCM referral to get establish with therapist (patient amenable, prefers virtual therapy) ?- re-trial hydroxyzine at lower dose 10 mg TID prn (given sedation) ?- restart venlafaxine 37.5 mg daily ?- therapy resources provided, discussed going to [facility] if needed ?  ? ?Return in about 4 weeks (around 09/06/2021) for f/u anxiety.  ? ?09/08/2021, MD ?[facility]  ?

## 2021-08-09 NOTE — Assessment & Plan Note (Signed)
>>  ASSESSMENT AND PLAN FOR GENERALIZED ANXIETY DISORDER WRITTEN ON 08/09/2021 12:14 PM BY Littie Deeds, MD  Suspect having more frequent panic episodes in the setting of recent stressors. - CCM referral to get establish with therapist (patient amenable, prefers virtual therapy) - re-trial hydroxyzine at lower dose 10 mg TID prn (given sedation) - restart venlafaxine 37.5 mg daily - therapy resources provided, discussed going to [facility] if needed

## 2021-08-09 NOTE — Assessment & Plan Note (Signed)
Suspect having more frequent panic episodes in the setting of recent stressors. ?- CCM referral to get establish with therapist (patient amenable, prefers virtual therapy) ?- re-trial hydroxyzine at lower dose 10 mg TID prn (given sedation) ?- restart venlafaxine 37.5 mg daily ?- therapy resources provided, discussed going to [facility] if needed ?

## 2021-08-09 NOTE — Patient Instructions (Addendum)
It was nice seeing you today! ? ?Take hydroxyzine 10 mg up to 3 times a day as needed. ? ?Take venlafaxine 37.5 mg daily. This will take a few weeks to take full effect. ? ?Expect a call from the social worker. ? ?Please follow-up in 1 month with myself or Dr. Jeani Hawking. ? ?Stay well, ?Zola Button, MD ?[place] ?([phone] ? ?-- ? ? ?Therapy and Counseling Resources ?Most providers on this list will take Medicaid. Patients with commercial insurance or Medicare should contact their insurance company to get a list of in network providers. ? ?[facility] (spanish speaking therapist available)(habla espanol)(take medicare and medicaid)  ?[place], [place], [place] [address], [place] ?[email or web address] ?[phone] ? ?[facility] ?[place]. [place], [place] [address] ?[phone] ? ?[facility]  ? [address], [place], [place] [address]      [phone] ? ?[facility] (spanish available) ?[place], [place] [address] ?[phone] ? ?[facility] (take medicaid and medicare) ?[address], [place], [place] [address]       [phone]    ? ?[facility] (virtual only) ?[phone] ? ?[facility] ?[address], [place], [place] ? ?[facility]:  [address] [place] ? ?[facility]:  ?[place] [phone] ? ?[facility] (under & uninsured) ?[address] [place] [phone]    [email or web address]   ? ?[facility] ?[place] [address]  [place]    [phone] ? ?[facility] ?[place]     Phone:  [phone]     [place]  [phone]  ? ?[place] ?[address] [place], [place]  ext 1001 ? ?[facility]: [place], [place], [place]  ? ?[facility] ([place] therapist) [email or web address]  ?[place] [address]   [place]  [address]    [phone]   ? ?[facility]   ?[address] [place],  [place], [place]  ? ?[facility]  ?[address] [place]  [phone] ? ?[facility]  ?[place], [place]        [phone] ? ?Open Access/Walk In Clinic under & uninsured ? ?[facility]  ?[place], [place] ?[place] [phone] ?[place]ris[place] [phone] ? ?[facility] [place],  ?([place])   [place]: [phone]) 8:30 - 12; 1 - 2:30 ? ?[facility] [place],  ?[address] [place]    ([phone]):8:30 - 12; 2 - 3PM ? ?[facility],  ?[address],  [place]; [phone]):   Mon - Fri 8 AM - 5 PM ? ?[facility] ?[place]  MWF 12:30 to 3:00 or call to schedule an appointment  [phone] ? ?Specific Provider options ?[facility]  [email or web address]://www.psychologytoday.com/us ?click on find a therapist  ?enter your zip code ?left side and select or tailor a therapist for your specific need.  ? ?[facility] Provider Directory ?[email or web address]  (Medicaid)   Follow all drop down to find a provider ? ?Social Support program ?[facility] ?336) [phone] or  [email or web address] ?[address], [place], [place] Recovery support and educational  ? ?24- Hour Availability:  ? ?[facility]  ?[place], [place] ?[place] [phone] ?Crisis [phone] ? ?[facility] [employer] [phone] ? ?[facility]  [phone]  ? ?[facility]  [phone] (after hours) ? ?Therapeutic Alternative/Mobile Crisis    [phone] ? ?[place] National Suicide Hotline  534-860-9053 Diamantina Monks) ? ?Call 911 or go to emergency room ? ?[employer]  [phone]);  [place] and [place]  ? ?[facility]  ?([phone]); [place], [place], [place], [place], [place], [place], [place]  ? ?Make sure to check out at the front desk before you leave today. ? ?Please arrive at least 15 minutes prior to your scheduled appointments. ? ?If you had blood work today, I will send you a MyChart message or a letter if results are normal. Otherwise, I will give you a call. ? ?If you had a referral placed, they will call you to set up an appointment. Please give [place] a call if you don't hear back in the next 2 weeks. ? ?If you need additional refills before your next appointment, please call your pharmacy first.  ?

## 2021-08-10 ENCOUNTER — Telehealth: Payer: Self-pay | Admitting: *Deleted

## 2021-08-10 NOTE — Chronic Care Management (AMB) (Signed)
  Care Management   Note  08/10/2021 Name: Kerry Chavez MRN: [id number] DOB: Nov 02, 1985  Kerry Chavez is a 36 y.o. year old female who is a primary care patient of Fayette Pho, MD. I reached out to [name] by phone today offer care coordination services.   Kerry Chavez was given information about care management services today including:  Care management services include personalized support from designated clinical staff supervised by her physician, including individualized plan of care and coordination with other care providers 24/7 contact phone numbers for assistance for urgent and routine care needs. The patient may stop care management services at any time by phone call to the office staff.  Patient agreed to services and verbal consent obtained.   Follow up plan: Telephone appointment with care management team member scheduled for:08/23/21  [facility] Guide, Embedded Care Coordination [facility]  Care Management  Direct Dial: [phone]

## 2021-08-23 ENCOUNTER — Telehealth: Payer: Self-pay | Admitting: Licensed Clinical Social Worker

## 2021-08-23 ENCOUNTER — Telehealth: Payer: BC Managed Care – PPO | Admitting: Licensed Clinical Social Worker

## 2021-08-23 NOTE — Telephone Encounter (Signed)
  Care Management   Follow Up Note   08/23/2021 Name: Kerry Chavez MRN: [id number] DOB: 01-Jul-1985   Referred by: Fayette Pho, MD Reason for referral : No chief complaint on file.   An unsuccessful telephone outreach was attempted today. The patient was referred to the case management team for assistance with care management and care coordination.   Follow Up Plan: Patients mailbox was full and SW unable to leave a VM. SW will make a 2nd attempt within the nexy 30-days.   Christen Butter, BSW  Social Worker [facility] Care Management  [phone]

## 2021-08-28 ENCOUNTER — Telehealth: Payer: Self-pay | Admitting: *Deleted

## 2021-08-28 NOTE — Chronic Care Management (AMB) (Signed)
  Care Coordination Note  08/28/2021 Name: Kerry Chavez MRN: [id number] DOB: [birth year]  Kerry Chavez is a 37 y.o. year old female who is a primary care patient of Fayette Pho, MD and is actively engaged with the care management team. I reached out to Kerry Chavez by phone today to assist with re-scheduling an initial visit with the BSW  Follow up plan: Unsuccessful telephone outreach attempt made. A HIPAA compliant phone message was left for the patient providing contact information and requesting a return call.  The care management team will reach out to the patient again over the next 7 days.  If patient returns call to provider office, please advise to call Embedded Care Management Care Guide Kerry Chavez at [phone].  Kerry Chavez  Care Guide, Embedded Care Coordination [facility] Management  Direct Dial: [phone]

## 2021-08-29 ENCOUNTER — Encounter: Payer: Self-pay | Admitting: *Deleted

## 2021-09-04 NOTE — Chronic Care Management (AMB) (Signed)
  Care Coordination Note  09/04/2021 Name: Kerry Chavez MRN: [id number] DOB: 19-May-1985  Kerry Chavez is a 36 y.o. year old female who is a primary care patient of Fayette Pho, MD and is actively engaged with the care management team. I reached out to [name] by phone today to assist with re-scheduling an initial visit with the BSW  Follow up plan: Telephone appointment with care management team member scheduled for:09/27/21  [facility] Guide, Embedded Care Coordination [facility]  Care Management  Direct Dial: [phone]

## 2021-09-27 ENCOUNTER — Ambulatory Visit: Payer: BC Managed Care – PPO | Admitting: Licensed Clinical Social Worker

## 2021-09-27 NOTE — Patient Instructions (Signed)
Visit Information  Instructions:   Patient was given the following information about care management and care coordination services today, agreed to services, and gave verbal consent: 1.care management/care coordination services include personalized support from designated clinical staff supervised by their physician, including individualized plan of care and coordination with other care providers 2. 24/7 contact phone numbers for assistance for urgent and routine care needs. 3. The patient may stop care management/care coordination services at any time by phone call to the office staff.  Patient verbalizes understanding of instructions and care plan provided today and agrees to view in MyChart. Active MyChart status and patient understanding of how to access instructions and care plan via MyChart confirmed with patient.     No further follow up required: .  Agnes Probert, BSW , MSW Social Worker [facility] Care Management  [phone]      

## 2021-09-27 NOTE — Chronic Care Management (AMB) (Signed)
  Care Management   Social Work Visit Note  09/27/2021 Name: Kerry Chavez MRN: [id number] DOB: [birth year]  Kerry Chavez is a 36 y.o. year old female who sees Fayette Pho, MD for primary care. The care management team was consulted for assistance with care management and care coordination needs related to [facility] Resources    Patient was given the following information about care management and care coordination services today, agreed to services, and gave verbal consent: 1.care management/care coordination services include personalized support from designated clinical staff supervised by their physician, including individualized plan of care and coordination with other care providers 2. 24/7 contact phone numbers for assistance for urgent and routine care needs. 3. The patient may stop care management/care coordination services at any time by phone call to the office staff.  Engaged with patient by telephone for initial visit in response to provider referral for social work chronic care management and care coordination services.  Assessment: Review of patient history, allergies, and health status during evaluation of patient need for care management/care coordination services.    Interventions:  Patient interviewed and appropriate assessments performed Collaborated with clinical team regarding patient needs  Successful outreach to patient on today. SDOH Screening and needs assessment completed.  Patient needs assistance establishing a therapist.  SW referred patient to Psychology today and emailed link. SW educated patient on psychology today. Patient is excited about connecting with someone virtually.  Patient agreed to make contact with therapist on 09/28/2021.    SDOH (Social Determinants of Health) assessments performed: Yes     Plan:  No additional follow up needed at this time. If further assistance is needed, patient has SW email and can contact PCP.   Ander Gaster ,  MSW Social Worker [facility] Care Management  [phone]

## 2021-11-24 DIAGNOSIS — I608 Other nontraumatic subarachnoid hemorrhage: Secondary | ICD-10-CM

## 2021-11-24 HISTORY — DX: Other nontraumatic subarachnoid hemorrhage: I60.8

## 2021-12-13 DIAGNOSIS — I639 Cerebral infarction, unspecified: Secondary | ICD-10-CM

## 2021-12-13 HISTORY — DX: Cerebral infarction, unspecified: I63.9

## 2021-12-20 ENCOUNTER — Emergency Department (HOSPITAL_COMMUNITY): Payer: BC Managed Care – PPO

## 2021-12-20 ENCOUNTER — Inpatient Hospital Stay (HOSPITAL_COMMUNITY)
Admission: EM | Admit: 2021-12-20 | Discharge: 2021-12-23 | DRG: 022 | Disposition: A | Discharge status: Home / Self Care | Payer: BC Managed Care – PPO | Attending: Neurosurgery | Admitting: Neurosurgery

## 2021-12-20 DIAGNOSIS — F32A Depression, unspecified: Secondary | ICD-10-CM | POA: Diagnosis present

## 2021-12-20 DIAGNOSIS — F17210 Nicotine dependence, cigarettes, uncomplicated: Secondary | ICD-10-CM | POA: Diagnosis present

## 2021-12-20 DIAGNOSIS — I609 Nontraumatic subarachnoid hemorrhage, unspecified: Secondary | ICD-10-CM | POA: Diagnosis not present

## 2021-12-20 DIAGNOSIS — M436 Torticollis: Secondary | ICD-10-CM | POA: Diagnosis not present

## 2021-12-20 DIAGNOSIS — F419 Anxiety disorder, unspecified: Secondary | ICD-10-CM | POA: Diagnosis present

## 2021-12-20 DIAGNOSIS — G43909 Migraine, unspecified, not intractable, without status migrainosus: Secondary | ICD-10-CM | POA: Diagnosis not present

## 2021-12-20 DIAGNOSIS — I6031 Nontraumatic subarachnoid hemorrhage from right posterior communicating artery: Secondary | ICD-10-CM | POA: Diagnosis not present

## 2021-12-20 DIAGNOSIS — R55 Syncope and collapse: Principal | ICD-10-CM

## 2021-12-20 DIAGNOSIS — F418 Other specified anxiety disorders: Secondary | ICD-10-CM | POA: Diagnosis not present

## 2021-12-20 DIAGNOSIS — I729 Aneurysm of unspecified site: Secondary | ICD-10-CM

## 2021-12-20 DIAGNOSIS — Z880 Allergy status to penicillin: Secondary | ICD-10-CM | POA: Diagnosis not present

## 2021-12-20 DIAGNOSIS — Z882 Allergy status to sulfonamides status: Secondary | ICD-10-CM

## 2021-12-20 DIAGNOSIS — Z8249 Family history of ischemic heart disease and other diseases of the circulatory system: Secondary | ICD-10-CM | POA: Diagnosis not present

## 2021-12-20 DIAGNOSIS — J45909 Unspecified asthma, uncomplicated: Secondary | ICD-10-CM | POA: Diagnosis not present

## 2021-12-20 DIAGNOSIS — R519 Headache, unspecified: Secondary | ICD-10-CM | POA: Diagnosis not present

## 2021-12-20 DIAGNOSIS — R1111 Vomiting without nausea: Secondary | ICD-10-CM | POA: Diagnosis not present

## 2021-12-20 DIAGNOSIS — G4489 Other headache syndrome: Secondary | ICD-10-CM | POA: Diagnosis not present

## 2021-12-20 DIAGNOSIS — Z79899 Other long term (current) drug therapy: Secondary | ICD-10-CM

## 2021-12-20 DIAGNOSIS — F439 Reaction to severe stress, unspecified: Secondary | ICD-10-CM | POA: Diagnosis present

## 2021-12-20 DIAGNOSIS — F17200 Nicotine dependence, unspecified, uncomplicated: Secondary | ICD-10-CM | POA: Diagnosis not present

## 2021-12-20 DIAGNOSIS — I629 Nontraumatic intracranial hemorrhage, unspecified: Secondary | ICD-10-CM

## 2021-12-20 DIAGNOSIS — Z833 Family history of diabetes mellitus: Secondary | ICD-10-CM | POA: Diagnosis not present

## 2021-12-20 DIAGNOSIS — Z91041 Radiographic dye allergy status: Secondary | ICD-10-CM

## 2021-12-20 DIAGNOSIS — I608 Other nontraumatic subarachnoid hemorrhage: Secondary | ICD-10-CM | POA: Diagnosis present

## 2021-12-20 DIAGNOSIS — I671 Cerebral aneurysm, nonruptured: Secondary | ICD-10-CM | POA: Diagnosis not present

## 2021-12-20 DIAGNOSIS — R42 Dizziness and giddiness: Secondary | ICD-10-CM | POA: Diagnosis not present

## 2021-12-20 DIAGNOSIS — Z91040 Latex allergy status: Secondary | ICD-10-CM

## 2021-12-20 DIAGNOSIS — R457 State of emotional shock and stress, unspecified: Secondary | ICD-10-CM | POA: Diagnosis not present

## 2021-12-20 LAB — COMPREHENSIVE METABOLIC PANEL
ALT: 18 U/L (ref 0–44)
AST: 20 U/L (ref 15–41)
Albumin: 3.4 g/dL — ABNORMAL LOW (ref 3.5–5.0)
Alkaline Phosphatase: 63 U/L (ref 38–126)
Anion gap: 8 (ref 5–15)
BUN: 9 mg/dL (ref 6–20)
CO2: 20 mmol/L — ABNORMAL LOW (ref 22–32)
Calcium: 8.5 mg/dL — ABNORMAL LOW (ref 8.9–10.3)
Chloride: 110 mmol/L (ref 98–111)
Creatinine, Ser: 0.64 mg/dL (ref 0.44–1.00)
GFR, Estimated: >60 mL/min (ref >60)
Glucose, Bld: 119 mg/dL — ABNORMAL HIGH (ref 70–99)
Potassium: 3.3 mmol/L — ABNORMAL LOW (ref 3.5–5.1)
Sodium: 138 mmol/L (ref 135–145)
Total Bilirubin: 0.3 mg/dL (ref 0.3–1.2)
Total Protein: 6.2 g/dL — ABNORMAL LOW (ref 6.5–8.1)

## 2021-12-20 LAB — CBC WITH DIFFERENTIAL/PLATELET
Abs Immature Granulocytes: 0.08 10*3/uL — ABNORMAL HIGH (ref 0.00–0.07)
Basophils Absolute: 0.1 10*3/uL (ref 0.0–0.1)
Basophils Relative: 0 %
Eosinophils Absolute: 0.3 10*3/uL (ref 0.0–0.5)
Eosinophils Relative: 2 %
HCT: 32.5 % — ABNORMAL LOW (ref 36.0–46.0)
Hemoglobin: 10.9 g/dL — ABNORMAL LOW (ref 12.0–15.0)
Immature Granulocytes: 1 %
Lymphocytes Relative: 28 %
Lymphs Abs: 4.6 10*3/uL — ABNORMAL HIGH (ref 0.7–4.0)
MCH: 31.3 pg (ref 26.0–34.0)
MCHC: 33.5 g/dL (ref 30.0–36.0)
MCV: 93.4 fL (ref 80.0–100.0)
Monocytes Absolute: 1 10*3/uL (ref 0.1–1.0)
Monocytes Relative: 6 %
Neutro Abs: 10.1 10*3/uL — ABNORMAL HIGH (ref 1.7–7.7)
Neutrophils Relative %: 63 %
Platelets: 258 10*3/uL (ref 150–400)
RBC: 3.48 MIL/uL — ABNORMAL LOW (ref 3.87–5.11)
RDW: 17 % — ABNORMAL HIGH (ref 11.5–15.5)
WBC: 16.2 10*3/uL — ABNORMAL HIGH (ref 4.0–10.5)
nRBC: 0 % (ref 0.0–0.2)

## 2021-12-20 LAB — I-STAT BETA HCG BLOOD, ED (MC, WL, AP ONLY): I-stat hCG, quantitative: <5 m[IU]/mL (ref <5)

## 2021-12-20 LAB — ETHANOL: Alcohol, Ethyl (B): <10 mg/dL (ref <10)

## 2021-12-20 MED ORDER — METOCLOPRAMIDE HCL 5 MG/ML IJ SOLN
10.0000 mg | Freq: Once | INTRAMUSCULAR | Status: AC
  Administered 2021-12-20: 10 mg via INTRAVENOUS
  Filled 2021-12-20: qty 2

## 2021-12-20 MED ORDER — SODIUM CHLORIDE 0.9 % IV BOLUS
1000.0000 mL | Freq: Once | INTRAVENOUS | Status: AC
  Administered 2021-12-20: 1000 mL via INTRAVENOUS

## 2021-12-20 MED ORDER — DIPHENHYDRAMINE HCL 50 MG/ML IJ SOLN
25.0000 mg | Freq: Once | INTRAMUSCULAR | Status: AC
  Administered 2021-12-20: 25 mg via INTRAVENOUS
  Filled 2021-12-20: qty 1

## 2021-12-20 MED ORDER — GADOPICLENOL 0.5 MMOL/ML IV SOLN
8.0000 mL | Freq: Once | INTRAVENOUS | Status: AC | PRN
  Administered 2021-12-21: 8 mL via INTRAVENOUS

## 2021-12-20 NOTE — ED Notes (Signed)
Attempted to obtain urine sample. Pt states she is unable to urinate at this time.

## 2021-12-20 NOTE — ED Triage Notes (Signed)
Pt to ED via EMS. Pt states she was braiding her daughters hair when she felt dizzy and had syncopal episode with no fall, no trauma. Unknown time of LOC. Pt c/o headache before passing out. Pt AAOx4 upon EMS arrival. Pt also c/o N/V with EMS. Pt states she took 2 valium today for anxiety / stress. States she has not drank much fluids today.   EMS Vitals: 130/82 123 CBG 80s HR 99-100% RA 20 LAC 4 Zofran IV

## 2021-12-20 NOTE — ED Triage Notes (Signed)
Pt c/o headache, photophobia, and stiff neck.

## 2021-12-20 NOTE — ED Provider Notes (Signed)
[facility] EMERGENCY DEPARTMENT Provider Note   CSN: [id number] Arrival date & time: 12/20/21  1945     History Anxiety, depression Chief Complaint  Patient presents with   Loss of Consciousness    Kerry Chavez is a 36 y.o. female.  36 year old female with past medical history of asthma and depression, anxiety presents to the ED via EMS status post syncopal episode.  According to daughter who is reporting most of the history at the bedside, states patient was braiding hair in the kitchen when she stated "all my God", reports she was grabbing at her head she lowered her to the ground she did not strike her head, states that she was in and out unresponsive for a couple minutes.  States EMS arrived and was also complaining of nausea and vomiting.  She did take 2 Valium today for anxiety.  According to daughter patient has been under a lot of stress lately, she does not drink much water, states she has a prior history of headaches with migraines however is currently on no medication for prophylactic treatment.  Does not have any history of seizure disorder, no changes in her speech, no changes in her gait.  No trauma.  Patient is currently on no blood thinners.  No chest pain, no shortness of breath, no neck pain, no fever.  The history is provided by the patient and medical records.  Loss of Consciousness Episode history:  Single Most recent episode:  Today Associated symptoms: headaches   Associated symptoms: no chest pain, no fever, no nausea, no shortness of breath and no vomiting        Home Medications Prior to Admission medications   Medication Sig Start Date End Date Taking? Authorizing Provider  hydrOXYzine (ATARAX) 10 MG tablet Take 1 tablet (10 mg total) by mouth 3 (three) times daily as needed. Patient taking differently: Take 10 mg by mouth 3 (three) times daily as needed for anxiety or itching. 08/09/21  Yes Zola Button, MD  ibuprofen (ADVIL) 200 MG  tablet Take 400 mg by mouth daily as needed for headache.   Yes [provider]  albuterol (PROVENTIL HFA;VENTOLIN HFA) 108 (90 BASE) MCG/ACT inhaler Inhale 2 puffs into the lungs every 4 (four) hours as needed for wheezing or shortness of breath. Pt states uses seasonally during winter. Patient not taking: Reported on 12/21/2021    [provider]  ferrous sulfate 324 (65 Fe) MG TBEC Take 1 tablet (325 mg total) by mouth daily. Patient not taking: Reported on 12/21/2021 05/01/21   Ezequiel Essex, MD  venlafaxine XR (EFFEXOR XR) 37.5 MG 24 hr capsule Take 1 capsule (37.5 mg total) by mouth daily with breakfast. Patient not taking: Reported on 12/21/2021 08/09/21   Zola Button, MD  Vitamin D, Ergocalciferol, (DRISDOL) 1.25 MG (50000 UNIT) CAPS capsule Take 1 capsule (50,000 Units total) by mouth every 7 (seven) days. Patient not taking: Reported on 12/13/2020 06/22/20   Benay Pike, MD  citalopram (CELEXA) 20 MG tablet Take 1 tablet (20 mg total) by mouth daily. 01/25/20 06/18/20  Benay Pike, MD      Allergies    Shellfish allergy, Ivp dye [iodinated contrast media], Penicillins, Sulfamethoxazole, and Latex    Review of Systems   Review of Systems  Constitutional:  Negative for fever.  HENT:  Negative for sore throat.   Eyes:  Positive for photophobia.  Respiratory:  Negative for shortness of breath.   Cardiovascular:  Positive for syncope. Negative for  chest pain.  Gastrointestinal:  Negative for abdominal pain, diarrhea, nausea and vomiting.  Musculoskeletal:  Negative for neck pain.  Neurological:  Positive for headaches.  All other systems reviewed and are negative.   Physical Exam Updated Vital Signs BP 98/67   Pulse 70   Temp 98.2 F (36.8 C) (Oral)   Resp 17   Ht  (1.549 m)   Wt 83.2 kg   SpO2 93%   BMI 34.66 kg/m  Physical Exam Vitals and nursing note reviewed.  Constitutional:      Appearance: Normal appearance.  HENT:     Head:  Normocephalic and atraumatic.     Mouth/Throat:     Mouth: Mucous membranes are moist.  Eyes:     Pupils: Pupils are equal, round, and reactive to light.  Cardiovascular:     Rate and Rhythm: Normal rate.  Pulmonary:     Effort: Pulmonary effort is normal.     Breath sounds: No wheezing.  Abdominal:     General: Abdomen is flat.     Tenderness: There is no abdominal tenderness.  Musculoskeletal:     Cervical back: Normal range of motion and neck supple.  Skin:    General: Skin is warm and dry.  Neurological:     Mental Status: She is alert and oriented to person, place, and time.     Comments: Alert, oriented, thought content appropriate. Speech fluent without evidence of aphasia. Able to follow 2 step commands without difficulty.  Cranial Nerves:  II:  Peripheral visual fields grossly normal, pupils, round, reactive to light III,IV, VI: ptosis not present, extra-ocular motions intact bilaterally  V,VII: smile symmetric, facial light touch sensation equal VIII: hearing grossly normal bilaterally  IX,X: midline uvula rise  XI: bilateral shoulder shrug equal and strong XII: midline tongue extension  Motor:  5/5 in upper and lower extremities bilaterally including strong and equal grip strength and dorsiflexion/plantar flexion Sensory: light touch normal in all extremities.  Cerebellar: normal finger-to-nose with bilateral upper extremities, pronator drift negative        ED Results / Procedures / Treatments   Labs (all labs ordered are listed, but only abnormal results are displayed) Labs Reviewed  CBC WITH DIFFERENTIAL/PLATELET - Abnormal; Notable for the following components:      Result Value   WBC 16.2 (*)    RBC 3.48 (*)    Hemoglobin 10.9 (*)    HCT 32.5 (*)    RDW 17.0 (*)    Neutro Abs 10.1 (*)    Lymphs Abs 4.6 (*)    Abs Immature Granulocytes 0.08 (*)    All other components within normal limits  COMPREHENSIVE METABOLIC PANEL - Abnormal; Notable for the  following components:   Potassium 3.3 (*)    CO2 20 (*)    Glucose, Bld 119 (*)    Calcium 8.5 (*)    Total Protein 6.2 (*)    Albumin 3.4 (*)    All other components within normal limits  URINALYSIS, ROUTINE W REFLEX MICROSCOPIC - Abnormal; Notable for the following components:   Color, Urine RED (*)    APPearance TURBID (*)    Glucose, UA   (*)    Value: TEST NOT REPORTED DUE TO COLOR INTERFERENCE OF URINE PIGMENT   Hgb urine dipstick   (*)    Value: TEST NOT REPORTED DUE TO COLOR INTERFERENCE OF URINE PIGMENT   Bilirubin Urine   (*)    Value: TEST NOT REPORTED DUE TO COLOR  INTERFERENCE OF URINE PIGMENT   Ketones, ur   (*)    Value: TEST NOT REPORTED DUE TO COLOR INTERFERENCE OF URINE PIGMENT   Protein, ur   (*)    Value: TEST NOT REPORTED DUE TO COLOR INTERFERENCE OF URINE PIGMENT   Nitrite   (*)    Value: TEST NOT REPORTED DUE TO COLOR INTERFERENCE OF URINE PIGMENT   Leukocytes,Ua   (*)    Value: TEST NOT REPORTED DUE TO COLOR INTERFERENCE OF URINE PIGMENT   RBC / HPF >50 (*)    WBC, UA >50 (*)    Bacteria, UA RARE (*)    All other components within normal limits  RAPID URINE DRUG SCREEN, HOSP PERFORMED - Abnormal; Notable for the following components:   Benzodiazepines POSITIVE (*)    Tetrahydrocannabinol POSITIVE (*)    All other components within normal limits  CBC - Abnormal; Notable for the following components:   WBC 17.0 (*)    RBC 3.48 (*)    Hemoglobin 10.7 (*)    HCT 32.3 (*)    RDW 16.6 (*)    All other components within normal limits  MRSA NEXT GEN BY PCR, NASAL  ETHANOL  HIV ANTIBODY (ROUTINE TESTING W REFLEX)  PROTIME-INR  APTT  GLUCOSE, CAPILLARY  I-STAT BETA HCG BLOOD, ED ([facility], [facility], [facility] ONLY)    EKG EKG Interpretation  Date/Time:  Wednesday December 20 2021 19:54:34 EDT Ventricular Rate:  73 PR Interval:  164 QRS Duration: 89 QT Interval:  430 QTC Calculation: 474 R Axis:   67 Text Interpretation: Sinus rhythm Confirmed by Virgina Norfolk  ([id number]) on 12/20/2021 8:09:33 PM  Radiology MR BRAIN W WO CONTRAST  Result Date: 12/21/2021 CLINICAL DATA:  Headache, dizziness, syncopal episode EXAM: MRI HEAD WITHOUT AND WITH CONTRAST MRA HEAD WITHOUT CONTRAST MRA NECK WITHOUT AND WITH CONTRAST TECHNIQUE: Multiplanar, multi-echo pulse sequences of the brain and surrounding structures were acquired without and with intravenous contrast. Angiographic images of the Circle of Willis were acquired using MRA technique without intravenous contrast. Angiographic images of the neck were acquired using MRA technique without and with intravenous contrast. Carotid stenosis measurements (when applicable) are obtained utilizing NASCET criteria, using the distal internal carotid diameter as the denominator. CONTRAST:  8 mL Gadavist COMPARISON:  No prior MRI, correlation is made with CT head 12/20/2021 FINDINGS: MRI HEAD FINDINGS Brain: Possible minimally enhancing material in the interpeduncular and ambient cisterns (series 22, image 21 and series 20, image 21), which does not suppress on FLAIR (series 15, image 11). Similar FLAIR hyperintense material is noted in the fourth ventricle and possibly along the cerebellopontine angles (series 15, image 8), although the cerebellopontine angles are often associated with incomplete suppression of FLAIR. The basal cisterns and fourth ventricle are also associated with susceptibility (series 18, image 14 and 20), with additional susceptibility in the left occipital horn and third ventricle (series 18, image 25). No restricted diffusion to suggest acute or subacute infarct. No mass, mass effect, or midline shift. No restricted Vascular: Please see MRA findings below. Skull and upper cervical spine: Normal marrow signal. Sinuses/Orbits: No acute finding. Other: The mastoids are well aerated. MRA HEAD FINDINGS Anterior circulation: Both internal carotid arteries are patent to the termini, without significant stenosis. 2 mm medially  directed right paraophthalmic outpouching, likely a tiny aneurysm (series 1, image 112). At the right ICA terminus, there is a 7 x 6 x 4 mm outpouching (series 1, image 108 and series 1034, image 176), also likely  an aneurysm. 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 A1 segments patent. Normal anterior communicating artery. Anterior cerebral arteries are patent to their distal aspects. No M1 stenosis or occlusion. Normal MCA bifurcations. Distal MCA branches perfused and symmetric. Posterior circulation: Vertebral arteries patent to the vertebrobasilar junction without stenosis. Posterior inferior cerebral arteries patent bilaterally. Basilar patent to its distal aspect. Superior cerebellar arteries patent bilaterally. Patent P1 segments. PCAs perfused to their distal aspects without stenosis. The bilateral posterior communicating arteries are not visualized. Anatomic variants: None significant MRA NECK FINDINGS Aortic arch: Standard branching. Imaged portion shows no evidence of aneurysm or dissection. No significant stenosis of the major arch vessel origins. Right carotid system: No evidence of stenosis, dissection, or occlusion. Left carotid system: No evidence of stenosis, dissection, or occlusion. Vertebral arteries: No evidence of stenosis, dissection, or occlusion. Other: None IMPRESSION: 1. Minimally enhancing material in the interpeduncular and ambient cisterns, with T2 hyperintense material that does not suppress on FLAIR in these locations, as well as along the cerebellopontine angles and in the fourth ventricle. Some of this material likely represents hemorrhage, given associated susceptibility, with an additional small amount of hemorrhage in the left occipital horn and third ventricle. These findings are overall concerning for subarachnoid hemorrhage of indeterminate etiology, but these findings  could also be seen with meningitis, neurosarcoidosis, or metastatic disease, although these are felt to be less likely. 2. 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 Additional smaller aneurysms in the proximal supraclinoid ICA bilaterally, measuring up to 4 mm on the left and 2 mm on the right. 3. No intracranial large vessel occlusion or significant stenosis. 4. No hemodynamically significant stenosis in the neck. These results were called by telephone at the time of interpretation on 12/21/2021 at 1:18 am to provider [facility] , who verbally acknowledged these results. Electronically Signed   By: Wiliam Ke M.D.   On: 12/21/2021 01:20   MR Angiogram Neck W or Wo Contrast  Result Date: 12/21/2021 CLINICAL DATA:  Headache, dizziness, syncopal episode EXAM: MRI HEAD WITHOUT AND WITH CONTRAST MRA HEAD WITHOUT CONTRAST MRA NECK WITHOUT AND WITH CONTRAST TECHNIQUE: Multiplanar, multi-echo pulse sequences of the brain and surrounding structures were acquired without and with intravenous contrast. Angiographic images of the Circle of Willis were acquired using MRA technique without intravenous contrast. Angiographic images of the neck were acquired using MRA technique without and with intravenous contrast. Carotid stenosis measurements (when applicable) are obtained utilizing NASCET criteria, using the distal internal carotid diameter as the denominator. CONTRAST:  8 mL Gadavist COMPARISON:  No prior MRI, correlation is made with CT head 12/20/2021 FINDINGS: MRI HEAD FINDINGS Brain: Possible minimally enhancing material in the interpeduncular and ambient cisterns (series 22, image 21 and series 20, image 21), which does not suppress on FLAIR (series 15, image 11). Similar FLAIR hyperintense material is noted in the fourth ventricle and possibly along the cerebellopontine angles (series 15, image 8), although the cerebellopontine angles are often associated with incomplete  suppression of FLAIR. The basal cisterns and fourth ventricle are also associated with susceptibility (series 18, image 14 and 20), with additional susceptibility in the left occipital horn and third ventricle (series 18, image 25). No restricted diffusion to suggest acute or subacute infarct. No mass, mass effect, or midline shift. No restricted Vascular: Please see MRA findings below. Skull  and upper cervical spine: Normal marrow signal. Sinuses/Orbits: No acute finding. Other: The mastoids are well aerated. MRA HEAD FINDINGS Anterior circulation: Both internal carotid arteries are patent to the termini, without significant stenosis. 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 At the right ICA terminus, there is a 7 x 6 x 4 mm outpouching (series 1, image 108 and series 1034, image 176), also likely an aneurysm. 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 A1 segments patent. Normal anterior communicating artery. Anterior cerebral arteries are patent to their distal aspects. No M1 stenosis or occlusion. Normal MCA bifurcations. Distal MCA branches perfused and symmetric. Posterior circulation: Vertebral arteries patent to the vertebrobasilar junction without stenosis. Posterior inferior cerebral arteries patent bilaterally. Basilar patent to its distal aspect. Superior cerebellar arteries patent bilaterally. Patent P1 segments. PCAs perfused to their distal aspects without stenosis. The bilateral posterior communicating arteries are not visualized. Anatomic variants: None significant MRA NECK FINDINGS Aortic arch: Standard branching. Imaged portion shows no evidence of aneurysm or dissection. No significant stenosis of the major arch vessel origins. Right carotid system: No evidence of stenosis, dissection, or occlusion. Left carotid system: No evidence  of stenosis, dissection, or occlusion. Vertebral arteries: No evidence of stenosis, dissection, or occlusion. Other: None IMPRESSION: 1. Minimally enhancing material in the interpeduncular and ambient cisterns, with T2 hyperintense material that does not suppress on FLAIR in these locations, as well as along the cerebellopontine angles and in the fourth ventricle. Some of this material likely represents hemorrhage, given associated susceptibility, with an additional small amount of hemorrhage in the left occipital horn and third ventricle. These findings are overall concerning for subarachnoid hemorrhage of indeterminate etiology, but these findings could also be seen with meningitis, neurosarcoidosis, or metastatic disease, although these are felt to be less likely. 2. 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 Additional smaller aneurysms in the proximal supraclinoid ICA bilaterally, measuring up to 4 mm on the left and 2 mm on the right. 3. No intracranial large vessel occlusion or significant stenosis. 4. No hemodynamically significant stenosis in the neck. These results were called by telephone at the time of interpretation on 12/21/2021 at 1:18 am to provider [facility] , who verbally acknowledged these results. Electronically Signed   By: Wiliam Ke M.D.   On: 12/21/2021 01:20   MR ANGIO HEAD WO CONTRAST  Result Date: 12/21/2021 CLINICAL DATA:  Headache, dizziness, syncopal episode EXAM: MRI HEAD WITHOUT AND WITH CONTRAST MRA HEAD WITHOUT CONTRAST MRA NECK WITHOUT AND WITH CONTRAST TECHNIQUE: Multiplanar, multi-echo pulse sequences of the brain and surrounding structures were acquired without and with intravenous contrast. Angiographic images of the Circle of Willis were acquired using MRA technique without intravenous contrast. Angiographic images of the neck were acquired using MRA technique without and with intravenous contrast. Carotid stenosis measurements (when  applicable) are obtained utilizing NASCET criteria, using the distal internal carotid diameter as the denominator. CONTRAST:  8 mL Gadavist COMPARISON:  No prior MRI, correlation is made with CT head 12/20/2021 FINDINGS: MRI HEAD FINDINGS Brain: Possible minimally enhancing material in the interpeduncular and ambient cisterns (series 22, image 21 and series 20, image 21), which does not suppress on FLAIR (series 15, image 11). Similar FLAIR hyperintense material is noted in the fourth ventricle and possibly along the cerebellopontine angles (  series 15, image 8), although the cerebellopontine angles are often associated with incomplete suppression of FLAIR. The basal cisterns and fourth ventricle are also associated with susceptibility (series 18, image 14 and 20), with additional susceptibility in the left occipital horn and third ventricle (series 18, image 25). No restricted diffusion to suggest acute or subacute infarct. No mass, mass effect, or midline shift. No restricted Vascular: Please see MRA findings below. Skull and upper cervical spine: Normal marrow signal. Sinuses/Orbits: No acute finding. Other: The mastoids are well aerated. MRA HEAD FINDINGS Anterior circulation: Both internal carotid arteries are patent to the termini, without significant stenosis. 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 At the right ICA terminus, there is a 7 x 6 x 4 mm outpouching (series 1, image 108 and series 1034, image 176), also likely an aneurysm. 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 A1 segments patent. Normal anterior communicating artery. Anterior cerebral arteries are patent to their distal aspects. No M1 stenosis or occlusion. Normal MCA bifurcations. Distal MCA branches perfused and symmetric. Posterior circulation: Vertebral arteries patent to  the vertebrobasilar junction without stenosis. Posterior inferior cerebral arteries patent bilaterally. Basilar patent to its distal aspect. Superior cerebellar arteries patent bilaterally. Patent P1 segments. PCAs perfused to their distal aspects without stenosis. The bilateral posterior communicating arteries are not visualized. Anatomic variants: None significant MRA NECK FINDINGS Aortic arch: Standard branching. Imaged portion shows no evidence of aneurysm or dissection. No significant stenosis of the major arch vessel origins. Right carotid system: No evidence of stenosis, dissection, or occlusion. Left carotid system: No evidence of stenosis, dissection, or occlusion. Vertebral arteries: No evidence of stenosis, dissection, or occlusion. Other: None IMPRESSION: 1. Minimally enhancing material in the interpeduncular and ambient cisterns, with T2 hyperintense material that does not suppress on FLAIR in these locations, as well as along the cerebellopontine angles and in the fourth ventricle. Some of this material likely represents hemorrhage, given associated susceptibility, with an additional small amount of hemorrhage in the left occipital horn and third ventricle. These findings are overall concerning for subarachnoid hemorrhage of indeterminate etiology, but these findings could also be seen with meningitis, neurosarcoidosis, or metastatic disease, although these are felt to be less likely. 2. 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 Additional smaller aneurysms in the proximal supraclinoid ICA bilaterally, measuring up to 4 mm on the left and 2 mm on the right. 3. No intracranial large vessel occlusion or significant stenosis. 4. No hemodynamically significant stenosis in the neck. These results were called by telephone at the time of interpretation on 12/21/2021 at 1:18 am [facility] , who verbally acknowledged these results. Electronically Signed   By: Wiliam KeAlison   Vasan M.D.   On: 12/21/2021 01:20   CT HEAD WO CONTRAST (5MM)  Result Date: 12/20/2021 CLINICAL DATA:  Headache, new or worsening, positional. Photophobia and stiff neck. EXAM: CT HEAD WITHOUT CONTRAST TECHNIQUE: Contiguous axial images were obtained from the base of the skull through the vertex without intravenous contrast. RADIATION DOSE REDUCTION: This exam was performed according to the departmental dose-optimization program which includes automated exposure control, adjustment of the mA and/or kV according to patient size and/or use of iterative reconstruction technique. COMPARISON:  07/23/2005. FINDINGS: Brain: No acute intracranial hemorrhage or midline shift. There is a effacement  of the basilar cisterns which is new from the previous exam. No discrete mass is identified. No extra-axial fluid collection. Gray-white matter differentiation is within normal limits. No hydrocephalus. The fourth ventricle is smaller as compared with the previous exam. Vascular: Not well seen.  No hyperdense vessel. Skull: Normal. Negative for fracture or focal lesion. Sinuses/Orbits: No acute finding. Other: None. IMPRESSION: Effacement of the basilar cisterns and decreased size of the fourth ventricle which is new from the prior exam and indeterminate in etiology. No discrete mass is identified. MRI with and without contrast is recommended for further evaluation. Electronically Signed   By: Thornell Sartorius M.D.   On: 12/20/2021 21:35    Procedures Procedures    Medications Ordered in ED Medications  hydrOXYzine (ATARAX) tablet 10 mg ( Oral MAR Unhold 12/21/21 2131)  venlafaxine XR (EFFEXOR-XR) 24 hr capsule 37.5 mg ( Oral MAR Unhold 12/21/21 2131)  albuterol (PROVENTIL) (2.5 MG/3ML) 0.083% nebulizer solution 2.5 mg ( Inhalation MAR Unhold 12/21/21 2131)   stroke: early stages of recovery book ( Does not apply MAR Unhold 12/21/21 2131)  0.9 %  sodium chloride infusion ( Intravenous New Bag/Given 12/21/21 2245)   acetaminophen (TYLENOL) tablet 650 mg ( Oral MAR Unhold 12/21/21 2131)    Or  acetaminophen (TYLENOL) 160 MG/5ML solution 650 mg ( Per Tube MAR Unhold 12/21/21 2131)    Or  acetaminophen (TYLENOL) suppository 650 mg ( Rectal MAR Unhold 12/21/21 2131)  docusate sodium (COLACE) capsule 100 mg (100 mg Oral Given 12/21/21 2242)  ondansetron (ZOFRAN-ODT) disintegrating tablet 4 mg ( Oral MAR Unhold 12/21/21 2131)    Or  ondansetron (ZOFRAN) injection 4 mg ( Intravenous MAR Unhold 12/21/21 2131)  pantoprazole (PROTONIX) EC tablet 40 mg ( Oral MAR Unhold 12/21/21 2131)    Or  pantoprazole (PROTONIX) 2 mg/mL oral suspension 40 mg ( Per Tube MAR Unhold 12/21/21 2131)  Chlorhexidine Gluconate Cloth 2 % PADS 6 each ( Topical MAR Unhold 12/21/21 2131)  HYDROcodone-acetaminophen (NORCO/VICODIN) 5-325 MG per tablet 1-2 tablet (2 tablets Oral Given 12/21/21 2242)  niMODipine (NIMOTOP) capsule 30 mg (30 mg Oral Given 12/21/21 2241)  diphenhydrAMINE (BENADRYL) injection 25 mg (25 mg Intravenous Given 12/20/21 2053)  sodium chloride 0.9 % bolus 1,000 mL (0 mLs Intravenous Stopped 12/21/21 0316)  metoCLOPramide (REGLAN) injection 10 mg (10 mg Intravenous Given 12/20/21 2053)  gadopiclenol (VUEWAY) 0.5 MMOL/ML solution 8 mL (8 mLs Intravenous Contrast Given 12/21/21 0004)  fentaNYL (SUBLIMAZE) injection 50 mcg (50 mcg Intravenous Given 12/21/21 0811)  ondansetron (ZOFRAN) injection 4 mg ( Intravenous Duplicate 12/21/21 1409)  chlorhexidine (PERIDEX) 0.12 % solution 15 mL (15 mLs Mouth/Throat Given 12/21/21 1748)    Or  Oral care mouth rinse ( Mouth Rinse See Alternative 12/21/21 1748)  ondansetron (ZOFRAN) injection 4 mg (4 mg Intravenous Given 12/21/21 2003)  fentaNYL (SUBLIMAZE) 100 MCG/2ML injection (  Override pull for Anesthesia 12/21/21 1909)  iohexol (OMNIPAQUE) 300 MG/ML solution 100 mL (35 mLs Intra-arterial Contrast Given 12/21/21 2027)  iohexol (OMNIPAQUE) 300 MG/ML solution 100 mL (80 mLs Intra-arterial Contrast  Given 12/21/21 2026)    ED Course/ Medical Decision Making/ A&P                           Medical Decision Making Amount and/or Complexity of Data Reviewed Labs: ordered. Radiology: ordered.  Risk Prescription drug management. Decision regarding hospitalization.   This patient presents to the ED for concern of LOC, this involves a number  of treatment options, and is a complaint that carries with it a high risk of complications and morbidity.  The differential diagnosis includes sub   Co morbidities: Discussed in HPI   Brief History:  Here with sudden onset of migraine while she was braiding hair in the kitchen.  Endorsed a severe headache to the frontal aspect.  Was lowered to the ground by daughter, daughter reports she had an episode where she can stared up into space  EMR reviewed including pt PMHx, past surgical history and past visits to ER.   See HPI for more details   Lab Tests:  I ordered and independently interpreted labs.  The pertinent results include:    Labs notable for leukocytosis of 16.2  which is significant elevated from her previous, however these results are so much older. CMP slight decrease in potassium, creatine level is normal. LFTs within normal limits.  Beta hCG is negative on today's visit.  UA is currently pending.   Imaging Studies:  CT Head showed: Effacement of the basilar cisterns and decreased size of the fourth  ventricle which is new from the prior exam and indeterminate in  etiology. No discrete mass is identified. MRI with and without  contrast is recommended for further evaluation.    Cardiac Monitoring:  The patient was maintained on a cardiac monitor.  I personally viewed and interpreted the cardiac monitored which showed an underlying rhythm of: NSR 71 EKG non-ischemic   Medicines ordered:  I ordered medication including reglan and benadryl  for head pain Reevaluation of the patient after these medicines showed that the  patient improved I have reviewed the patients home medicines and have made adjustments as needed   Critical Interventions:  Case discussed with neurology, patient has an MRI brain with and without along with MRA also order after abnormal CT.   Consults:  I requested consultation with Dr. Amada Jupiter of neurology,  and discussed lab and imaging findings as well as pertinent plan - they recommend: MRI Brain with and without along with MRA. Considered CTA however patient does have a contrast allergy and this would delay diagnosis.    Reevaluation:  After the interventions noted above I re-evaluated patient and found that they have :stayed the same   Social Determinants of Health:  The patient's social determinants of health were a factor in the care of this patient    Problem List / ED Course:  Patient arrives s/p LOC episode while at her kitchen braiding hair, reports severe sudden onset of pain along her forehead.  Reported prior history of headaches however was not previously diagnosed with migraines, does not take any medication for improvement in her symptoms.  Arrived to the ED in stable condition.  Did take Valium x2 prior to arrival as she has had some increase in stress lately according to family member at the bedside.  She is neurological exam is overall reassuring, however she is putting very little effort to move upper and lower extremities.  There is no obvious facial asymmetry, no dysarthria, no gait is noted to upper or lower extremities. Labs on today's visit remarkable for leukocytosis of 16.2, she denies any fever, no neck rigidity, no meningeal signs to suggest infection.  CMP with slight decrease in her potassium, creatinine levels within normal limits.  LFTs are unremarkable.  She received a headache cocktail with again along with Benadryl and bolus. Reassessed by me and my attending, continues to have very little effort with exam, however no abnormalities  were noted.  There is no hx of seizures, there was no tonic clonic movement during this episode. She is hemodynamically stable. Neurology recommends MRI/MRA.    Dispostion:  Patient awaiting MRI Brain/ MRA to r/o mass. Patient care signed out to incoming team pending MRI results, will need to follow up with neurology.     Portions of this note were generated with Scientist, clinical (histocompatibility and immunogenetics). Dictation errors may occur despite best attempts at proofreading.  Final Clinical Impression(s) / ED Diagnoses Final diagnoses:  Near syncope    Rx / DC Orders ED Discharge Orders     None         Claude Manges, PA-C 12/21/21 2352    Virgina Norfolk, DO 12/22/21 1154

## 2021-12-20 NOTE — ED Provider Notes (Signed)
Shared visit.  History, physical, medical decision making I have provided.  Patient here after episode of loss of consciousness.  History of asthma and depression.  She was in the kitchen braiding hair when she started to not feel well.  She started develop a headache and felt like she is in a pass out.  She is not sure if she hit her head or fully lost consciousness.  She had a frontal headache since the fall.  She has been having headaches here more recently.  Denies any vision changes, weakness, numbness, speech changes.  She has been under a lot of stress recently.  She took some Valium prior to this event happening.  She denies any chest pain or shortness of breath.  She has normal vitals.  No fever.  Neurologic exam is overall difficult to obtain due to some effort dependent issues.  She appears to have equal strength and sensation throughout.  Visual fields appear to be intact.  Slightly disconjugate gaze on exam.  Appears have good normal to finger-to-nose finger.  Speech is normal.  She has no nuchal rigidity.  She has normal range of motion of the neck without any issues.  Differential diagnosis is possibly migraine related process versus stress related process versus less likely head bleed/brain mass.  I have very low suspicion for stroke.  We will get CBC, BMP, CT scan of the head.  Per my review and interpretation of labs she has mild white count elevation of 16 but otherwise no significant anemia or electrolyte abnormality.  CT scan per radiology report shows effacement of the basilar cisterns and decreased size of the fourth ventricle which is new from prior exam and indeterminate in etiology.  There is no bleed or obvious mass.  We have talked with Dr. Leonel Ramsay with neurology who recommends MRI with and without contrast of the brain as well as an MRA head and neck.  Patient to be handed off to oncoming ED staff.  Please see PA note for further results, evaluation, disposition of the  patient.  This chart was dictated using voice recognition software.  Despite best efforts to proofread,  errors can occur which can change the documentation meaning.     Lennice Sites, DO 12/20/21 2245

## 2021-12-21 ENCOUNTER — Inpatient Hospital Stay (HOSPITAL_COMMUNITY): Payer: BC Managed Care – PPO | Admitting: Anesthesiology

## 2021-12-21 ENCOUNTER — Inpatient Hospital Stay (HOSPITAL_COMMUNITY): Payer: BC Managed Care – PPO

## 2021-12-21 ENCOUNTER — Other Ambulatory Visit: Payer: Self-pay

## 2021-12-21 ENCOUNTER — Encounter (HOSPITAL_COMMUNITY)
Admission: EM | Disposition: A | Discharge status: Home / Self Care | Payer: Self-pay | Source: Home / Self Care | Attending: Neurosurgery

## 2021-12-21 ENCOUNTER — Encounter (HOSPITAL_COMMUNITY): Payer: Self-pay | Admitting: Neurological Surgery

## 2021-12-21 DIAGNOSIS — F17210 Nicotine dependence, cigarettes, uncomplicated: Secondary | ICD-10-CM | POA: Diagnosis present

## 2021-12-21 DIAGNOSIS — I608 Other nontraumatic subarachnoid hemorrhage: Secondary | ICD-10-CM | POA: Diagnosis present

## 2021-12-21 DIAGNOSIS — F32A Depression, unspecified: Secondary | ICD-10-CM | POA: Diagnosis present

## 2021-12-21 DIAGNOSIS — Z880 Allergy status to penicillin: Secondary | ICD-10-CM | POA: Diagnosis not present

## 2021-12-21 DIAGNOSIS — G43909 Migraine, unspecified, not intractable, without status migrainosus: Secondary | ICD-10-CM | POA: Diagnosis present

## 2021-12-21 DIAGNOSIS — Z79899 Other long term (current) drug therapy: Secondary | ICD-10-CM | POA: Diagnosis not present

## 2021-12-21 DIAGNOSIS — I609 Nontraumatic subarachnoid hemorrhage, unspecified: Secondary | ICD-10-CM | POA: Diagnosis not present

## 2021-12-21 DIAGNOSIS — Z91040 Latex allergy status: Secondary | ICD-10-CM | POA: Diagnosis not present

## 2021-12-21 DIAGNOSIS — M436 Torticollis: Secondary | ICD-10-CM | POA: Diagnosis present

## 2021-12-21 DIAGNOSIS — Z882 Allergy status to sulfonamides status: Secondary | ICD-10-CM | POA: Diagnosis not present

## 2021-12-21 DIAGNOSIS — J45909 Unspecified asthma, uncomplicated: Secondary | ICD-10-CM | POA: Diagnosis present

## 2021-12-21 DIAGNOSIS — F419 Anxiety disorder, unspecified: Secondary | ICD-10-CM | POA: Diagnosis present

## 2021-12-21 DIAGNOSIS — Z91041 Radiographic dye allergy status: Secondary | ICD-10-CM | POA: Diagnosis not present

## 2021-12-21 DIAGNOSIS — Z833 Family history of diabetes mellitus: Secondary | ICD-10-CM | POA: Diagnosis not present

## 2021-12-21 DIAGNOSIS — I629 Nontraumatic intracranial hemorrhage, unspecified: Secondary | ICD-10-CM | POA: Diagnosis present

## 2021-12-21 DIAGNOSIS — F439 Reaction to severe stress, unspecified: Secondary | ICD-10-CM | POA: Diagnosis present

## 2021-12-21 DIAGNOSIS — I6031 Nontraumatic subarachnoid hemorrhage from right posterior communicating artery: Secondary | ICD-10-CM | POA: Diagnosis present

## 2021-12-21 DIAGNOSIS — Z8249 Family history of ischemic heart disease and other diseases of the circulatory system: Secondary | ICD-10-CM | POA: Diagnosis not present

## 2021-12-21 HISTORY — PX: IR ANGIO VERTEBRAL SEL VERTEBRAL BILAT MOD SED: IMG5369

## 2021-12-21 HISTORY — PX: IR TRANSCATH/EMBOLIZ: IMG695

## 2021-12-21 HISTORY — PX: IR ANGIO INTRA EXTRACRAN SEL INTERNAL CAROTID UNI R MOD SED: IMG5362

## 2021-12-21 HISTORY — PX: IR ANGIOGRAM FOLLOW UP STUDY: IMG697

## 2021-12-21 HISTORY — PX: IR ANGIO INTRA EXTRACRAN SEL INTERNAL CAROTID UNI L MOD SED: IMG5361

## 2021-12-21 HISTORY — PX: RADIOLOGY WITH ANESTHESIA: SHX6223

## 2021-12-21 LAB — URINALYSIS, ROUTINE W REFLEX MICROSCOPIC
RBC / HPF: >50 RBC/hpf — ABNORMAL HIGH (ref 0–5)
WBC, UA: >50 WBC/hpf — ABNORMAL HIGH (ref 0–5)

## 2021-12-21 LAB — RAPID URINE DRUG SCREEN, HOSP PERFORMED
Amphetamines: NOT DETECTED
Barbiturates: NOT DETECTED
Benzodiazepines: POSITIVE — AB
Cocaine: NOT DETECTED
Opiates: NOT DETECTED
Tetrahydrocannabinol: POSITIVE — AB

## 2021-12-21 LAB — CBC
HCT: 32.3 % — ABNORMAL LOW (ref 36.0–46.0)
Hemoglobin: 10.7 g/dL — ABNORMAL LOW (ref 12.0–15.0)
MCH: 30.7 pg (ref 26.0–34.0)
MCHC: 33.1 g/dL (ref 30.0–36.0)
MCV: 92.8 fL (ref 80.0–100.0)
Platelets: 279 10*3/uL (ref 150–400)
RBC: 3.48 MIL/uL — ABNORMAL LOW (ref 3.87–5.11)
RDW: 16.6 % — ABNORMAL HIGH (ref 11.5–15.5)
WBC: 17 10*3/uL — ABNORMAL HIGH (ref 4.0–10.5)
nRBC: 0 % (ref 0.0–0.2)

## 2021-12-21 LAB — HIV ANTIBODY (ROUTINE TESTING W REFLEX): HIV Screen 4th Generation wRfx: NONREACTIVE

## 2021-12-21 LAB — GLUCOSE, CAPILLARY: Glucose-Capillary: 90 mg/dL (ref 70–99)

## 2021-12-21 LAB — MRSA NEXT GEN BY PCR, NASAL: MRSA by PCR Next Gen: NOT DETECTED

## 2021-12-21 LAB — APTT: aPTT: 28 seconds (ref 24–36)

## 2021-12-21 LAB — PROTIME-INR
INR: 1.1 (ref 0.8–1.2)
Prothrombin Time: 13.8 seconds (ref 11.4–15.2)

## 2021-12-21 SURGERY — IR WITH ANESTHESIA
Anesthesia: General

## 2021-12-21 MED ORDER — PROPOFOL 10 MG/ML IV BOLUS
INTRAVENOUS | Status: DC | PRN
  Administered 2021-12-21 (×2): 30 mg via INTRAVENOUS
  Administered 2021-12-21: 20 mg via INTRAVENOUS
  Administered 2021-12-21: 150 mg via INTRAVENOUS

## 2021-12-21 MED ORDER — LIDOCAINE 2% (20 MG/ML) 5 ML SYRINGE
INTRAMUSCULAR | Status: DC | PRN
  Administered 2021-12-21: 100 mg via INTRAVENOUS

## 2021-12-21 MED ORDER — NIMODIPINE 6 MG/ML PO SOLN
60.0000 mg | ORAL | Status: DC
  Filled 2021-12-21 (×2): qty 10

## 2021-12-21 MED ORDER — CHLORHEXIDINE GLUCONATE 0.12 % MT SOLN: 15.0000 mL | Freq: Once | OROMUCOSAL | Status: AC

## 2021-12-21 MED ORDER — VENLAFAXINE HCL ER 37.5 MG PO CP24
37.5000 mg | ORAL_CAPSULE | Freq: Every day | ORAL | Status: DC
  Administered 2021-12-21 – 2021-12-23 (×3): 37.5 mg via ORAL
  Filled 2021-12-21 (×4): qty 1

## 2021-12-21 MED ORDER — PANTOPRAZOLE SODIUM 40 MG PO TBEC
40.0000 mg | DELAYED_RELEASE_TABLET | Freq: Every day | ORAL | Status: DC
  Administered 2021-12-21 – 2021-12-23 (×3): 40 mg via ORAL
  Filled 2021-12-21 (×3): qty 1

## 2021-12-21 MED ORDER — ONDANSETRON HCL 4 MG/2ML IJ SOLN
4.0000 mg | Freq: Four times a day (QID) | INTRAMUSCULAR | Status: AC | PRN
  Administered 2021-12-21: 4 mg via INTRAVENOUS

## 2021-12-21 MED ORDER — NIMODIPINE 6 MG/ML PO SOLN
60.0000 mg | ORAL | Status: DC
  Filled 2021-12-21 (×4): qty 10

## 2021-12-21 MED ORDER — HYDROCODONE-ACETAMINOPHEN 5-325 MG PO TABS
1.0000 | ORAL_TABLET | Freq: Four times a day (QID) | ORAL | Status: DC | PRN
  Administered 2021-12-21 (×2): 2 via ORAL
  Administered 2021-12-22: 1 via ORAL
  Administered 2021-12-22: 2 via ORAL
  Administered 2021-12-22: 1 via ORAL
  Administered 2021-12-22: 2 via ORAL
  Administered 2021-12-23: 1 via ORAL
  Administered 2021-12-23: 2 via ORAL
  Filled 2021-12-21: qty 1
  Filled 2021-12-21 (×6): qty 2
  Filled 2021-12-21: qty 1

## 2021-12-21 MED ORDER — NIMODIPINE 30 MG PO CAPS
30.0000 mg | ORAL_CAPSULE | ORAL | Status: DC
  Administered 2021-12-21 – 2021-12-22 (×5): 30 mg via ORAL
  Filled 2021-12-21 (×11): qty 1

## 2021-12-21 MED ORDER — NIMODIPINE 30 MG PO CAPS
60.0000 mg | ORAL_CAPSULE | ORAL | Status: DC
  Administered 2021-12-21: 60 mg via ORAL
  Filled 2021-12-21 (×4): qty 2

## 2021-12-21 MED ORDER — IOHEXOL 300 MG/ML  SOLN
100.0000 mL | Freq: Once | INTRAMUSCULAR | Status: AC | PRN
  Administered 2021-12-21: 80 mL via INTRA_ARTERIAL

## 2021-12-21 MED ORDER — NIMODIPINE 30 MG PO CAPS
60.0000 mg | ORAL_CAPSULE | ORAL | Status: DC
  Filled 2021-12-21 (×2): qty 2

## 2021-12-21 MED ORDER — FENTANYL CITRATE (PF) 100 MCG/2ML IJ SOLN: 25.0000 ug | INTRAMUSCULAR | Status: DC | PRN

## 2021-12-21 MED ORDER — SODIUM CHLORIDE 0.9 % IV SOLN: INTRAVENOUS | Status: DC

## 2021-12-21 MED ORDER — ONDANSETRON HCL 4 MG/2ML IJ SOLN
INTRAMUSCULAR | Status: AC
  Filled 2021-12-21: qty 2

## 2021-12-21 MED ORDER — ACETAMINOPHEN 325 MG PO TABS
650.0000 mg | ORAL_TABLET | ORAL | Status: DC | PRN
  Administered 2021-12-22 – 2021-12-23 (×2): 650 mg via ORAL
  Filled 2021-12-21 (×2): qty 2

## 2021-12-21 MED ORDER — FENTANYL CITRATE (PF) 250 MCG/5ML IJ SOLN
INTRAMUSCULAR | Status: DC | PRN
  Administered 2021-12-21: 100 ug via INTRAVENOUS

## 2021-12-21 MED ORDER — FENTANYL CITRATE PF 50 MCG/ML IJ SOSY
50.0000 ug | PREFILLED_SYRINGE | Freq: Once | INTRAMUSCULAR | Status: AC
  Administered 2021-12-21: 50 ug via INTRAVENOUS
  Filled 2021-12-21: qty 1

## 2021-12-21 MED ORDER — ORAL CARE MOUTH RINSE: 15.0000 mL | Freq: Once | OROMUCOSAL | Status: AC

## 2021-12-21 MED ORDER — ONDANSETRON HCL 4 MG/2ML IJ SOLN
4.0000 mg | Freq: Once | INTRAMUSCULAR | Status: AC
  Administered 2021-12-21: 4 mg via INTRAVENOUS

## 2021-12-21 MED ORDER — STROKE: EARLY STAGES OF RECOVERY BOOK
Freq: Once | Status: AC
  Filled 2021-12-21 (×2): qty 1

## 2021-12-21 MED ORDER — CHLORHEXIDINE GLUCONATE 0.12 % MT SOLN
OROMUCOSAL | Status: AC
  Administered 2021-12-21: 15 mL via OROMUCOSAL
  Filled 2021-12-21: qty 15

## 2021-12-21 MED ORDER — ONDANSETRON 4 MG PO TBDP: 4.0000 mg | ORAL_TABLET | Freq: Four times a day (QID) | ORAL | Status: DC | PRN

## 2021-12-21 MED ORDER — FENTANYL CITRATE (PF) 100 MCG/2ML IJ SOLN
INTRAMUSCULAR | Status: AC
  Filled 2021-12-21: qty 2

## 2021-12-21 MED ORDER — PANTOPRAZOLE 2 MG/ML SUSPENSION
40.0000 mg | Freq: Every day | ORAL | Status: DC
  Filled 2021-12-21: qty 20

## 2021-12-21 MED ORDER — OXYCODONE HCL 5 MG/5ML PO SOLN: 5.0000 mg | Freq: Once | ORAL | Status: DC | PRN

## 2021-12-21 MED ORDER — ACETAMINOPHEN 650 MG RE SUPP: 650.0000 mg | RECTAL | Status: DC | PRN

## 2021-12-21 MED ORDER — CHLORHEXIDINE GLUCONATE CLOTH 2 % EX PADS
6.0000 | MEDICATED_PAD | Freq: Every day | CUTANEOUS | Status: DC
  Administered 2021-12-22 – 2021-12-23 (×2): 6 via TOPICAL

## 2021-12-21 MED ORDER — ALBUTEROL SULFATE (2.5 MG/3ML) 0.083% IN NEBU: 2.5000 mg | INHALATION_SOLUTION | RESPIRATORY_TRACT | Status: DC | PRN

## 2021-12-21 MED ORDER — OXYCODONE HCL 5 MG PO TABS: 5.0000 mg | ORAL_TABLET | Freq: Once | ORAL | Status: DC | PRN

## 2021-12-21 MED ORDER — ONDANSETRON HCL 4 MG/2ML IJ SOLN
4.0000 mg | Freq: Four times a day (QID) | INTRAMUSCULAR | Status: DC | PRN
  Administered 2021-12-21 – 2021-12-23 (×5): 4 mg via INTRAVENOUS
  Filled 2021-12-21 (×5): qty 2

## 2021-12-21 MED ORDER — ACETAMINOPHEN 160 MG/5ML PO SOLN: 650.0000 mg | ORAL | Status: DC | PRN

## 2021-12-21 MED ORDER — ROCURONIUM BROMIDE 10 MG/ML (PF) SYRINGE
PREFILLED_SYRINGE | INTRAVENOUS | Status: DC | PRN
  Administered 2021-12-21: 70 mg via INTRAVENOUS

## 2021-12-21 MED ORDER — DOCUSATE SODIUM 100 MG PO CAPS
100.0000 mg | ORAL_CAPSULE | Freq: Two times a day (BID) | ORAL | Status: DC
  Administered 2021-12-21 – 2021-12-23 (×5): 100 mg via ORAL
  Filled 2021-12-21 (×5): qty 1

## 2021-12-21 MED ORDER — HYDROXYZINE HCL 10 MG PO TABS
10.0000 mg | ORAL_TABLET | Freq: Three times a day (TID) | ORAL | Status: DC | PRN
  Administered 2021-12-22: 10 mg via ORAL
  Filled 2021-12-21 (×2): qty 1

## 2021-12-21 MED ORDER — SUGAMMADEX SODIUM 200 MG/2ML IV SOLN
INTRAVENOUS | Status: DC | PRN
  Administered 2021-12-21: 400 mg via INTRAVENOUS

## 2021-12-21 MED ORDER — IOHEXOL 300 MG/ML  SOLN
100.0000 mL | Freq: Once | INTRAMUSCULAR | Status: AC | PRN
  Administered 2021-12-21: 35 mL via INTRA_ARTERIAL

## 2021-12-21 NOTE — Anesthesia Preprocedure Evaluation (Signed)
Anesthesia Evaluation  Patient identified by MRN, date of birth, ID band Patient awake    Reviewed: Allergy & Precautions, H&P , NPO status , Patient's Chart, lab work & pertinent test results  Airway Mallampati: II   Neck ROM: full    Dental   Pulmonary asthma , Current Smoker,    breath sounds clear to auscultation       Cardiovascular negative cardio ROS   Rhythm:regular Rate:Normal     Neuro/Psych PSYCHIATRIC DISORDERS Anxiety Depression Subarachnoid hemorrhage    GI/Hepatic   Endo/Other    Renal/GU      Musculoskeletal   Abdominal   Peds  Hematology  (+) Blood dyscrasia, anemia ,   Anesthesia Other Findings   Reproductive/Obstetrics                             Anesthesia Physical Anesthesia Plan  ASA: 3  Anesthesia Plan: General   Post-op Pain Management:    Induction: Intravenous  PONV Risk Score and Plan: 2 and Ondansetron, Dexamethasone and Treatment may vary due to age or medical condition  Airway Management Planned: Oral ETT  Additional Equipment:   Intra-op Plan:   Post-operative Plan: Extubation in OR  Informed Consent: I have reviewed the patients History and Physical, chart, labs and discussed the procedure including the risks, benefits and alternatives for the proposed anesthesia with the patient or authorized representative who has indicated his/her understanding and acceptance.     Dental advisory given  Plan Discussed with: CRNA, Anesthesiologist and Surgeon  Anesthesia Plan Comments:         Anesthesia Quick Evaluation

## 2021-12-21 NOTE — Anesthesia Procedure Notes (Signed)
Procedure Name: Intubation Date/Time: 12/21/2021 6:53 PM  Performed by: Georgia Duff, CRNAPre-anesthesia Checklist: Patient identified, Emergency Drugs available, Suction available and Patient being monitored Patient Re-evaluated:Patient Re-evaluated prior to induction Oxygen Delivery Method: Circle System Utilized Preoxygenation: Pre-oxygenation with 100% oxygen Induction Type: IV induction Ventilation: Mask ventilation without difficulty Laryngoscope Size: Miller Grade View: Grade I Tube type: Oral Tube size: 7.0 mm Number of attempts: 1 Airway Equipment and Method: Stylet and Oral airway Placement Confirmation: ETT inserted through vocal cords under direct vision, positive ETCO2 and breath sounds checked- equal and bilateral Secured at: 21 cm Tube secured with: Tape Dental Injury: Teeth and Oropharynx as per pre-operative assessment

## 2021-12-21 NOTE — Brief Op Note (Signed)
  NEUROSURGERY BRIEF OPERATIVE  NOTE   PREOP DX: Subarachnoid Hemorrhage  POSTOP DX: Same  PROCEDURE: Diagnostic cerebral angiogram, coil embolization of right Pcom aneurysm  SURGEON: Dr. Consuella Lose, MD  ANESTHESIA: GETA  APPROACH: Right trans-femoral  EBL: Minimal  SPECIMENS: None  COMPLICATIONS: None  CONDITION: Stable to recovery  FINDINGS (Full report in [facility]): 1. 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 No intracranial vasospasm    Consuella Lose, MD [facility]

## 2021-12-21 NOTE — Progress Notes (Signed)
  NEUROSURGERY PROGRESS NOTE   History reviewed with Dr. Zada Finders and with pt/sister. Briefly, patient initially had onset of severe headache about 4 weeks ago while she was at the park with her son.  At that point she was describing more posterior headaches.  She took some over-the-counter medication and the headache actually improved over the course of a day or 2.  Unfortunately, she had sudden onset of severe, worst headache of life yesterday.  She describes primarily frontal headaches at that time.  She also describes some photophobia.  No associated numbness tingling or weakness of the extremities.  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  Of note, the patient does not report any history of hypertension, diabetes, heart disease, or stroke.  No known liver disease or kidney disease.  She is a 1 pack/day smoker.  Her father died of a brain aneurysm about 7 years ago.  I did asked the patient about a documented contrast allergy however the patient does not report any known iodinated contrast allergy.  She does report a shellfish allergy and latex allergy.  EXAM:  BP 110/77   Pulse 78   Temp 98.6 F (37 C) (Oral)   Resp 17   Ht 5\' 1"  (1.549 m)   Wt 78.9 kg   SpO2 100%   BMI 32.88 kg/m   Awake, alert, oriented  Speech fluent, appropriate  CN grossly intact  5/5 BUE/BLE   IMAGING: MRI of the brain as well as MRA of the head were both personally reviewed.  The MRI reveals susceptibility artifact within the CP angle and prepontine cistern suggesting chronic subarachnoid hemorrhage.  There does also appear to be some susceptibility artifact within the occipital horns again suggesting chronic hemorrhage.  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  There also may be a small left-sided ophthalmic aneurysm as well.  IMPRESSION:  36 y.o. female presenting with headache with similar headache several weeks ago.  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  I therefore think it prudent to treat her as a subacute subarachnoid hemorrhage presentation, Hunt Hess 2.  PLAN: -We will plan on proceeding with diagnostic cerebral angiogram and likely treatment of identified aneurysms. -Continue observation in the intensive care unit -SBP goal less than 140 mmHg -Continue Nimotop 30 mg every 2 hours  I have reviewed the imaging findings with the patient and her sister at bedside.  I told him that given the presence of the hemorrhage and intracranial aneurysms, I think it safest to treat her as though the aneurysms have ruptured.  I therefore think relatively urgent angiogram and treatment of the aneurysms is reasonable.  I did review with them the details of the endovascular procedure including the diagnostic angiogram and likely coil embolization.  We discussed the expected postoperative course and recovery.  I did also review with them the risks associated with the endovascular procedure to include risk of stroke leading to weakness, numbness, paralysis, coma, death, arterial dissection, contrast nephropathy, and groin hematoma.  We did also discuss general risks of anesthesia to include heart attack, stroke, pneumonia, and blood clots.  All her questions were answered and she provided consent to proceed with angiogram and possible aneurysm coiling.  Consuella Lose, MD [facility]

## 2021-12-21 NOTE — Anesthesia Postprocedure Evaluation (Signed)
Anesthesia Post Note  Patient: Kerry Chavez  Procedure(s) Performed: IR WITH ANESTHESIA     Patient location during evaluation: PACU Anesthesia Type: General Level of consciousness: awake and alert Pain management: pain level controlled Vital Signs Assessment: post-procedure vital signs reviewed and stable Respiratory status: spontaneous breathing, nonlabored ventilation, respiratory function stable and patient connected to nasal cannula oxygen Cardiovascular status: blood pressure returned to baseline and stable Postop Assessment: no apparent nausea or vomiting Anesthetic complications: no   No notable events documented.  Last Vitals:  Vitals:   12/21/21 2200 12/21/21 2215  BP: 117/75 117/83  Pulse: 74 79  Resp: 17 15  Temp:    SpO2: 95% 96%    Last Pain:  Vitals:   12/21/21 2130  TempSrc:   PainSc: 0-No pain                 Tiajuana Amass

## 2021-12-21 NOTE — Anesthesia Procedure Notes (Signed)
Arterial Line Insertion Start/End9/28/2023 6:50 PM, 12/21/2021 7:00 PM  Patient location: Pre-op. Preanesthetic checklist: patient identified, IV checked and anesthesia consent Lidocaine 1% used for infiltration radial was placed Catheter size: 20 G Hand hygiene performed  and maximum sterile barriers used   Attempts: 1 Procedure performed without using ultrasound guided technique. Following insertion, dressing applied. Post procedure assessment: normal and unchanged  Patient tolerated the procedure well with no immediate complications.

## 2021-12-21 NOTE — Transfer of Care (Signed)
Immediate Anesthesia Transfer of Care Note  Patient: Kerry Chavez  Procedure(s) Performed: IR WITH ANESTHESIA  Patient Location: PACU  Anesthesia Type:General  Level of Consciousness: drowsy  Airway & Oxygen Therapy: Patient Spontanous Breathing  Post-op Assessment: Report given to RN and Post -op Vital signs reviewed and stable  Post vital signs: Reviewed and stable  Last Vitals:  Vitals Value Taken Time  BP 108/64 12/21/21 2030  Temp    Pulse 73 12/21/21 2033  Resp 17 12/21/21 2033  SpO2 95 % 12/21/21 2033  Vitals shown include unvalidated device data.  Last Pain:  Vitals:   12/21/21 1743  TempSrc: Axillary  PainSc: 7          Complications: No notable events documented.

## 2021-12-21 NOTE — ED Provider Notes (Signed)
Assumed care at shift change.  See prior notes for full H&P.  Briefly, 36 y.o. 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  Apparently, developed headache, grabbed head, and fell to the ground.  Brief LOC.  Did take valium at home given hx of anxiety.  Initial labs done, CT head with ocncern for possible mass.  Discussed with neurology for MRI recommendations and results are pending.  Plan:  MRI's pending.  Will follow-up on results, consult as needed.  Results for orders placed or performed during the hospital encounter of 12/20/21  CBC with Differential  Result Value Ref Range   WBC 16.2 (H) 4.0 - 10.5 K/uL   RBC 3.48 (L) 3.87 - 5.11 MIL/uL   Hemoglobin 10.9 (L) 12.0 - 15.0 g/dL   HCT 92.1 (L) 19.4 - 17.4 %   MCV 93.4 80.0 - 100.0 fL   MCH 31.3 26.0 - 34.0 pg   MCHC 33.5 30.0 - 36.0 g/dL   RDW 08.1 (H) 44.8 - 18.5 %   Platelets 258 150 - 400 K/uL   nRBC 0.0 0.0 - 0.2 %   Neutrophils Relative % 63 %   Neutro Abs 10.1 (H) 1.7 - 7.7 K/uL   Lymphocytes Relative 28 %   Lymphs Abs 4.6 (H) 0.7 - 4.0 K/uL   Monocytes Relative 6 %   Monocytes Absolute 1.0 0.1 - 1.0 K/uL   Eosinophils Relative 2 %   Eosinophils Absolute 0.3 0.0 - 0.5 K/uL   Basophils Relative 0 %   Basophils Absolute 0.1 0.0 - 0.1 K/uL   Immature Granulocytes 1 %   Abs Immature Granulocytes 0.08 (H) 0.00 - 0.07 K/uL  Comprehensive metabolic panel  Result Value Ref Range   Sodium 138 135 - 145 mmol/L   Potassium 3.3 (L) 3.5 - 5.1 mmol/L   Chloride 110 98 - 111 mmol/L   CO2 20 (L) 22 - 32 mmol/L   Glucose, Bld 119 (H) 70 - 99 mg/dL   BUN 9 6 - 20 mg/dL   Creatinine, Ser 6.31 0.44 - 1.00 mg/dL   Calcium 8.5 (L) 8.9 - 10.3 mg/dL   Total Protein 6.2 (L) 6.5 - 8.1 g/dL   Albumin 3.4 (L) 3.5 - 5.0 g/dL   AST 20 15 - 41 U/L   ALT 18 0 - 44 U/L   Alkaline Phosphatase 63 38 - 126 U/L   Total Bilirubin 0.3 0.3 - 1.2 mg/dL   GFR, Estimated >49 >70 mL/min   Anion gap 8 5 - 15  Ethanol  Result Value Ref Range    Alcohol, Ethyl (B) <10 <10 mg/dL  I-Stat Beta hCG blood, ED ([facility], [facility], [facility] only)  Result Value Ref Range   I-stat hCG, quantitative <5.0 <5 mIU/mL   Comment 3           MR BRAIN W WO CONTRAST  Result Date: 12/21/2021 CLINICAL DATA:  Headache, dizziness, syncopal episode EXAM: MRI HEAD WITHOUT AND WITH CONTRAST MRA HEAD WITHOUT CONTRAST MRA NECK WITHOUT AND WITH CONTRAST TECHNIQUE: Multiplanar, multi-echo pulse sequences of the brain and surrounding structures were acquired without and with intravenous contrast. Angiographic images of the Circle of Willis were acquired using MRA technique without intravenous contrast. Angiographic images of the neck were acquired using MRA technique without and with intravenous contrast. Carotid stenosis measurements (when applicable) are obtained utilizing NASCET criteria, using the distal internal carotid diameter as the denominator. CONTRAST:  8 mL Gadavist COMPARISON:  No prior MRI, correlation is made  with CT head 12/20/2021 FINDINGS: MRI HEAD FINDINGS Brain: Possible minimally enhancing material in the interpeduncular and ambient cisterns (series 22, image 21 and series 20, image 21), which does not suppress on FLAIR (series 15, image 11). Similar FLAIR hyperintense material is noted in the fourth ventricle and possibly along the cerebellopontine angles (series 15, image 8), although the cerebellopontine angles are often associated with incomplete suppression of FLAIR. The basal cisterns and fourth ventricle are also associated with susceptibility (series 18, image 14 and 20), with additional susceptibility in the left occipital horn and third ventricle (series 18, image 25). No restricted diffusion to suggest acute or subacute infarct. No mass, mass effect, or midline shift. No restricted Vascular: Please see MRA findings below. Skull and upper cervical spine: Normal marrow signal. Sinuses/Orbits: No acute finding. Other: The mastoids are well aerated. MRA HEAD  FINDINGS Anterior circulation: Both internal carotid arteries are patent to the termini, without significant stenosis. 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 At the right ICA terminus, there is a 7 x 6 x 4 mm outpouching (series 1, image 108 and series 1034, image 176), also likely an aneurysm. 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 A1 segments patent. Normal anterior communicating artery. Anterior cerebral arteries are patent to their distal aspects. No M1 stenosis or occlusion. Normal MCA bifurcations. Distal MCA branches perfused and symmetric. Posterior circulation: Vertebral arteries patent to the vertebrobasilar junction without stenosis. Posterior inferior cerebral arteries patent bilaterally. Basilar patent to its distal aspect. Superior cerebellar arteries patent bilaterally. Patent P1 segments. PCAs perfused to their distal aspects without stenosis. The bilateral posterior communicating arteries are not visualized. Anatomic variants: None significant MRA NECK FINDINGS Aortic arch: Standard branching. Imaged portion shows no evidence of aneurysm or dissection. No significant stenosis of the major arch vessel origins. Right carotid system: No evidence of stenosis, dissection, or occlusion. Left carotid system: No evidence of stenosis, dissection, or occlusion. Vertebral arteries: No evidence of stenosis, dissection, or occlusion. Other: None IMPRESSION: 1. Minimally enhancing material in the interpeduncular and ambient cisterns, with T2 hyperintense material that does not suppress on FLAIR in these locations, as well as along the cerebellopontine angles and in the fourth ventricle. Some of this material likely represents hemorrhage, given associated susceptibility, with an additional small amount of hemorrhage in the left occipital horn  and third ventricle. These findings are overall concerning for subarachnoid hemorrhage of indeterminate etiology, but these findings could also be seen with meningitis, neurosarcoidosis, or metastatic disease, although these are felt to be less likely. 2. 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 Additional smaller aneurysms in the proximal supraclinoid ICA bilaterally, measuring up to 4 mm on the left and 2 mm on the right. 3. No intracranial large vessel occlusion or significant stenosis. 4. No hemodynamically significant stenosis in the neck. These results were called by telephone at the time of interpretation on 12/21/2021 at 1:18 am to provider [facility] , who verbally acknowledged these results. Electronically Signed   By: Wiliam Ke M.D.   On: 12/21/2021 01:20   MR Angiogram Neck W or Wo Contrast  Result Date: 12/21/2021 CLINICAL DATA:  Headache, dizziness, syncopal episode EXAM: MRI HEAD WITHOUT AND WITH CONTRAST MRA HEAD WITHOUT CONTRAST MRA NECK WITHOUT AND WITH CONTRAST TECHNIQUE: Multiplanar, multi-echo pulse sequences  of the brain and surrounding structures were acquired without and with intravenous contrast. Angiographic images of the Circle of Willis were acquired using MRA technique without intravenous contrast. Angiographic images of the neck were acquired using MRA technique without and with intravenous contrast. Carotid stenosis measurements (when applicable) are obtained utilizing NASCET criteria, using the distal internal carotid diameter as the denominator. CONTRAST:  8 mL Gadavist COMPARISON:  No prior MRI, correlation is made with CT head 12/20/2021 FINDINGS: MRI HEAD FINDINGS Brain: Possible minimally enhancing material in the interpeduncular and ambient cisterns (series 22, image 21 and series 20, image 21), which does not suppress on FLAIR (series 15, image 11). Similar FLAIR hyperintense material is noted in the fourth ventricle and possibly along  the cerebellopontine angles (series 15, image 8), although the cerebellopontine angles are often associated with incomplete suppression of FLAIR. The basal cisterns and fourth ventricle are also associated with susceptibility (series 18, image 14 and 20), with additional susceptibility in the left occipital horn and third ventricle (series 18, image 25). No restricted diffusion to suggest acute or subacute infarct. No mass, mass effect, or midline shift. No restricted Vascular: Please see MRA findings below. Skull and upper cervical spine: Normal marrow signal. Sinuses/Orbits: No acute finding. Other: The mastoids are well aerated. MRA HEAD FINDINGS Anterior circulation: Both internal carotid arteries are patent to the termini, without significant stenosis. 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 At the right ICA terminus, there is a 7 x 6 x 4 mm outpouching (series 1, image 108 and series 1034, image 176), also likely an aneurysm. 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 A1 segments patent. Normal anterior communicating artery. Anterior cerebral arteries are patent to their distal aspects. No M1 stenosis or occlusion. Normal MCA bifurcations. Distal MCA branches perfused and symmetric. Posterior circulation: Vertebral arteries patent to the vertebrobasilar junction without stenosis. Posterior inferior cerebral arteries patent bilaterally. Basilar patent to its distal aspect. Superior cerebellar arteries patent bilaterally. Patent P1 segments. PCAs perfused to their distal aspects without stenosis. The bilateral posterior communicating arteries are not visualized. Anatomic variants: None significant MRA NECK FINDINGS Aortic arch: Standard branching. Imaged portion shows no evidence of aneurysm or dissection. No significant stenosis of the major arch  vessel origins. Right carotid system: No evidence of stenosis, dissection, or occlusion. Left carotid system: No evidence of stenosis, dissection, or occlusion. Vertebral arteries: No evidence of stenosis, dissection, or occlusion. Other: None IMPRESSION: 1. Minimally enhancing material in the interpeduncular and ambient cisterns, with T2 hyperintense material that does not suppress on FLAIR in these locations, as well as along the cerebellopontine angles and in the fourth ventricle. Some of this material likely represents hemorrhage, given associated susceptibility, with an additional small amount of hemorrhage in the left occipital horn and third ventricle. These findings are overall concerning for subarachnoid hemorrhage of indeterminate etiology, but these findings could also be seen with meningitis, neurosarcoidosis, or metastatic disease, although these are felt to be less likely. 2. 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 Additional smaller aneurysms in the proximal supraclinoid ICA bilaterally, measuring up to 4 mm on the left and 2 mm on the right. 3. No intracranial large vessel occlusion or significant stenosis. 4. No hemodynamically significant stenosis in the neck. These results were called by telephone  at the time of interpretation on 12/21/2021 at 1:18 am to provider [facility] , who verbally acknowledged these results. Electronically Signed   By: Merilyn Baba M.D.   On: 12/21/2021 01:20   MR ANGIO HEAD WO CONTRAST  Result Date: 12/21/2021 CLINICAL DATA:  Headache, dizziness, syncopal episode EXAM: MRI HEAD WITHOUT AND WITH CONTRAST MRA HEAD WITHOUT CONTRAST MRA NECK WITHOUT AND WITH CONTRAST TECHNIQUE: Multiplanar, multi-echo pulse sequences of the brain and surrounding structures were acquired without and with intravenous contrast. Angiographic images of the Circle of Willis were acquired using MRA technique without intravenous contrast. Angiographic images of  the neck were acquired using MRA technique without and with intravenous contrast. Carotid stenosis measurements (when applicable) are obtained utilizing NASCET criteria, using the distal internal carotid diameter as the denominator. CONTRAST:  8 mL Gadavist COMPARISON:  No prior MRI, correlation is made with CT head 12/20/2021 FINDINGS: MRI HEAD FINDINGS Brain: Possible minimally enhancing material in the interpeduncular and ambient cisterns (series 22, image 21 and series 20, image 21), which does not suppress on FLAIR (series 15, image 11). Similar FLAIR hyperintense material is noted in the fourth ventricle and possibly along the cerebellopontine angles (series 15, image 8), although the cerebellopontine angles are often associated with incomplete suppression of FLAIR. The basal cisterns and fourth ventricle are also associated with susceptibility (series 18, image 14 and 20), with additional susceptibility in the left occipital horn and third ventricle (series 18, image 25). No restricted diffusion to suggest acute or subacute infarct. No mass, mass effect, or midline shift. No restricted Vascular: Please see MRA findings below. Skull and upper cervical spine: Normal marrow signal. Sinuses/Orbits: No acute finding. Other: The mastoids are well aerated. MRA HEAD FINDINGS Anterior circulation: Both internal carotid arteries are patent to the termini, without significant stenosis. 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 At the right ICA terminus, there is a 7 x 6 x 4 mm outpouching (series 1, image 108 and series 1034, image 176), also likely an aneurysm. 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 A1 segments patent. Normal anterior communicating artery. Anterior cerebral arteries are patent to their distal aspects. No M1 stenosis or occlusion.  Normal MCA bifurcations. Distal MCA branches perfused and symmetric. Posterior circulation: Vertebral arteries patent to the vertebrobasilar junction without stenosis. Posterior inferior cerebral arteries patent bilaterally. Basilar patent to its distal aspect. Superior cerebellar arteries patent bilaterally. Patent P1 segments. PCAs perfused to their distal aspects without stenosis. The bilateral posterior communicating arteries are not visualized. Anatomic variants: None significant MRA NECK FINDINGS Aortic arch: Standard branching. Imaged portion shows no evidence of aneurysm or dissection. No significant stenosis of the major arch vessel origins. Right carotid system: No evidence of stenosis, dissection, or occlusion. Left carotid system: No evidence of stenosis, dissection, or occlusion. Vertebral arteries: No evidence of stenosis, dissection, or occlusion. Other: None IMPRESSION: 1. Minimally enhancing material in the interpeduncular and ambient cisterns, with T2 hyperintense material that does not suppress on FLAIR in these locations, as well as along the cerebellopontine angles and in the fourth ventricle. Some of this material likely represents hemorrhage, given associated susceptibility, with an additional small amount of hemorrhage in the left occipital horn and third ventricle. These findings are overall concerning for subarachnoid hemorrhage of indeterminate etiology, but these findings could also be seen with meningitis, neurosarcoidosis, or  metastatic disease, although these are felt to be less likely. 2. 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 Additional smaller aneurysms in the proximal supraclinoid ICA bilaterally, measuring up to 4 mm on the left and 2 mm on the right. 3. No intracranial large vessel occlusion or significant stenosis. 4. No hemodynamically significant stenosis in the neck. These results were called by telephone at the time of interpretation on  12/21/2021 at 1:18 am to provider [facility] , who verbally acknowledged these results. Electronically Signed   By: Wiliam Ke M.D.   On: 12/21/2021 01:20   CT HEAD WO CONTRAST ( )  Result Date: 12/20/2021 CLINICAL DATA:  Headache, new or worsening, positional. Photophobia and stiff neck. EXAM: CT HEAD WITHOUT CONTRAST TECHNIQUE: Contiguous axial images were obtained from the base of the skull through the vertex without intravenous contrast. RADIATION DOSE REDUCTION: This exam was performed according to the departmental dose-optimization program which includes automated exposure control, adjustment of the mA and/or kV according to patient size and/or use of iterative reconstruction technique. COMPARISON:  07/23/2005. FINDINGS: Brain: No acute intracranial hemorrhage or midline shift. There is a effacement of the basilar cisterns which is new from the previous exam. No discrete mass is identified. No extra-axial fluid collection. Gray-white matter differentiation is within normal limits. No hydrocephalus. The fourth ventricle is smaller as compared with the previous exam. Vascular: Not well seen.  No hyperdense vessel. Skull: Normal. Negative for fracture or focal lesion. Sinuses/Orbits: No acute finding. Other: None. IMPRESSION: Effacement of the basilar cisterns and decreased size of the fourth ventricle which is new from the prior exam and indeterminate in etiology. No discrete mass is identified. MRI with and without contrast is recommended for further evaluation. Electronically Signed   By: Thornell Sartorius M.D.   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  Questionable meningitis vs neurosarcoidosis vs metastatic disease but these felt to be less likely.  Also has at least 3 small aneurysms seen.  Will discuss with neurosurgery.  Discussed with Dr. Maurice Small-- he will admit to SDU for ongoing monitoring, full eval in the AM for recommendations regarding aneurysms seen.  Family has been  updated, they are agreeable with admission.  Patient resting at this time, no acute complaints.  VSS.  CRITICAL CARE Performed by: Garlon Hatchet   Total critical care time: 40 minutes  Critical care time was exclusive of separately billable procedures and treating other patients.  Critical care was necessary to treat or prevent imminent or life-threatening deterioration.  Critical care was time spent personally by me on the following activities: development of treatment plan with patient and/or surrogate as well as nursing, discussions with consultants, evaluation of patient's response to treatment, examination of patient, obtaining history from patient or surrogate, ordering and performing treatments and interventions, ordering and review of laboratory studies, ordering and review of radiographic studies, pulse oximetry and re-evaluation of patient's condition.    Garlon Hatchet, PA-C 12/21/21 Joan Flores    Zadie Rhine, MD 12/21/21 [phone]

## 2021-12-21 NOTE — H&P (Signed)
Neurosurgery H&P  CC: Headache  HPI: This is a 36 y.o. woman that presents with [facility], also endorses neck stiffness, had an episode of syncope. Has some mild headaches occasionally at baseline, never like this, does endorse having a severe and atypical headache about 4-6 weeks ago, none since. No new weakness, numbness, or parasthesias, no recent change in bowel or bladder function. No recent use of anti-platelet or anti-coagulant medications. Family hx positive for a father that died at [facility] from a ruptured intracranial aneurysm, no other known family hx of aneurysms.    ROS: A 14 point ROS was performed and is negative except as noted in the HPI.   PMHx:  Past Medical History:  Diagnosis Date   Asthma    Depression    FamHx:  Family History  Problem Relation Age of Onset   Diabetes Mother    SocHx:  reports that she has been smoking cigarettes. She has a 2.50 pack-year smoking history. She has never used smokeless tobacco. She reports current alcohol use. She reports that she does not use drugs.  Exam: Vital signs in last 24 hours: Temp:  [97.7 F (36.5 C)-99.6 F (37.6 C)] 99.6 F (37.6 C) (09/28 0713) Pulse Rate:  [68-105] 84 (09/28 0645) Resp:  [16-25] 18 (09/28 0645) BP: (102-127)/(52-86) 114/72 (09/28 0645) SpO2:  [98 %-100 %] 100 % (09/28 0645) Weight:  [78.9 kg] 78.9 kg (09/28 0714) General: Awake, alert, cooperative, lying in bed in NAD Head: Normocephalic and atruamatic HEENT: +neck stiffness, +kernig's Pulmonary: breathing room air comfortably, no evidence of increased work of breathing Cardiac: RRR Abdomen: S NT ND Extremities: Warm and well perfused x4 Neuro: AOx3, PERRL, EOMI, FS Strength 5/5 x4, SILTx4   Assessment and Plan: 36 y.o. woman w/ WHOL. CTH / MRI personally reviewed, CT shows some effacement of the cisterns, MRI shows +gradient echo signal in the basal cisterns, 4th ventricle, and L CPA, hyperintense on FLAIR and mildly hyperintense on T1, most  c/w hemorrhage. 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  -HH1, mF3 -admit to 4N ICU -NPO for possible angiogram, will d/w Dr. Kathyrn Sheriff and update diet accordingly -nimodipine, SBP<140 -SCDs/TEDs  Judith Part, MD 12/21/21 8:43 AM [facility]

## 2021-12-22 ENCOUNTER — Inpatient Hospital Stay (HOSPITAL_COMMUNITY): Payer: BC Managed Care – PPO

## 2021-12-22 ENCOUNTER — Encounter (HOSPITAL_COMMUNITY): Payer: Self-pay | Admitting: Neurosurgery

## 2021-12-22 DIAGNOSIS — I609 Nontraumatic subarachnoid hemorrhage, unspecified: Secondary | ICD-10-CM

## 2021-12-22 MED ORDER — POTASSIUM CHLORIDE CRYS ER 20 MEQ PO TBCR
40.0000 meq | EXTENDED_RELEASE_TABLET | Freq: Once | ORAL | Status: AC
  Administered 2021-12-22: 40 meq via ORAL
  Filled 2021-12-22: qty 2

## 2021-12-22 MED ORDER — STROKE: EARLY STAGES OF RECOVERY BOOK
Freq: Once | Status: AC
  Filled 2021-12-22: qty 1

## 2021-12-22 NOTE — Progress Notes (Signed)
Transcranial Doppler  Date POD PCO2 HCT BP  MCA ACA PCA OPHT SIPH VERT Basilar  9/29 GC     Right  Left   48  71   -27  -50   35  -27   11  14   12  15    -33  -31   -51           Right  Left                                            Right  Left                                             Right  Left                                             Right  Left                                            Right  Left                                            Right  Left                                        MCA = Middle Cerebral Artery      OPHT = Opthalmic Artery     BASILAR = Basilar Artery   ACA = Anterior Cerebral Artery     SIPH = Carotid Siphon PCA = Posterior Cerebral Artery   VERT = Verterbral Artery                   Normal MCA = 62+\-12 ACA = 50+\-12 PCA = 42+\-23  Right Lindegaard ratio : 1.37 Left Lindegaard ratio : 2.84  12/22/21 2:30 PM Carlos Levering RVT

## 2021-12-22 NOTE — Progress Notes (Signed)
  NEUROSURGERY PROGRESS NOTE   Pt seen and examined. No issues overnight. Unchanged HA, otherwise no complaints.  EXAM: Temp:  [97.9 F (36.6 C)-98.8 F (37.1 C)] 98.8 F (37.1 C) (09/29 1116) Pulse Rate:  [61-95] 67 (09/29 1100) Resp:  [12-29] 15 (09/29 1100) BP: (96-123)/(27-87) 115/72 (09/29 1100) SpO2:  [93 %-100 %] 96 % (09/29 1100) Arterial Line BP: (113-150)/(57-82) 131/71 (09/29 1000) Weight:  [83.2 kg] 83.2 kg (09/28 1230) Intake/Output      09/28 0701 09/29 0700 09/29 0701 09/30 0700   I.V. (mL/kg) 1751.5 (21.1) 300.1 (3.6)   Total Intake(mL/kg) 1751.5 (21.1) 300.1 (3.6)   Net +1751.5 +300.1        Urine Occurrence 1 x     Awake, alert, oriented Speech fluent CN intact MAE good strength Right groin soft  LABS: Lab Results  Component Value Date   CREATININE 0.64 12/20/2021   BUN 9 12/20/2021   NA 138 12/20/2021   K 3.3 (L) 12/20/2021   CL 110 12/20/2021   CO2 20 (L) 12/20/2021   Lab Results  Component Value Date   WBC 17.0 (H) 12/21/2021   HGB 10.7 (L) 12/21/2021   HCT 32.3 (L) 12/21/2021   MCV 92.8 12/21/2021   PLT 279 12/21/2021     IMPRESSION: - 36 y.o. female POD#1 coil embolization of right Pcom aneurysm, doing well after subacute hemorrhage presentation. No spasm on angiogram yesterday.  PLAN: - d/c A-line - Can d/c Nimotop - Mobilize today - Likely d/c home tomorrow   Kerry Lose, MD [facility]

## 2021-12-22 NOTE — TOC Progression Note (Signed)
Transition of Care [facility]) - Initial/Assessment Note    Patient Details  Name: Kerry Chavez MRN: [id number] Date of Birth: 10/04/1985  Transition of Care [facility]) CM/SW Contact:    Milinda Antis, [place] Phone Number: 12/22/2021, 4:01 PM  Clinical Narrative:                  Transition of Care Department [facility]) has reviewed patient and no TOC needs have been identified at this time.   We will continue to monitor patient advancement through interdisciplinary progression rounds.   If new patient transition needs arise, please place a TOC consult.     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[place]) [I60.8] Near syncope [R55] Patient Active Problem List   Diagnosis Date Noted   Intracranial hemorrhage ([place]) 12/21/2021   SAH (subarachnoid hemorrhage) ([place]) 12/21/2021   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[place]) 12/21/2021   Skin cyst 12/13/2020   Generalized anxiety disorder 12/13/2020   Palpitation 12/13/2020   Vitamin D deficiency 06/23/2020   Tachycardia 11/28/2019   Ganglion cyst of left foot 11/28/2019   Anemia 05/07/2019   Pica in adults 05/07/2019   Vaginal odor 02/02/2019   Screen for STD (sexually transmitted disease) 04/25/2018   Screening for malignant neoplasm of cervix 04/25/2018   TOBACCO USER 12/04/2008   OBESITY, UNSPECIFIED 11/10/2007   Depression with anxiety 12/18/2006   PCP:  Ezequiel Essex, MD Pharmacy:   [id number] - [place], [place] [address] [place] [address] Phone: [phone] Fax: [place], [place] [address] [place] [address] Phone: [phone] Fax: [phone]     Social Determinants of Health (SDOH) Interventions    Readmission Risk Interventions     No data to display

## 2021-12-22 NOTE — Addendum Note (Signed)
Addendum  created 12/22/21 1459 by Moshe Salisbury, CRNA   Intraprocedure Event edited

## 2021-12-23 MED ORDER — HYDROCODONE-ACETAMINOPHEN 5-325 MG PO TABS: 1.0000 | ORAL_TABLET | Freq: Four times a day (QID) | ORAL | 0 refills | Status: AC | PRN

## 2021-12-23 NOTE — Discharge Summary (Signed)
Physician Discharge Summary  Patient ID: Kerry Chavez MRN: [id number] DOB/AGE: [birth year] 36 y.o.  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 female With a ruptured Pcom . This was coiled without problems. Post coiling she is doing well. Moving all extremities well. Normal neurological exam.  Treatments: surgery: aneurysm coiling  Discharge Exam: Blood pressure 121/68, pulse 65, temperature 98.2 F (36.8 C), temperature source Oral, resp. rate 19, height 5\' 1"  (1.549 m), weight 83.2 kg, SpO2 99 %. General appearance: alert, cooperative, appears stated age, and mild distress  Disposition: Discharge disposition: 01-Home or Self Care      SAH  Allergies as of 12/23/2021       Reactions   Shellfish Allergy Swelling   Ivp Dye [iodinated Contrast Media] Other (See Comments)   Unknown reaction   Penicillins Hives, Itching, Swelling   REACTION: rash   Sulfamethoxazole Hives, Swelling   REACTION: rash   Latex Itching, Rash   With condoms only        Medication List     TAKE these medications    albuterol 108 (90 Base) MCG/ACT inhaler Commonly known as: VENTOLIN HFA Inhale 2 puffs into the lungs every 4 (four) hours as needed for wheezing or shortness of breath. Pt states uses seasonally during winter.   ferrous sulfate 324 (65 Fe) MG Tbec Take 1 tablet (325 mg total) by mouth daily.   HYDROcodone-acetaminophen 5-325 MG tablet Commonly known as: NORCO/VICODIN Take 1-2 tablets by mouth every 6 (six) hours as needed for up to 7 days for moderate pain or severe pain.   hydrOXYzine 10 MG tablet Commonly known as: ATARAX Take 1 tablet  (10 mg total) by mouth 3 (three) times daily as needed. What changed: reasons to take this   ibuprofen 200 MG tablet Commonly known as: ADVIL Take 400 mg by mouth daily as needed for headache.   venlafaxine XR 37.5 MG 24 hr capsule Commonly known as: Effexor XR Take 1 capsule (37.5 mg total) by mouth daily with breakfast.   Vitamin D (Ergocalciferol) 1.25 MG (50000 UNIT) Caps capsule Commonly known as: DRISDOL Take 1 capsule (50,000 Units total) by mouth every 7 (seven) days.        Follow-up Information     Consuella Lose, MD Follow up.   Specialty: Neurosurgery Why: please call the office for follow up appointment Contact information: [address] [place] [address] [phone]                 Signed: Ashok Pall 12/23/2021, 9:57 AM

## 2021-12-23 NOTE — Progress Notes (Signed)
Pt discharged. Escorted out of ICU by this RN via wheelchair to family's car. All IV's removed and all belongings sent with patient. Pt verbalizes that she understands the discharge instructions relayed to her.

## 2021-12-28 ENCOUNTER — Other Ambulatory Visit (HOSPITAL_COMMUNITY): Payer: Self-pay | Admitting: Neurosurgery

## 2021-12-28 ENCOUNTER — Encounter (HOSPITAL_COMMUNITY): Payer: Self-pay | Admitting: Radiology

## 2021-12-28 ENCOUNTER — Telehealth: Payer: Self-pay

## 2021-12-28 ENCOUNTER — Ambulatory Visit: Payer: BC Managed Care – PPO | Admitting: Podiatry

## 2021-12-28 DIAGNOSIS — I729 Aneurysm of unspecified site: Secondary | ICD-10-CM

## 2021-12-28 NOTE — Telephone Encounter (Signed)
Patient calls nurse line requesting a medication for anxiety.   Patient reports she has a lot of anxious feelings and racing thoughts surrounding recent medical event.  Patient reports she is not currently on anything now. Patient reports she has an apt on 10/12 with PCP for a hospital FU, however is requesting a sooner apt.   Patient scheduled for 10/10 with [facility].

## 2022-01-02 ENCOUNTER — Ambulatory Visit (INDEPENDENT_AMBULATORY_CARE_PROVIDER_SITE_OTHER): Payer: BC Managed Care – PPO | Admitting: Family Medicine

## 2022-01-02 ENCOUNTER — Encounter: Payer: Self-pay | Admitting: Family Medicine

## 2022-01-02 VITALS — BP 110/60 | HR 87 | Wt 182.0 lb

## 2022-01-02 DIAGNOSIS — F411 Generalized anxiety disorder: Secondary | ICD-10-CM | POA: Diagnosis not present

## 2022-01-02 MED ORDER — BUSPIRONE HCL 5 MG PO TABS: 5.0000 mg | ORAL_TABLET | Freq: Three times a day (TID) | ORAL | 0 refills | Status: DC | PRN

## 2022-01-02 MED ORDER — VENLAFAXINE HCL ER 75 MG PO CP24: 75.0000 mg | ORAL_CAPSULE | Freq: Every day | ORAL | 0 refills | Status: DC

## 2022-01-02 NOTE — Patient Instructions (Addendum)
It was great to meet you!  I have sent 2 medications to your pharmacy.  Venlafaxine (Effexor)--take this once daily.  Think of it as your maintenance medication.  It can take up to 6 weeks to have an effect. BuSpar--take this up to 3 times a day as needed for significant anxiety or panic attacks.  Think of this as your rescue medication.  It is very important to engage in regular counseling/therapy as well.  You can find a provider on [email or web address] OR contact your insurance to find a therapist.  Elinor Dodge is an app that could get you started as well.  [facility]  [address] [place], [place] [facility] [phone]  Follow-up in 1 month  Take care, Dr Rock Nephew

## 2022-01-02 NOTE — Progress Notes (Unsigned)
    SUBJECTIVE:   CHIEF COMPLAINT / HPI:   Anxiety Patient with history of GAD.  Has trialed citalopram, escitalopram, fluoxetine in the past. Most recently was recommended venlafaxine 37.5mg  and hydroxyzine 10mg  TID prn. Tried the venlafaxine for 1 month, tolerated it fine but didn't feel any benefit so she stopped.  Hydroxyzine made her too sleepy. She is not in therapy.  Anxiety has worsened recently due to ruptured PCOM aneurysm requiring hospital admission & neurosurgical intervention (aneurysm coiling). She feels antsy and on-edge all the time. Has mild panic attacks where she feels her heart racing, difficulty breathing, tremulousness, spiraling thoughts that something bad is going to happen.  PERTINENT  PMH / PSH: h/o tobacco use  OBJECTIVE:   BP 110/60   Pulse 87   Wt 182 lb (82.6 kg)   LMP 12/20/2021   SpO2 98%   BMI 34.39 kg/m   Gen: NAD, pleasant, able to participate in exam CV: RRR, normal S1/S2, no murmur Resp: Normal effort, lungs CTAB Extremities: no edema or cyanosis Skin: warm and dry, no rashes noted Neuro: alert, no obvious focal deficits Psych: Well-groomed, mildly anxious appearing, normal speech, normal thought content, good insight   ASSESSMENT/PLAN:   Generalized anxiety disorder Recurrent, not well controlled. Exacerbated by recent medical issue (brain aneurysm/bleed). -Re-trial of venlafaxine, will start at 75mg  daily -Discussed importance of 6-8 week trial at adequate dose (as long as she's tolerating it) -Buspar 5mg  TID prn for significant anxiety/panic attacks -Discussed synergistic effects of medications and therapy. Patient motivated to establish with therapist. Resources given in AVS. -Follow up in 1 month    Alcus Dad, MD [place]

## 2022-01-03 ENCOUNTER — Encounter: Payer: Self-pay | Admitting: Family Medicine

## 2022-01-03 DIAGNOSIS — I609 Nontraumatic subarachnoid hemorrhage, unspecified: Secondary | ICD-10-CM | POA: Diagnosis not present

## 2022-01-03 NOTE — Assessment & Plan Note (Signed)
Recurrent, not well controlled. Exacerbated by recent medical issue (brain aneurysm/bleed). -Re-trial of venlafaxine, will start at 75mg  daily -Discussed importance of 6-8 week trial at adequate dose (as long as she's tolerating it) -Buspar 5mg  TID prn for significant anxiety/panic attacks -Discussed synergistic effects of medications and therapy. Patient motivated to establish with therapist. Resources given in AVS. -Follow up in 1 month

## 2022-01-03 NOTE — Assessment & Plan Note (Signed)
>>  ASSESSMENT AND PLAN FOR GENERALIZED ANXIETY DISORDER WRITTEN ON 01/03/2022  8:06 AM BY Maury Dus, MD  Recurrent, not well controlled. Exacerbated by recent medical issue (brain aneurysm/bleed). -Re-trial of venlafaxine, will start at 75mg  daily -Discussed importance of 6-8 week trial at adequate dose (as long as she's tolerating it) -Buspar 5mg  TID prn for significant anxiety/panic attacks -Discussed synergistic effects of medications and therapy. Patient motivated to establish with therapist. Resources given in AVS. -Follow up in 1 month

## 2022-01-04 ENCOUNTER — Inpatient Hospital Stay: Payer: BC Managed Care – PPO | Admitting: Family Medicine

## 2022-01-05 ENCOUNTER — Ambulatory Visit: Payer: BC Managed Care – PPO | Admitting: Podiatry

## 2022-01-09 DIAGNOSIS — H5340 Unspecified visual field defects: Secondary | ICD-10-CM | POA: Diagnosis not present

## 2022-01-09 DIAGNOSIS — H5213 Myopia, bilateral: Secondary | ICD-10-CM | POA: Diagnosis not present

## 2022-02-07 IMAGING — US US SOFT TISSUE
1 series · 12 of 12 positions shown · non-contrast
Comparison: None.

CLINICAL DATA: 35-year-old female with palpable abnormality
superior to the left breast and inferior to the lateral clavicle.

EXAM:
ULTRASOUND OF CHEST SOFT TISSUES
TECHNIQUE: Ultrasound examination of the chest wall soft tissues was performed
in the area of clinical concern.

[Series 1: us soft tissue · 12 acquisitions, 12 frames shown]
[im 1/12]
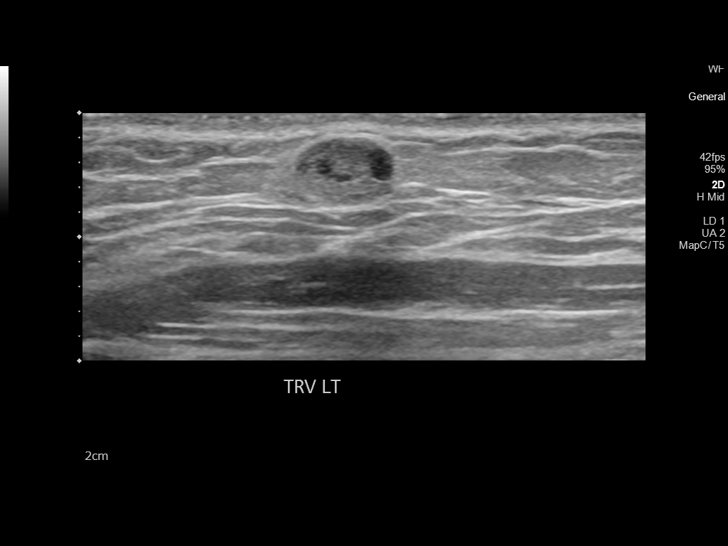
[im 2/12]
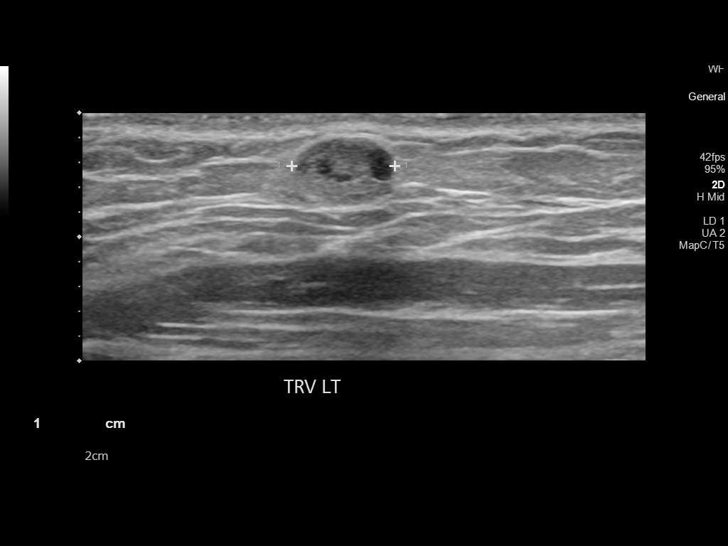
[im 3/12]
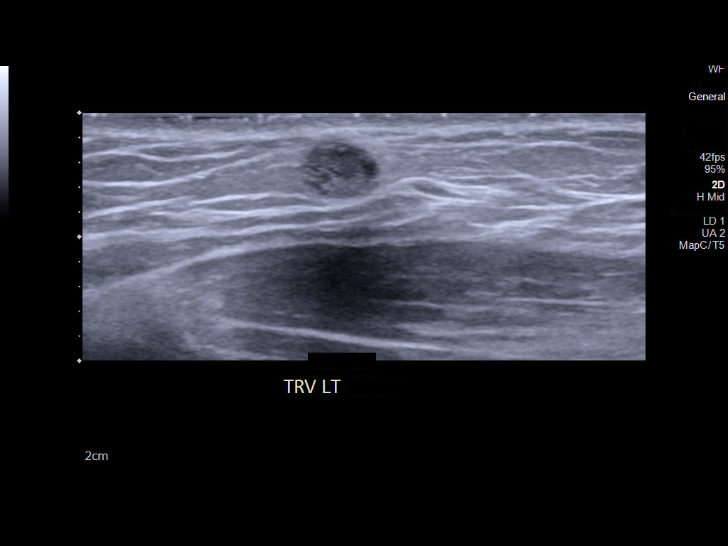
[im 4/12]
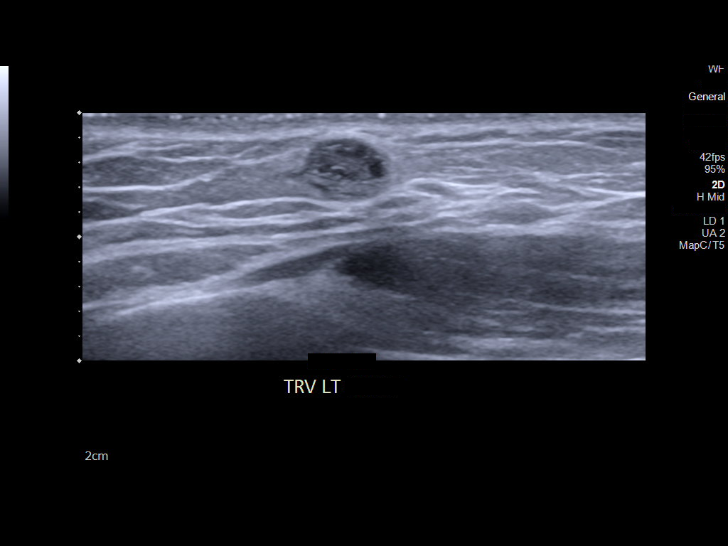
[im 5/12]
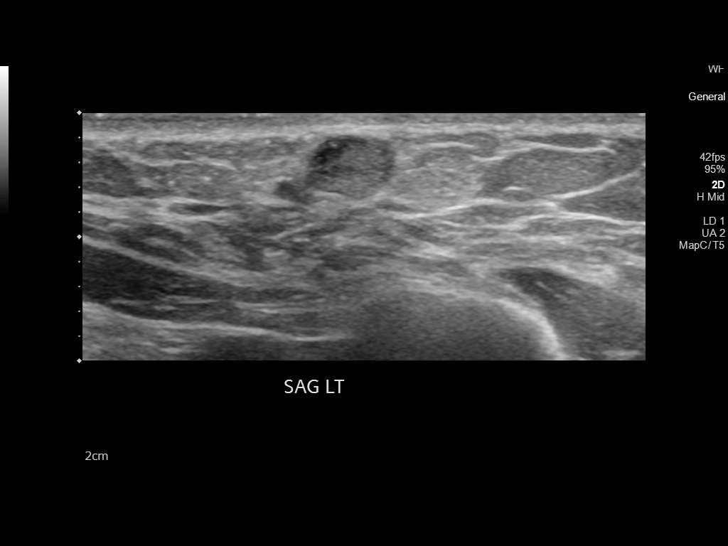
[im 6/12]
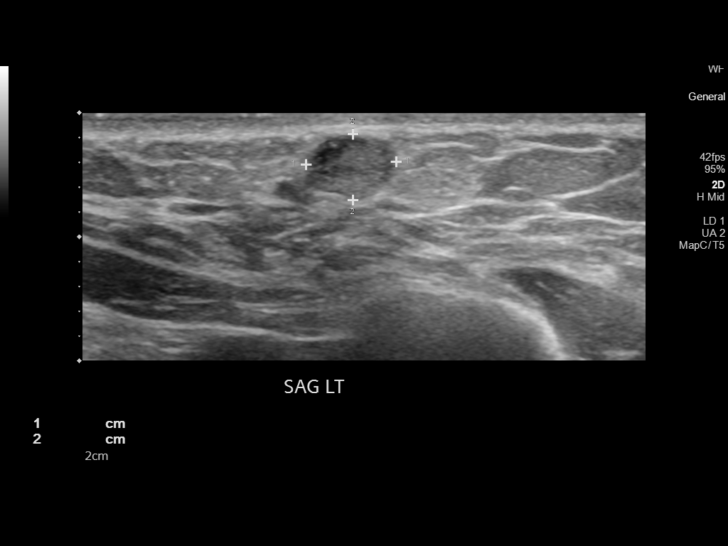
[im 7/12]
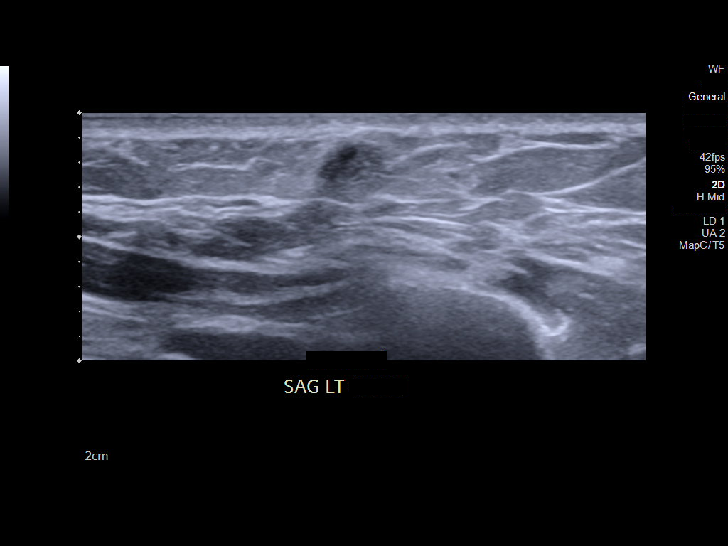
[im 8/12]
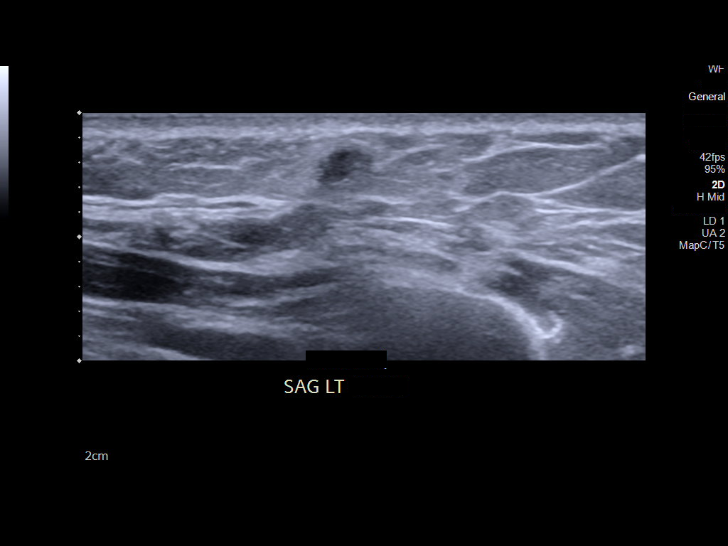
[im 9/12]
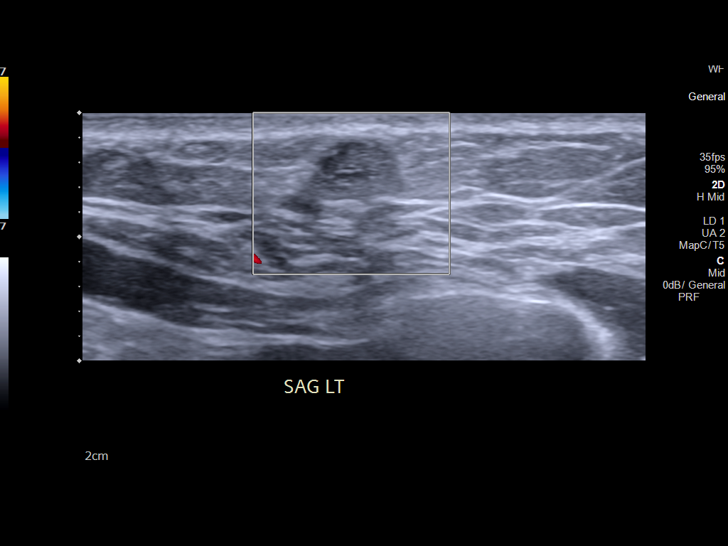
[im 10/12]
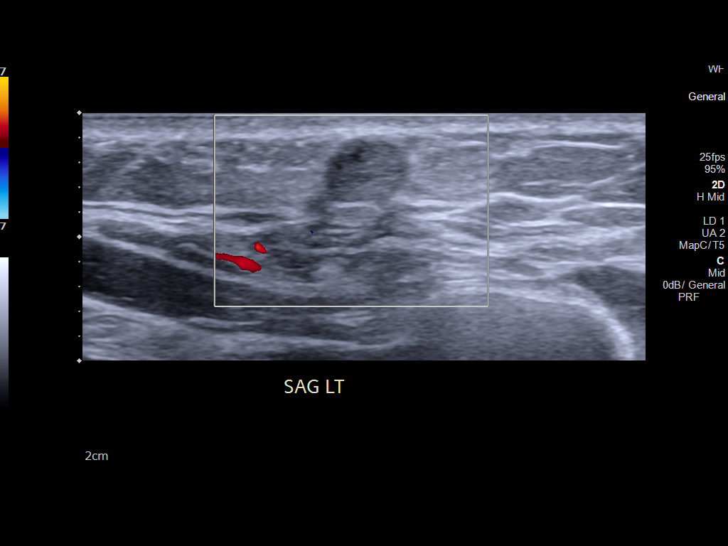
[im 11/12]
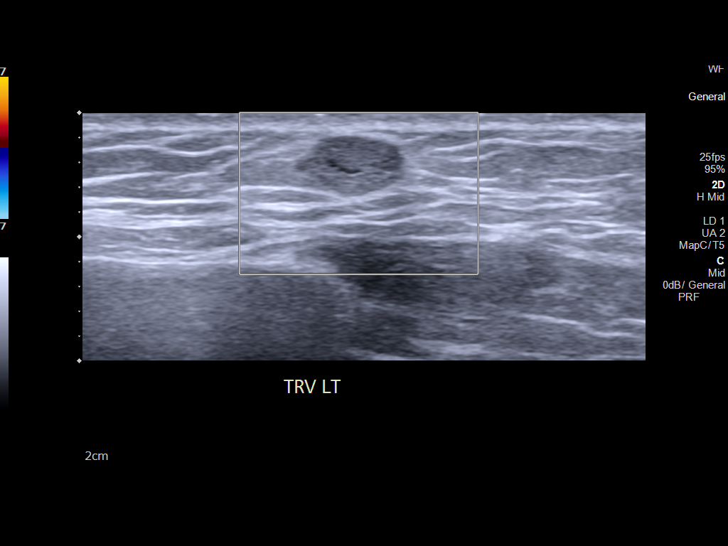
[im 12/12]
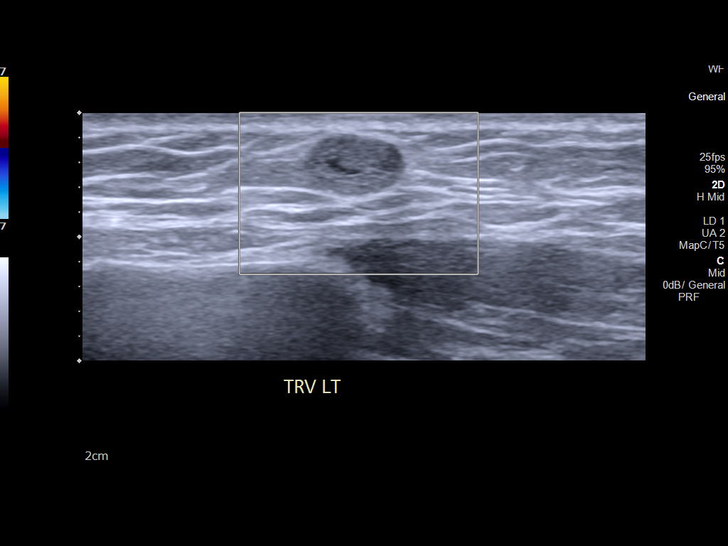

[12 of 12 positions shown; findings below may reference images not displayed]

FINDINGS: Grayscale and brief color Doppler images in the area of clinical
concern demonstrate a small round 7-8 mm diameter mixed echogenic
focus which appears to be just deep to the skin surface in the
subcutaneous fat (image 5). No internal vascular elements or
regional hypervascularity. Other regional fibromuscular architecture
appears normal. There do appear to be some small regional lymph
nodes on the cine images (series 3, image 15).
IMPRESSION: Small 8 mm round subcutaneous mass with mixed echogenicity, no
hypervascularity.
This is more suggestive of a sebaceous cyst or similar lesion than
an abnormal lymph node. And there are nearby small physiologic lymph
nodes.
But recommend follow-up by clinical exam, with Chest CT (IV contrast
preferred) if the area enlarges or becomes painful.

## 2022-02-12 DIAGNOSIS — I609 Nontraumatic subarachnoid hemorrhage, unspecified: Secondary | ICD-10-CM | POA: Diagnosis not present

## 2022-02-27 ENCOUNTER — Ambulatory Visit (INDEPENDENT_AMBULATORY_CARE_PROVIDER_SITE_OTHER): Payer: BC Managed Care – PPO | Admitting: Internal Medicine

## 2022-02-27 ENCOUNTER — Encounter: Payer: Self-pay | Admitting: Internal Medicine

## 2022-02-27 VITALS — BP 125/87 | HR 96 | Temp 98.2°F | Ht 61.0 in | Wt 206.6 lb

## 2022-02-27 DIAGNOSIS — F411 Generalized anxiety disorder: Secondary | ICD-10-CM

## 2022-02-27 DIAGNOSIS — E559 Vitamin D deficiency, unspecified: Secondary | ICD-10-CM

## 2022-02-27 DIAGNOSIS — E611 Iron deficiency: Secondary | ICD-10-CM | POA: Insufficient documentation

## 2022-02-27 DIAGNOSIS — D500 Iron deficiency anemia secondary to blood loss (chronic): Secondary | ICD-10-CM | POA: Diagnosis not present

## 2022-02-27 DIAGNOSIS — I608 Other nontraumatic subarachnoid hemorrhage: Secondary | ICD-10-CM

## 2022-02-27 MED ORDER — HYDROXYZINE HCL 25 MG PO TABS: 25.0000 mg | ORAL_TABLET | Freq: Three times a day (TID) | ORAL | 0 refills | Status: DC | PRN

## 2022-02-27 MED ORDER — QUETIAPINE FUMARATE 25 MG PO TABS: 25.0000 mg | ORAL_TABLET | Freq: Every day | ORAL | 2 refills | Status: DC

## 2022-02-27 MED ORDER — TRAZODONE HCL 50 MG PO TABS: 25.0000 mg | ORAL_TABLET | Freq: Every day | ORAL | 2 refills | Status: DC

## 2022-02-27 NOTE — Patient Instructions (Addendum)
Ms. Humann,  It was a pleasure to meet you today!  I will let you know what your lab results show. I have placed a referral to OB/Gyn.  For your anxiety I have sent in quetiapine 25 mg daily and hydroxyzine 25 mg up to three times daily as needed for breakthrough anxiety. I have also sent in trazodone, 25-50 mg each night, to help you sleep. This medicine may also help with your anxiety.  I would like you to follow up in about 1 month for your anxiety. We have room to increase the doses of your medications for better control of your symptoms.  My best, Dr. August Saucer

## 2022-02-27 NOTE — Progress Notes (Signed)
CC: establish care  HPI:  Kerry Chavez is a 36 y.o. person with past medical history as detailed below who presents today to establish care. Please see below for full medical history and problem based charting for detailed assessment and plan.  Past Medical History:  Diagnosis Date   Anxiety    Asthma    Depression    Past Surgical History:  Procedure Laterality Date   CESAREAN SECTION     CESAREAN SECTION WITH BILATERAL TUBAL LIGATION Bilateral 06/12/2014   Procedure: Repeat CESAREAN SECTION WITH BILATERAL TUBAL LIGATION;  Surgeon: Maxie Better, MD;  Location: [facility];  Service: Obstetrics;  Laterality: Bilateral;  EDD: 06/19/14 Wants clear C-Section drape for this case   IR ANGIO INTRA EXTRACRAN SEL INTERNAL CAROTID UNI L MOD SED  12/21/2021   IR ANGIO INTRA EXTRACRAN SEL INTERNAL CAROTID UNI R MOD SED  12/21/2021   IR ANGIO VERTEBRAL SEL VERTEBRAL BILAT MOD SED  12/21/2021   IR ANGIOGRAM FOLLOW UP STUDY  12/21/2021   IR ANGIOGRAM FOLLOW UP STUDY  12/21/2021   IR TRANSCATH/EMBOLIZ  12/21/2021   RADIOLOGY WITH ANESTHESIA N/A 12/21/2021   Procedure: IR WITH ANESTHESIA;  Surgeon: Lisbeth Renshaw, MD;  Location: [facility] OR;  Service: Radiology;  Laterality: N/A;   TONSILLECTOMY AND ADENOIDECTOMY  1998   Current Outpatient Medications on File Prior to Visit  Medication Sig Dispense Refill   [DISCONTINUED] citalopram (CELEXA) 20 MG tablet Take 1 tablet (20 mg total) by mouth daily. 30 tablet 2   No current facility-administered medications on file prior to visit.   Allergies  Allergen Reactions   Shellfish Allergy Swelling   Ivp Dye [Iodinated Contrast Media] Other (See Comments)    Unknown reaction   Penicillins Hives, Itching and Swelling    REACTION: rash   Sulfamethoxazole Hives and Swelling    REACTION: rash   Latex Itching and Rash    With condoms only   Social History   Socioeconomic History   Marital status: Single    Spouse name: Not on file   Number of  children: 2   Years of education: Not on file   Highest education level: Not on file  Occupational History   Occupation: Customer Service  Tobacco Use   Smoking status: Former    Packs/day: 0.25    Years: 10.00    Total pack years: 2.50    Types: Cigarettes    Quit date: 12/20/2021    Years since quitting: 0.1    Passive exposure: Past   Smokeless tobacco: Never  Substance and Sexual Activity   Alcohol use: Yes    Comment: Socially   Drug use: Not Currently   Sexual activity: Yes  Other Topics Concern   Not on file  Social History Narrative   Not on file   Social Determinants of Health   Financial Resource Strain: Not on file  Food Insecurity: No Food Insecurity (02/27/2022)   Hunger Vital Sign    Worried About Running Out of Food in the Last Year: Never true    Ran Out of Food in the Last Year: Never true  Transportation Needs: No Transportation Needs (02/27/2022)   PRAPARE - Administrator, Civil Service (Medical): No    Lack of Transportation (Non-Medical): No  Physical Activity: Not on file  Stress: Not on file  Social Connections: Moderately Isolated (02/27/2022)   Social Connection and Isolation Panel [NHANES]    Frequency of Communication with Friends and Family: More than three  times a week    Frequency of Social Gatherings with Friends and Family: More than three times a week    Attends Religious Services: More than 4 times per year    Active Member of [employer] or Organizations: No    Attends Banker Meetings: Never    Marital Status: Never married  Intimate Partner Violence: Not At Risk (02/27/2022)   Humiliation, Afraid, Rape, and Kick questionnaire    Fear of Current or Ex-Partner: No    Emotionally Abused: No    Physically Abused: No    Sexually Abused: No   Family History  Problem Relation Age of Onset   Diabetes Mother    Cerebral aneurysm Father    CVA Father     Review of Systems:  Negative unless otherwise  stated.  Physical Exam:  Vitals:   02/27/22 1037 02/27/22 1049  BP: (!) 125/103 125/87  Pulse: 96 96  Temp: 98.2 F (36.8 C)   TempSrc: Oral   SpO2: 98%   Weight: 206 lb 9.6 oz (93.7 kg)   Height: 5\' 1"  (1.549 m)    Constitutional:Appears stated age, well. Tearful at times throughout discussion. In no acute distress. Cardio:Regular rate and rhythm. No murmurs, rubs, or gallops. Pulm:Clear to auscultation bilaterally. Normal work of breathing on room air. for extremity edema. Skin:Warm and dry. Neuro:Alert and oriented x3. No focal deficit noted. Psych:Pleasant mood and affect.   Assessment & Plan:   See Encounters Tab for problem based charting.  Generalized anxiety disorder GAD-7 is 19. Patient states that she has always had anxiety, but that it is much worse since her aneurysm. She has tried citalopram, escitalopram, fluoxetine, and venlafaxine in the past. She has either had incomplete response to or unsatisfactory side effects from these medications. She is having difficulty sleeping at this time and takes benedryl or drinks wine to try to induce sleep.   She struggles with outings to the grocery store or walking around her daughter's campus, for example. She is constantly in fear that she will have another aneurysm burst but also has guilt that she survived this happening but her father, who had the same medical event occur to him, did not. She tries to find a balance in allowing herself to feel these deep emotions and grief with being grateful that she did survive. She has been trying to take care of herself spiritually more.   I educated the patient on the adverse effects of alcohol and sleep quality, and how medical therapy is often prescribed alongside talk therapy. I expressed empathy for her current situation and allowed her to express her range of emotions regarding the recent, life changing event that was her aneurysmal SAH.   I also educated her on the  nature of anxiolytic medication and how it is necessary to allow the medication adequate time to take full effect, 4-6 weeks of consistently taking the medication. I made her aware of the fact that the doses of the medications below are starting doses with room to increase the doses to achieve symptom control. She voiced understanding. Plan:Start sertraline 25 mg daily, trazodone 25-50 mg daily at bedtime, and hydroxyzine 25 mg TID PRN breakthrough anxiety. Follow up in 1 month.  Iron deficiency anemia due to chronic blood loss Patient has history of iron deficiency anemia, previously on iron supplementation however she is not currently taking this. She has a menstrual cycle each month that lasts 6-7 days and with heavy flow. She has pica with  both ice chips and starch. Ferritin was last checked 04/2019 and was 10. She is requesting birth control to help with her menstrual bleeding. Plan:CBC, iron studies collected today. Will refer to OB/Gyn given heavy menstruation with complicating factor of aneurysmal SAH.  Vitamin D deficiency Patient was previously prescribed vitamin D supplementation, 50,000 units once weekly for 8 weeks. She is not currently on any supplementation. This level was 30.1 when last checked 09/2020. Plan:Check vitamin D level today.  Aneurysmal subarachnoid hemorrhage [facility]) Patient would like to speak with a genetic counselor regarding her intracranial aneurysms. Her father also had intracranial aneurysms and died due to hemorrhage from his aneurysms. She reports being told in the hospital that this was likely genetic. Plan:Referral to geneticist placed. Will assist in controlling BP, encourage continued tobacco cessation, and encourage minimal to no alcohol consumption to further reduce future risks of complications due to her aneurysms.  Patient discussed with Dr. Antony Contras

## 2022-02-27 NOTE — Assessment & Plan Note (Signed)
Patient has history of iron deficiency anemia, previously on iron supplementation however she is not currently taking this. She has a menstrual cycle each month that lasts 6-7 days and with heavy flow. She has pica with both ice chips and starch. Ferritin was last checked 04/2019 and was 10. She is requesting birth control to help with her menstrual bleeding. Plan:CBC, iron studies collected today. Will refer to OB/Gyn given heavy menstruation with complicating factor of aneurysmal SAH.

## 2022-02-27 NOTE — Progress Notes (Deleted)
GAD-7 is 19. Much worse since her aneurysm citalopram, escitalopram, fluoxetine in the past  No venlafaxine, hydroxyzine Is taking benadryl  Not sleeping Needs follow up with neurosurgery. They said they'll call her in the spring Has tried venlafaxine on and off for several years  but doesn't like how it makes her feel *sertraline 25? +hydroxy 25 tid prn, propranolol 10 30-24m before leaving house??   Menstrual cycle is very heavy. Has a cycle once a month 6-7 days with clots and it's heavy. Has pica with starch and ice chips Ferritin was 10 04/2019 09/2020 vit d 30.1 Wants OCP  Frosperinone-ethinyl estradiol  *cbc Vit d Ferritin Hep c Urine pregnancy test?  Says she's had a pap in the last 3 years and [facility] was [facility]

## 2022-02-27 NOTE — Assessment & Plan Note (Signed)
>>  ASSESSMENT AND PLAN FOR GENERALIZED ANXIETY DISORDER WRITTEN ON 02/27/2022  1:58 PM BY DEAN, EMILY, DO  GAD-7 is 19. Patient states that she has always had anxiety, but that it is much worse since her aneurysm. She has tried citalopram, escitalopram, fluoxetine, and venlafaxine in the past. She has either had incomplete response to or unsatisfactory side effects from these medications. She is having difficulty sleeping at this time and takes benedryl or drinks wine to try to induce sleep.   She struggles with outings to the grocery store or walking around her daughter's campus, for example. She is constantly in fear that she will have another aneurysm burst but also has guilt that she survived this happening but her father, who had the same medical event occur to him, did not. She tries to find a balance in allowing herself to feel these deep emotions and grief with being grateful that she did survive. She has been trying to take care of herself spiritually more.   I educated the patient on the adverse effects of alcohol and sleep quality, and how medical therapy is often prescribed alongside talk therapy. I expressed empathy for her current situation and allowed her to express her range of emotions regarding the recent, life changing event that was her aneurysmal SAH.   I also educated her on the nature of anxiolytic medication and how it is necessary to allow the medication adequate time to take full effect, 4-6 weeks of consistently taking the medication. I made her aware of the fact that the doses of the medications below are starting doses with room to increase the doses to achieve symptom control. She voiced understanding. Plan:Start sertraline 25 mg daily, trazodone 25-50 mg daily at bedtime, and hydroxyzine 25 mg TID PRN breakthrough anxiety. Follow up in 1 month.

## 2022-02-27 NOTE — Assessment & Plan Note (Signed)
GAD-7 is 19. Patient states that she has always had anxiety, but that it is much worse since her aneurysm. She has tried citalopram, escitalopram, fluoxetine, and venlafaxine in the past. She has either had incomplete response to or unsatisfactory side effects from these medications. She is having difficulty sleeping at this time and takes benedryl or drinks wine to try to induce sleep.   She struggles with outings to the grocery store or walking around her daughter's campus, for example. She is constantly in fear that she will have another aneurysm burst but also has guilt that she survived this happening but her father, who had the same medical event occur to him, did not. She tries to find a balance in allowing herself to feel these deep emotions and grief with being grateful that she did survive. She has been trying to take care of herself spiritually more.   I educated the patient on the adverse effects of alcohol and sleep quality, and how medical therapy is often prescribed alongside talk therapy. I expressed empathy for her current situation and allowed her to express her range of emotions regarding the recent, life changing event that was her aneurysmal SAH.   I also educated her on the nature of anxiolytic medication and how it is necessary to allow the medication adequate time to take full effect, 4-6 weeks of consistently taking the medication. I made her aware of the fact that the doses of the medications below are starting doses with room to increase the doses to achieve symptom control. She voiced understanding. Plan:Start sertraline 25 mg daily, trazodone 25-50 mg daily at bedtime, and hydroxyzine 25 mg TID PRN breakthrough anxiety. Follow up in 1 month.

## 2022-02-27 NOTE — Progress Notes (Signed)
Internal Medicine Clinic Attending ? ?Case discussed with Dr. Dean  At the time of the visit.  We reviewed the resident?s history and exam and pertinent patient test results.  I agree with the assessment, diagnosis, and plan of care documented in the resident?s note.  ?

## 2022-02-27 NOTE — Assessment & Plan Note (Signed)
Patient would like to speak with a genetic counselor regarding her intracranial aneurysms. Her father also had intracranial aneurysms and died due to hemorrhage from his aneurysms. She reports being told in the hospital that this was likely genetic. Plan:Referral to geneticist placed. Will assist in controlling BP, encourage continued tobacco cessation, and encourage minimal to no alcohol consumption to further reduce future risks of complications due to her aneurysms.

## 2022-02-27 NOTE — Assessment & Plan Note (Signed)
Patient was previously prescribed vitamin D supplementation, 50,000 units once weekly for 8 weeks. She is not currently on any supplementation. This level was 30.1 when last checked 09/2020. Plan:Check vitamin D level today.

## 2022-02-28 LAB — CBC
Hematocrit: 34.5 % (ref 34.0–46.6)
Hemoglobin: 10.8 g/dL — ABNORMAL LOW (ref 11.1–15.9)
MCH: 28.5 pg (ref 26.6–33.0)
MCHC: 31.3 g/dL — ABNORMAL LOW (ref 31.5–35.7)
MCV: 91 fL (ref 79–97)
Platelets: 331 10*3/uL (ref 150–450)
RBC: 3.79 x10E6/uL (ref 3.77–5.28)
RDW: 14 % (ref 11.7–15.4)
WBC: 8.4 10*3/uL (ref 3.4–10.8)

## 2022-02-28 LAB — IRON,TIBC AND FERRITIN PANEL
Ferritin: 12 ng/mL — ABNORMAL LOW (ref 15–150)
Iron Saturation: 8 % — CL (ref 15–55)
Iron: 36 ug/dL (ref 27–159)
Total Iron Binding Capacity: 431 ug/dL (ref 250–450)
UIBC: 395 ug/dL (ref 131–425)

## 2022-02-28 LAB — VITAMIN D 25 HYDROXY (VIT D DEFICIENCY, FRACTURES): Vit D, 25-Hydroxy: 14.9 ng/mL — ABNORMAL LOW (ref 30.0–100.0)

## 2022-02-28 MED ORDER — VITAMIN D 25 MCG (1000 UNIT) PO TABS: 1000.0000 [IU] | ORAL_TABLET | Freq: Every day | ORAL | 2 refills | Status: DC

## 2022-02-28 MED ORDER — FERROUS SULFATE 324 (65 FE) MG PO TBEC: 1.0000 | DELAYED_RELEASE_TABLET | ORAL | 1 refills | Status: DC

## 2022-02-28 NOTE — Addendum Note (Signed)
Addended by: Ihor Dow on: 02/28/2022 04:07 PM   Modules accepted: Orders

## 2022-04-04 DIAGNOSIS — I609 Nontraumatic subarachnoid hemorrhage, unspecified: Secondary | ICD-10-CM | POA: Diagnosis not present

## 2022-04-10 ENCOUNTER — Other Ambulatory Visit: Payer: Self-pay | Admitting: Internal Medicine

## 2022-04-16 ENCOUNTER — Telehealth: Payer: Self-pay | Admitting: Genetic Counselor

## 2022-04-16 NOTE — Telephone Encounter (Signed)
I called the patient to let her know that in reviewing her chart, I noticed that she was scheduled in the wrong genetics clinic.  We are the Cancer center, and see patients with personal and family history cancer indications.  I will cancel this appointment to save her from coming in, and will call around and see if I can find a better place for her to be seen.  Patient voiced her understanding.

## 2022-04-17 ENCOUNTER — Other Ambulatory Visit: Payer: BC Managed Care – PPO

## 2022-04-17 ENCOUNTER — Encounter: Payer: BC Managed Care – PPO | Admitting: Genetic Counselor

## 2022-04-18 ENCOUNTER — Other Ambulatory Visit: Payer: Self-pay | Admitting: Internal Medicine

## 2022-04-18 DIAGNOSIS — I608 Other nontraumatic subarachnoid hemorrhage: Secondary | ICD-10-CM

## 2022-05-02 ENCOUNTER — Ambulatory Visit (INDEPENDENT_AMBULATORY_CARE_PROVIDER_SITE_OTHER): Payer: BC Managed Care – PPO | Admitting: Internal Medicine

## 2022-05-02 VITALS — BP 132/86 | Temp 98.1°F | Ht 61.0 in | Wt 217.8 lb

## 2022-05-02 DIAGNOSIS — R519 Headache, unspecified: Secondary | ICD-10-CM

## 2022-05-02 DIAGNOSIS — F419 Anxiety disorder, unspecified: Secondary | ICD-10-CM

## 2022-05-02 DIAGNOSIS — F411 Generalized anxiety disorder: Secondary | ICD-10-CM | POA: Diagnosis not present

## 2022-05-02 DIAGNOSIS — D500 Iron deficiency anemia secondary to blood loss (chronic): Secondary | ICD-10-CM

## 2022-05-02 DIAGNOSIS — R5383 Other fatigue: Secondary | ICD-10-CM | POA: Diagnosis not present

## 2022-05-02 DIAGNOSIS — I608 Other nontraumatic subarachnoid hemorrhage: Secondary | ICD-10-CM

## 2022-05-02 MED ORDER — VITAMIN D 25 MCG (1000 UNIT) PO TABS: 1000.0000 [IU] | ORAL_TABLET | Freq: Every day | ORAL | 2 refills | Status: DC

## 2022-05-02 MED ORDER — SERTRALINE HCL 25 MG PO TABS: 25.0000 mg | ORAL_TABLET | Freq: Every day | ORAL | 2 refills | Status: DC

## 2022-05-02 NOTE — Patient Instructions (Addendum)
Dear Mrs. Owens Shark,  Thank you for trusting [place] with your care.   We discussed your fatigue, headache, and anxiety.  For your fatigue, we will recheck an iron panel and blood level on you today. Please continue taking the iron supplement. Please take the vitamin D supplement as well. We will check your thyroid. Please start taking the sertraline. We will have you meet with [facility]. We will refer you for a sleep study to see if you have sleep apnea.   The headache sounds stable and fortunately does not need to have anything done at this time. If anything does change, please give [place] a call or go to the ED immediately.   For your anxiety, we will try the sertraline. Please make sure you take this for at least 4 weeks before any benefit can be observed. We will also have you meet with Wyatt Portela to discuss your depression and anxiety.  Please return in 1 month.

## 2022-05-02 NOTE — Progress Notes (Signed)
   CC: fatigue  HPI:[name] is a 37 y.o. female who presents for evaluation of fatigue. Please see individual problem based A/P for details.   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    Depression, PHQ-9: Based on the patients  [facility] Visit from 01/02/2022 in [place]  PHQ-9 Total Score 12      score we have 12.  Past Medical History:  Diagnosis Date   Anxiety    Asthma    Depression    Review of Systems:   See hpi  Physical Exam: Vitals:   05/02/22 1427  BP: 132/86  Temp: 98.1 F (36.7 C)  TempSrc: Oral  SpO2: 99%  Weight: 217 lb 12.8 oz (98.8 kg)  Height: 5\' 1"  (1.549 m)   General: nad, mo HEENT: Conjunctiva nl , antiicteric sclerae, moist mucous membranes, no exudate or erythema Cardiovascular: Normal rate, regular rhythm.  No murmurs, rubs, or gallops Pulmonary : Equal breath sounds, No wheezes, rales, or rhonchi Abdominal: soft, nontender,  bowel sounds present Ext: No edema in lower extremities, no tenderness to palpation of lower extremities.   Assessment & Plan:   See Encounters Tab for problem based charting.  Patient discussed with Dr.  Cain Sieve

## 2022-05-04 LAB — CBC WITH DIFFERENTIAL/PLATELET
Basophils Absolute: 0 10*3/uL (ref 0.0–0.2)
Basos: 0 %
EOS (ABSOLUTE): 0.1 10*3/uL (ref 0.0–0.4)
Eos: 1 %
Hematocrit: 35.7 % (ref 34.0–46.6)
Hemoglobin: 11.1 g/dL (ref 11.1–15.9)
Immature Grans (Abs): 0 10*3/uL (ref 0.0–0.1)
Immature Granulocytes: 0 %
Lymphocytes Absolute: 2.7 10*3/uL (ref 0.7–3.1)
Lymphs: 29 %
MCH: 27.3 pg (ref 26.6–33.0)
MCHC: 31.1 g/dL — ABNORMAL LOW (ref 31.5–35.7)
MCV: 88 fL (ref 79–97)
Monocytes Absolute: 0.6 10*3/uL (ref 0.1–0.9)
Monocytes: 7 %
Neutrophils Absolute: 5.9 10*3/uL (ref 1.4–7.0)
Neutrophils: 63 %
Platelets: 329 10*3/uL (ref 150–450)
RBC: 4.07 x10E6/uL (ref 3.77–5.28)
RDW: 16.3 % — ABNORMAL HIGH (ref 11.7–15.4)
WBC: 9.3 10*3/uL (ref 3.4–10.8)

## 2022-05-04 LAB — IRON,TIBC AND FERRITIN PANEL
Ferritin: 20 ng/mL (ref 15–150)
Iron Saturation: 14 % — ABNORMAL LOW (ref 15–55)
Iron: 68 ug/dL (ref 27–159)
Total Iron Binding Capacity: 476 ug/dL — ABNORMAL HIGH (ref 250–450)
UIBC: 408 ug/dL (ref 131–425)

## 2022-05-04 LAB — TSH: TSH: 0.81 u[IU]/mL (ref 0.450–4.500)

## 2022-05-06 ENCOUNTER — Encounter: Payer: Self-pay | Admitting: Internal Medicine

## 2022-05-06 DIAGNOSIS — F419 Anxiety disorder, unspecified: Secondary | ICD-10-CM | POA: Insufficient documentation

## 2022-05-06 DIAGNOSIS — R519 Headache, unspecified: Secondary | ICD-10-CM | POA: Insufficient documentation

## 2022-05-06 DIAGNOSIS — R5383 Other fatigue: Secondary | ICD-10-CM | POA: Insufficient documentation

## 2022-05-06 NOTE — Assessment & Plan Note (Signed)
Patient reports significant anxiety related to [facility] in September. She has used buspar in the past but didn't like it.  Is supposed to be on sertraline, but quit after not noticing much benefit after only 1-2 weeks. She is interested speaking with BH. Also reports feeling down/depressed related to health issues.   PHQ9 16 and GAD7 is 17.   We will trial sertraline for patient. Counseled that she will need to take it for at least 1 month prior to noticing benefit. Will also refer to [facility].

## 2022-05-06 NOTE — Assessment & Plan Note (Signed)
Patient had SAH in September. Reports mild, throbbing ache in middle of forehead. This has been ongoing since [facility], no change in pattern, frequency, or severity. No new neuro symptoms.  Neuro exam without focal deficits.  Headaches sound stable. No further workup. Encouraged patient to continue to monitor and will go to ED if she does have change or new neuro symptoms.

## 2022-05-06 NOTE — Assessment & Plan Note (Addendum)
Patient reports fatigue having to sleep upwards of 11 hours per day. She reports significant anxiety which she believes is contributing to this. She has been taking iron supplements. She has not been taking her vit d supplements.   Patient does have hx of IDA and vit D deficiency. We will recheck iron panel and CBC. Will continue iron supplement and encouraged patient to take her Vit D supplements. TSH wnl. She is intermediate risk for OSA so we will refer for sleep study. Referral to [name] to help with her anxiety as well. She also has depression which we will treat with sertraline.   Addendum: hgb stable, and would not explain her worsening symptoms. Still has iron deficiency, she will need ot be ensure complaince.

## 2022-05-07 NOTE — Progress Notes (Signed)
Internal Medicine Clinic Attending ° °Case discussed with Dr. Gawaluck  At the time of the visit.  We reviewed the resident’s history and exam and pertinent patient test results.  I agree with the assessment, diagnosis, and plan of care documented in the resident’s note.  °

## 2022-05-22 ENCOUNTER — Ambulatory Visit (INDEPENDENT_AMBULATORY_CARE_PROVIDER_SITE_OTHER): Payer: BC Managed Care – PPO | Admitting: Licensed Clinical Social Worker

## 2022-05-22 DIAGNOSIS — F411 Generalized anxiety disorder: Secondary | ICD-10-CM

## 2022-05-22 NOTE — BH Specialist Note (Signed)
Integrated Behavioral Health via Telemedicine Visit  05/22/2022 Joua Vanderkolk [id number]  Number of [place] Clinician visits: 1- Initial Visit  Session Start time: [id number]   Session End time: 1100  Total time in minutes: 30   Referring Provider: Farrel Gordon Patient/Family location: Home [facility] Provider location: [facility] All persons participating in visit: Self Types of Service: Introduction only  I connected with Jessee Avers  via  Telephone and verified that I am speaking with the correct person using two identifiers. Discussed confidentiality: Yes   I discussed the limitations of telemedicine and the availability of in person appointments.  Discussed there is a possibility of technology failure and discussed alternative modes of communication if that failure occurs.   Patient and/or legal guardian expressed understanding and consented to Telemedicine visit: Yes   Presenting Concerns: Patient reports the following symptoms/concerns: Anxiety  Severity of problem: moderate  Patient and/or Family's Strengths/Protective Factors: Concrete supports in place (healthy food, safe environments, etc.)  Goals Addressed: Patient will:  Reduce symptoms of: anxiety    Progress towards Goals: Ongoing  Interventions: Interventions utilized:  Motivational Interviewing and Mindfulness or Relaxation Training Standardized Assessments completed: PHQ-SADS     02/27/2022   12:07 PM 01/02/2022    2:27 PM 01/02/2022    2:18 PM  PHQ-SADS Last 3 Score only  Total GAD-7 Score 19 15   PHQ Adolescent Score   12      Assessment: [place] introduced self to patient and gave patient [facility] contact. Confidentialty reviewed with patient and patient understood. Patient advised she is sufferin from anxiety. Patient admitted to having a stroke and losing her dad in 10. Patient advised she would benefit from a video visit. [facility] scheduled patient for a video visit on 03/06 at 1:30pm. [facility]  advised patient to listen to music and meditation sounds as a technique to calming her anxiety.    Patient may benefit from Motivational Interviewing and Mindfulness or Relaxation Training  Plan: Follow up with behavioral health clinician on : 05/30/2022   I discussed the assessment and treatment plan with the patient and/or parent/guardian. They were provided an opportunity to ask questions and all were answered. They agreed with the plan and demonstrated an understanding of the instructions.   They were advised to call back or seek an in-person evaluation if the symptoms worsen or if the condition fails to improve as anticipated.  Milus Height, MSW, [facility] Direct Dial:[phone]  Fax [phone] Main Office Phone: [phone] [address] [place],  [address] Website: [place], [place]

## 2022-05-23 DIAGNOSIS — I609 Nontraumatic subarachnoid hemorrhage, unspecified: Secondary | ICD-10-CM | POA: Diagnosis not present

## 2022-05-26 ENCOUNTER — Emergency Department (HOSPITAL_COMMUNITY): Payer: BC Managed Care – PPO

## 2022-05-26 ENCOUNTER — Encounter (HOSPITAL_COMMUNITY): Payer: Self-pay | Admitting: Emergency Medicine

## 2022-05-26 ENCOUNTER — Other Ambulatory Visit: Payer: Self-pay

## 2022-05-26 ENCOUNTER — Emergency Department (HOSPITAL_COMMUNITY)
Admission: EM | Admit: 2022-05-26 | Discharge: 2022-05-26 | Disposition: A | Discharge status: Home / Self Care | Payer: BC Managed Care – PPO | Attending: Emergency Medicine | Admitting: Emergency Medicine

## 2022-05-26 DIAGNOSIS — J45909 Unspecified asthma, uncomplicated: Secondary | ICD-10-CM | POA: Diagnosis not present

## 2022-05-26 DIAGNOSIS — K921 Melena: Secondary | ICD-10-CM

## 2022-05-26 DIAGNOSIS — Z91040 Latex allergy status: Secondary | ICD-10-CM | POA: Diagnosis not present

## 2022-05-26 DIAGNOSIS — R1013 Epigastric pain: Secondary | ICD-10-CM | POA: Insufficient documentation

## 2022-05-26 DIAGNOSIS — R519 Headache, unspecified: Secondary | ICD-10-CM

## 2022-05-26 DIAGNOSIS — R109 Unspecified abdominal pain: Secondary | ICD-10-CM | POA: Diagnosis not present

## 2022-05-26 LAB — CBC WITH DIFFERENTIAL/PLATELET
Abs Immature Granulocytes: 0.03 10*3/uL (ref 0.00–0.07)
Basophils Absolute: 0 10*3/uL (ref 0.0–0.1)
Basophils Relative: 0 %
Eosinophils Absolute: 0.2 10*3/uL (ref 0.0–0.5)
Eosinophils Relative: 2 %
HCT: 34 % — ABNORMAL LOW (ref 36.0–46.0)
Hemoglobin: 10.7 g/dL — ABNORMAL LOW (ref 12.0–15.0)
Immature Granulocytes: 0 %
Lymphocytes Relative: 25 %
Lymphs Abs: 2.5 10*3/uL (ref 0.7–4.0)
MCH: 27.7 pg (ref 26.0–34.0)
MCHC: 31.5 g/dL (ref 30.0–36.0)
MCV: 88.1 fL (ref 80.0–100.0)
Monocytes Absolute: 0.8 10*3/uL (ref 0.1–1.0)
Monocytes Relative: 8 %
Neutro Abs: 6.4 10*3/uL (ref 1.7–7.7)
Neutrophils Relative %: 65 %
Platelets: 309 10*3/uL (ref 150–400)
RBC: 3.86 MIL/uL — ABNORMAL LOW (ref 3.87–5.11)
RDW: 18.6 % — ABNORMAL HIGH (ref 11.5–15.5)
WBC: 9.9 10*3/uL (ref 4.0–10.5)
nRBC: 0 % (ref 0.0–0.2)

## 2022-05-26 LAB — COMPREHENSIVE METABOLIC PANEL
ALT: 24 U/L (ref 0–44)
AST: 29 U/L (ref 15–41)
Albumin: 3.7 g/dL (ref 3.5–5.0)
Alkaline Phosphatase: 60 U/L (ref 38–126)
Anion gap: 8 (ref 5–15)
BUN: 11 mg/dL (ref 6–20)
CO2: 23 mmol/L (ref 22–32)
Calcium: 8.6 mg/dL — ABNORMAL LOW (ref 8.9–10.3)
Chloride: 105 mmol/L (ref 98–111)
Creatinine, Ser: 0.77 mg/dL (ref 0.44–1.00)
GFR, Estimated: >60 mL/min (ref >60)
Glucose, Bld: 120 mg/dL — ABNORMAL HIGH (ref 70–99)
Potassium: 3.1 mmol/L — ABNORMAL LOW (ref 3.5–5.1)
Sodium: 136 mmol/L (ref 135–145)
Total Bilirubin: 0.4 mg/dL (ref 0.3–1.2)
Total Protein: 6.7 g/dL (ref 6.5–8.1)

## 2022-05-26 LAB — I-STAT BETA HCG BLOOD, ED (MC, WL, AP ONLY): I-stat hCG, quantitative: 9.6 m[IU]/mL — ABNORMAL HIGH (ref <5)

## 2022-05-26 LAB — POC OCCULT BLOOD, ED: Fecal Occult Bld: NEGATIVE

## 2022-05-26 LAB — TYPE AND SCREEN
ABO/RH(D): O POS
Antibody Screen: NEGATIVE

## 2022-05-26 LAB — TROPONIN I (HIGH SENSITIVITY)
Troponin I (High Sensitivity): 2 ng/L (ref <18)
Troponin I (High Sensitivity): 2 ng/L (ref <18)

## 2022-05-26 MED ORDER — FAMOTIDINE 20 MG PO TABS: 20.0000 mg | ORAL_TABLET | Freq: Two times a day (BID) | ORAL | 0 refills | Status: DC

## 2022-05-26 MED ORDER — ACETAMINOPHEN 325 MG PO TABS: 650.0000 mg | ORAL_TABLET | Freq: Four times a day (QID) | ORAL | Status: DC | PRN

## 2022-05-26 MED ORDER — BARIUM SULFATE 2 % PO SUSP
450.0000 mL | Freq: Once | ORAL | Status: AC
  Administered 2022-05-26: 450 mL via ORAL

## 2022-05-26 MED ORDER — ONDANSETRON 4 MG PO TBDP
4.0000 mg | ORAL_TABLET | Freq: Once | ORAL | Status: AC
  Administered 2022-05-26: 4 mg via ORAL
  Filled 2022-05-26: qty 1

## 2022-05-26 MED ORDER — BARIUM SULFATE 2 % PO SUSP: 450.0000 mL | Freq: Once | ORAL | Status: AC

## 2022-05-26 MED ORDER — BARIUM SULFATE 2 % PO SUSP
ORAL | Status: AC
  Administered 2022-05-26: 450 mL via ORAL
  Filled 2022-05-26: qty 2

## 2022-05-26 NOTE — ED Notes (Signed)
AVS reviewed with pt prior to discharge. Pt verbalizes understanding. Belongings with pt upon depart. Ambulatory to POV by self

## 2022-05-26 NOTE — ED Notes (Signed)
Urine sent. No order in, but it there if needed

## 2022-05-26 NOTE — ED Triage Notes (Addendum)
Patient here c/o of bright red bleeding in stool x2 occurances noticing this yesterday and today. No hx of hemorrhoids per patient. Also complains of shortness of breath and a sharp epigastric pain over to the L side and radiating underneath her L breast ongoing intermittently for one month. Patient states she had a stroke in September of 21, and concerned about the bleeding she's experiencing with bowel movements. Tearful in triage.

## 2022-05-26 NOTE — ED Provider Triage Note (Signed)
Emergency Medicine Provider Triage Evaluation Note  Kerry Chavez , a 37 y.o. female  was evaluated in triage.  Pt complains of left chest pain and SOB and BRBPR  Review of Systems  Positive: Chest pain  Negative:  fever   Physical Exam  BP (!) 149/94   Pulse (!) 113   Temp 98.1 F (36.7 C) (Oral)   Resp 18   SpO2 98%  Gen:   Awake, no distress   Resp:  Normal effort  MSK:   Moves extremities without difficulty  Other:  NCAT  Medical Decision Making  Medically screening exam initiated at 6:21 AM.  Appropriate orders placed.  Kerry Chavez was informed that the remainder of the evaluation will be completed by another provider, this initial triage assessment does not replace that evaluation, and the importance of remaining in the ED until their evaluation is complete.     Kerry Sankey, MD 05/26/22 [id number]

## 2022-05-26 NOTE — Discharge Instructions (Addendum)
Please take your medications as prescribed. Take tylenol/ibuprofen for pain. I recommend close follow-up with PCP for reevaluation.  Please do not hesitate to return to emergency department if worrisome signs symptoms we discussed become apparent.

## 2022-05-26 NOTE — ED Provider Notes (Signed)
[facility] Provider Note   CSN: [id number] Arrival date & time: 05/26/22  [id number]     History  Chief Complaint  Patient presents with   Shortness of Breath   Rectal Bleeding   Abdominal Pain    Kerry Chavez is a 37 y.o. female with a past medical history of anxiety, asthma, depression presenting today for evaluation of bloody stool, abdominal pain and dizziness.  Patient reports 2 episodes of bright red blood in her stools.  She states she also saw blood clots when she wipes.  She reports intermittent epigastric abdominal pain that has been going on for a month.  Pain is dull, intermittent, located in the epigastrium and radiates to the left chest.  Patient has history of brain aneurysm in September 2023 status post aneurysm coiling.  She endorses nausea without vomiting.  She reports mild headache with some dizziness.  She denies any urinary symptoms, abnormal vaginal discharge, bowel changes.  LMP was a week ago.  History of bilateral tubal ligation.   Shortness of Breath Associated symptoms: abdominal pain   Rectal Bleeding Associated symptoms: abdominal pain   Abdominal Pain Associated symptoms: hematochezia and shortness of breath     Past Medical History:  Diagnosis Date   Anxiety    Asthma    Depression    Past Surgical History:  Procedure Laterality Date   CESAREAN SECTION     CESAREAN SECTION WITH BILATERAL TUBAL LIGATION Bilateral 06/12/2014   Procedure: Repeat CESAREAN SECTION WITH BILATERAL TUBAL LIGATION;  Surgeon: Servando Salina, MD;  Location: [facility];  Service: Obstetrics;  Laterality: Bilateral;  EDD: 06/19/14 Wants clear C-Section drape for this case   IR ANGIO INTRA EXTRACRAN SEL INTERNAL CAROTID UNI L MOD SED  12/21/2021   IR ANGIO INTRA EXTRACRAN SEL INTERNAL CAROTID UNI R MOD SED  12/21/2021   IR ANGIO VERTEBRAL SEL VERTEBRAL BILAT MOD SED  12/21/2021   IR ANGIOGRAM FOLLOW UP STUDY  12/21/2021   IR ANGIOGRAM  FOLLOW UP STUDY  12/21/2021   IR TRANSCATH/EMBOLIZ  12/21/2021   RADIOLOGY WITH ANESTHESIA N/A 12/21/2021   Procedure: IR WITH ANESTHESIA;  Surgeon: Consuella Lose, MD;  Location: [facility];  Service: Radiology;  Laterality: N/A;   TONSILLECTOMY AND ADENOIDECTOMY  1998     Home Medications Prior to Admission medications   Medication Sig Start Date End Date Taking? Authorizing Provider  diphenhydrAMINE (BENADRYL) 25 MG tablet Take 25 mg by mouth every 6 (six) hours as needed for sleep (anxiety).   Yes [provider]  diphenhydramine-acetaminophen (TYLENOL PM) 25-500 MG TABS tablet Take 1 tablet by mouth at bedtime as needed (headache pain).   Yes [provider]  famotidine (PEPCID) 20 MG tablet Take 1 tablet (20 mg total) by mouth 2 (two) times daily. 05/26/22  Yes Rex Kras, PA  ferrous sulfate 324 (65 Fe) MG TBEC Take 1 tablet (325 mg total) by mouth every other day. Patient taking differently: Take 2 tablets by mouth daily. 02/28/22  Yes Farrel Gordon, DO  hydrOXYzine (ATARAX) 25 MG tablet TAKE 1 TABLET(25 MG) BY MOUTH THREE TIMES DAILY AS NEEDED Patient taking differently: Take 25 mg by mouth 3 (three) times daily as needed for anxiety. 04/14/22  Yes Farrel Gordon, DO  cholecalciferol (VITAMIN D3) 25 MCG (1000 UNIT) tablet Take 1 tablet (1,000 Units total) by mouth daily. Patient not taking: Reported on 05/26/2022 05/02/22   Delene Ruffini, MD  sertraline (ZOLOFT) 25 MG tablet Take 1 tablet (  25 mg total) by mouth daily. Patient not taking: Reported on 05/26/2022 05/02/22 05/02/23  Delene Ruffini, MD  citalopram (CELEXA) 20 MG tablet Take 1 tablet (20 mg total) by mouth daily. 01/25/20 06/18/20  Benay Pike, MD      Allergies    Shellfish allergy, Ivp dye [iodinated contrast media], Penicillins, Sulfamethoxazole, and Latex    Review of Systems   Review of Systems  Respiratory:  Positive for shortness of breath.   Gastrointestinal:  Positive for abdominal pain and hematochezia.     Physical Exam Updated Vital Signs BP 109/62 (BP Location: Right Arm)   Pulse 98   Temp 98.1 F (36.7 C) (Oral)   Resp 18   SpO2 100%  Physical Exam Vitals and nursing note reviewed.  Constitutional:      Appearance: Normal appearance.  HENT:     Head: Normocephalic and atraumatic.     Mouth/Throat:     Mouth: Mucous membranes are moist.  Eyes:     General: No scleral icterus. Cardiovascular:     Rate and Rhythm: Normal rate and regular rhythm.     Pulses: Normal pulses.     Heart sounds: Normal heart sounds.  Pulmonary:     Effort: Pulmonary effort is normal.     Breath sounds: Normal breath sounds.  Abdominal:     General: Abdomen is flat.     Palpations: Abdomen is soft.     Tenderness: There is no abdominal tenderness.     Comments: TTP to epigastrium.  Musculoskeletal:        General: No deformity.  Skin:    General: Skin is warm.     Findings: No rash.  Neurological:     General: No focal deficit present.     Mental Status: She is alert.  Psychiatric:        Mood and Affect: Mood normal.     ED Results / Procedures / Treatments   Labs (all labs ordered are listed, but only abnormal results are displayed) Labs Reviewed  CBC WITH DIFFERENTIAL/PLATELET - Abnormal; Notable for the following components:      Result Value   RBC 3.86 (*)    Hemoglobin 10.7 (*)    HCT 34.0 (*)    RDW 18.6 (*)    All other components within normal limits  COMPREHENSIVE METABOLIC PANEL - Abnormal; Notable for the following components:   Potassium 3.1 (*)    Glucose, Bld 120 (*)    Calcium 8.6 (*)    All other components within normal limits  I-STAT BETA HCG BLOOD, ED (MC, WL, AP ONLY) - Abnormal; Notable for the following components:   I-stat hCG, quantitative 9.6 (*)    All other components within normal limits  POC OCCULT BLOOD, ED  TYPE AND SCREEN  TROPONIN I (HIGH SENSITIVITY)  TROPONIN I (HIGH SENSITIVITY)    EKG EKG Interpretation  Date/Time:  Saturday  May 26 2022 06:04:07 EST Ventricular Rate:  114 PR Interval:  154 QRS Duration: 74 QT Interval:  290 QTC Calculation: 399 R Axis:   60 Text Interpretation: Sinus tachycardia Confirmed by Randal Buba, April ([id number]) on 05/26/2022 6:22:44 AM  Radiology CT ABDOMEN PELVIS WO CONTRAST  Result Date: 05/26/2022 CLINICAL DATA:  Epigastric abdominal pain, bloody stools EXAM: CT ABDOMEN AND PELVIS WITHOUT CONTRAST TECHNIQUE: Multidetector CT imaging of the abdomen and pelvis was performed following the standard protocol without IV contrast. Oral enteric contrast was administered. RADIATION DOSE REDUCTION: This exam was performed according to the  departmental dose-optimization program which includes automated exposure control, adjustment of the mA and/or kV according to patient size and/or use of iterative reconstruction technique. COMPARISON:  None Available. FINDINGS: Lower chest: No acute abnormality. Dependent bibasilar scarring or atelectasis. Hepatobiliary: No solid liver abnormality is seen. No gallstones, gallbladder wall thickening, or biliary dilatation. Pancreas: Unremarkable. No pancreatic ductal dilatation or surrounding inflammatory changes. Spleen: Normal in size without significant abnormality. Adrenals/Urinary Tract: Adrenal glands are unremarkable. Kidneys are normal, without renal calculi, solid lesion, or hydronephrosis. Bladder is unremarkable. Stomach/Bowel: Stomach is within normal limits. Appendix appears normal. No evidence of bowel wall thickening, distention, or inflammatory changes. Vascular/Lymphatic: No significant vascular findings are present. No enlarged abdominal or pelvic lymph nodes. Reproductive: No mass or other significant abnormality. Other: No abdominal wall hernia or abnormality. No ascites. Musculoskeletal: No acute or significant osseous findings. IMPRESSION: No acute noncontrast CT findings of the abdomen or pelvis to explain epigastric pain or bloody stools. Electronically  Signed   By: Delanna Ahmadi M.D.   On: 05/26/2022 11:32   DG Chest Portable 1 View  Result Date: 05/26/2022 CLINICAL DATA:  Bright red stool for 2 days. EXAM: PORTABLE CHEST 1 VIEW COMPARISON:  None Available. FINDINGS: Normal heart size and mediastinal contours. No acute infiltrate or edema. No effusion or pneumothorax. No acute osseous findings. IMPRESSION: No active disease. Electronically Signed   By: Jorje Guild M.D.   On: 05/26/2022 06:34    Procedures Procedures    Medications Ordered in ED Medications  acetaminophen (TYLENOL) tablet 650 mg (has no administration in time range)  ondansetron (ZOFRAN-ODT) disintegrating tablet 4 mg (4 mg Oral Given 05/26/22 0957)  barium (READI-CAT 2) 2 % suspension 450 mL (450 mLs Oral Given 05/26/22 0930)  barium (READI-CAT 2) 2 % suspension 450 mL (450 mLs Oral Given 05/26/22 0830)    ED Course/ Medical Decision Making/ A&P             HEART Score: 1                Medical Decision Making Amount and/or Complexity of Data Reviewed Labs: ordered. Radiology: ordered.  Risk OTC drugs. Prescription drug management.   This patient presents to the ED for bloody stool, epigastric pain, this involves an extensive number of treatment options, and is a complaint that carries with a high risk of complications and morbidity.  The differential diagnosis includes hemorrhoids, diverticulosis, diverticulitis, appendicitis, PUD, ACS/MI, infectious etiology.  This is not an exhaustive list.  Lab tests: I ordered and personally interpreted labs.  The pertinent results include: WBC unremarkable. Hbg unremarkable. Platelets unremarkable. Electrolytes potassium 3.1. BUN, creatinine unremarkable.  Delta trop negative.  Imaging studies: I ordered imaging studies. I personally reviewed, interpreted imaging and agree with the radiologist's interpretations. The results include: Chest x-ray negative.  Problem list/ ED course/ Critical interventions/ Medical  management: HPI: See above Vital signs within normal range and stable throughout visit. Laboratory/imaging studies significant for: See above. On physical examination, patient is afebrile and appears in no acute distress. Exam without evidence of volume overload so doubt heart failure. EKG without signs of active ischemia. Given the timing of pain to ER presentation, delta troponin was negative so doubt NSTEMI. Presentation not consistent with acute PE ([facility]s low risk), pneumothorax (not visualized on chest xr), thoracic aortic dissection, pericarditis, tamponade, pneumonia (no infectious symptoms, clear chest xr), myocarditis (no recent illness, neg trop). HEART score:1.  Fecal Hemoccult negative.  CBC with no evidence of anemia.  CT scan did not show any acute abdominal abnormalities.  Unclear etiology of patient's symptoms, possibly due to constipation or iron supplements intake.  Patient is stable at the moment and is safe for discharge. Advised patient to take Tylenol/ibuprofen for pain, follow-up with primary care physician and gastroenterology for further evaluation and management, return to the ER if new or worsening symptoms.  I have reviewed the patient home medicines and have made adjustments as needed.  Cardiac monitoring/EKG: The patient was maintained on a cardiac monitor.  I personally reviewed and interpreted the cardiac monitor which showed an underlying rhythm of: sinus rhythm.  Additional history obtained: External records from outside source obtained and reviewed including: Chart review including previous notes, labs, imaging.  Disposition Continued outpatient therapy. Follow-up with PCP and gastroenterology recommended for reevaluation of symptoms. Treatment plan discussed with patient.  Pt acknowledged understanding was agreeable to the plan. Worrisome signs and symptoms were discussed with patient, and patient acknowledged understanding to return to the ED if they noticed these  signs and symptoms. Patient was stable upon discharge.   This chart was dictated using voice recognition software.  Despite best efforts to proofread,  errors can occur which can change the documentation meaning.          Final Clinical Impression(s) / ED Diagnoses Final diagnoses:  Epigastric pain  Blood in stool  Nonintractable headache, unspecified chronicity pattern, unspecified headache type    Rx / DC Orders ED Discharge Orders          Ordered    famotidine (PEPCID) 20 MG tablet  2 times daily        05/26/22 1201              Rex Kras, [place] 05/26/22 1434    Carmin Muskrat, MD 05/26/22 1510

## 2022-05-29 ENCOUNTER — Other Ambulatory Visit: Payer: Self-pay | Admitting: Neurosurgery

## 2022-05-29 DIAGNOSIS — I609 Nontraumatic subarachnoid hemorrhage, unspecified: Secondary | ICD-10-CM

## 2022-05-30 ENCOUNTER — Other Ambulatory Visit: Payer: Self-pay | Admitting: Neurosurgery

## 2022-05-30 ENCOUNTER — Ambulatory Visit (INDEPENDENT_AMBULATORY_CARE_PROVIDER_SITE_OTHER): Payer: BC Managed Care – PPO | Admitting: Licensed Clinical Social Worker

## 2022-05-30 DIAGNOSIS — F411 Generalized anxiety disorder: Secondary | ICD-10-CM

## 2022-05-30 NOTE — BH Specialist Note (Signed)
Integrated Behavioral Health via Telemedicine Visit  05/30/2022 Kerry Chavez [id number]  Number of [place] Clinician visits: 2- Second Visit  Session Start time: [id number]   Session End time: 1430  Total time in minutes: 60   Referring Provider: Farrel Gordon Patient/Family location: Home [facility] Provider location: [facility] All persons participating in visit: Patient only Types of Service: Individual psychotherapy and Video visit  I connected with Kerry Chavez  Video Enabled Telemedicine Application  (Video is Caregility application) and verified that I am speaking with the correct person using two identifiers. Discussed confidentiality: Yes   I discussed the limitations of telemedicine and the availability of in person appointments.  Discussed there is a possibility of technology failure and discussed alternative modes of communication if that failure occurs.  I discussed that engaging in this telemedicine visit, they consent to the provision of behavioral healthcare.  Patient and/or legal guardian expressed understanding and consented to Telemedicine visit: Yes   Goals Addressed: Patient will:  Reduce symptoms of: anxiety    Progress towards Goals: Ongoing  Interventions: Interventions utilized:  Motivational Interviewing, Solution-Focused Strategies, and Mindfulness or Relaxation Training Standardized Assessments completed: PHQ-SADS     05/30/2022    1:33 PM 02/27/2022   12:07 PM 01/02/2022    2:27 PM  PHQ-SADS Last 3 Score only  Total GAD-7 Score '14 19 15      '$ Assessment: Patient currently experiencing anxiety due to upcoming medical appointments and stresses of being caregiver. [facility] and patient discussed self care. Patient agreed to take time on 03/07 to have a moment of self care. [facility] and patient discussed upcoming medical appointments and strategies to cope with feelings of worry. [facility] discussed breathing techniques and listening to music when feeling  overwhelmed. Patient enjoyed the video visit and felt a sense of relief after speaking with [facility]. Patient agreed to additional follow up appointments with [facility].   [facility] completed updated anxiety assessment. A depression screening will need to be completed for next session.   Patient may benefit from Motivational Interviewing, Solution-Focused Strategies, and Mindfulness or Psychologist, educational.  Plan: Follow up with behavioral health clinician on : within 30 days.   I discussed the assessment and treatment plan with the patient and/or parent/guardian. They were provided an opportunity to ask questions and all were answered. They agreed with the plan and demonstrated an understanding of the instructions.   They were advised to call back or seek an in-person evaluation if the symptoms worsen or if the condition fails to improve as anticipated.  Milus Height, MSW, [facility] Direct [phone]  Fax [phone] Main Office Phone: [phone] [address] [place], [place] [address] Website: [place], [place]

## 2022-06-04 ENCOUNTER — Encounter: Payer: Self-pay | Admitting: Student

## 2022-06-04 ENCOUNTER — Other Ambulatory Visit: Payer: Self-pay

## 2022-06-04 ENCOUNTER — Ambulatory Visit (INDEPENDENT_AMBULATORY_CARE_PROVIDER_SITE_OTHER): Payer: BC Managed Care – PPO | Admitting: Student

## 2022-06-04 VITALS — BP 116/73 | HR 98 | Temp 98.4°F | Resp 24 | Ht 61.0 in | Wt 219.6 lb

## 2022-06-04 DIAGNOSIS — K644 Residual hemorrhoidal skin tags: Secondary | ICD-10-CM | POA: Diagnosis not present

## 2022-06-04 DIAGNOSIS — N939 Abnormal uterine and vaginal bleeding, unspecified: Secondary | ICD-10-CM

## 2022-06-04 DIAGNOSIS — D500 Iron deficiency anemia secondary to blood loss (chronic): Secondary | ICD-10-CM | POA: Diagnosis not present

## 2022-06-04 DIAGNOSIS — Z0000 Encounter for general adult medical examination without abnormal findings: Secondary | ICD-10-CM

## 2022-06-04 HISTORY — DX: Residual hemorrhoidal skin tags: K64.4

## 2022-06-04 MED ORDER — FERROUS SULFATE 324 (65 FE) MG PO TBEC: 1.0000 | DELAYED_RELEASE_TABLET | ORAL | 1 refills | Status: DC

## 2022-06-04 MED ORDER — HYDROXYZINE HCL 25 MG PO TABS: 25.0000 mg | ORAL_TABLET | Freq: Three times a day (TID) | ORAL | 3 refills | Status: DC | PRN

## 2022-06-04 NOTE — Patient Instructions (Addendum)
It was a pleasure seeing you in clinic today.  I will make a referral to OB/GYN to discuss menstrual bleeding further.  Please continue iron supplementation, 1 tab every other day You can take fiber, miralax, and senna as needed over the counter to help with constipation.  Follow up in 3 months

## 2022-06-04 NOTE — Assessment & Plan Note (Signed)
Stop taking her iron supplements due to constipation and 2 episodes of bloody stool last week.  She was taking 2 tablets of ferrous sulfate daily due to concern for her iron deficiency.  No further bleeding since then.  Denies family history of colon cancer.  Discussed every other day dosing of her ferrous sulfate to prevent constipation.  She can use MiraLAX and senna over-the-counter as needed for constipation.

## 2022-06-04 NOTE — Assessment & Plan Note (Addendum)
Patient reports history of heavy menstrual cycles for the past 8 years since her last pregnancy and tubal ligation.  Periods are normal every 30 days and last 5 to 7 days.  Last LMP was around 05/15/2022.  No irregular bleeding. She denies any recent changes in her menstrual cycle but is concerned due to her iron deficiency.  Family history of paternal aunt with pelvic malignancy diagnosed in her 69s.  I-stat bHCG elevated at 9. She denies any sexual activity in the last 3 months suspect this is a false positive. She is interested in OCPs to help with heavy bleeding.  Given her history of SAH and change in menstrual cycle will refer her to OB/GYN for further evaluation regarding this.

## 2022-06-04 NOTE — Assessment & Plan Note (Signed)
Patient presents today for Pap smear however appears she had normal cytology and HPV testing on 04/30/2018.  She is not due for her next Pap smear until next year.  She will reschedule next year.

## 2022-06-04 NOTE — Progress Notes (Signed)
Established Patient Office Visit  Subjective   Patient ID: Kerry Chavez, female    DOB: 12/25/1985  Age: 37 y.o. MRN: [id number]  Chief Complaint  Patient presents with   Gynecologic Exam    Kerry Chavez is a 37 y.o. person living with a history listed below who presents to clinic for heavy menstrual bleeding. Please refer to problem based charting for further details and assessment and plan of current problem and chronic medical conditions.    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 Negative for chills and fever.  Gastrointestinal:  Negative for abdominal pain, blood in stool and constipation.  Genitourinary:        Menorrhagia   All other systems reviewed and are negative.     Objective:     BP 116/73 (BP Location: Left Arm, Patient Position: Sitting, Cuff Size: Large)   Pulse 98   Temp 98.4 F (36.9 C) (Oral)   Resp (!) 24   Ht '5\' 1"'$  (1.549 m)   Wt 219 lb 9.6 oz (99.6 kg)   LMP 05/15/2022   SpO2 100% Comment: room air  BMI 41.49 kg/m  BP Readings from Last 3 Encounters:  06/04/22 116/73  05/26/22 (!) 123/95  05/02/22 132/86      Physical Exam Exam conducted with a chaperone present.  Constitutional:      Appearance: Normal appearance.  HENT:     Mouth/Throat:     Mouth: Mucous membranes are moist.     Pharynx: Oropharynx is clear.  Eyes:     Extraocular Movements: Extraocular movements intact.     Pupils: Pupils are equal, round, and reactive to  light.  Cardiovascular:     Rate and Rhythm: Normal rate and regular rhythm.     Heart sounds: No murmur heard. Pulmonary:     Effort: Pulmonary effort is normal.     Breath sounds: No rhonchi or rales.  Abdominal:     General: Abdomen is flat. Bowel sounds are normal. There is no distension.     Palpations: Abdomen is soft.     Tenderness: There is no abdominal tenderness.  Genitourinary:    Exam position: Supine.     Labia:        Right: No rash or tenderness.        Left: No rash or tenderness.      Rectum: External hemorrhoid present. No tenderness, anal fissure or internal hemorrhoid.     Comments: Skin tag superior to the clitoral hood Musculoskeletal:        General: Normal range of motion.     Right lower leg: No edema.     Left lower leg: No edema.  Skin:    General: Skin is warm and dry.     Capillary Refill: Capillary refill takes less than 2 seconds.  Neurological:     General: No focal deficit present.     Mental Status: She is alert and oriented to person, place, and time.  Psychiatric:  Mood and Affect: Mood normal.        Behavior: Behavior normal.      No results found for any visits on 06/04/22.  Last CBC Lab Results  Component Value Date   WBC 9.9 05/26/2022   HGB 10.7 (L) 05/26/2022   HCT 34.0 (L) 05/26/2022   MCV 88.1 05/26/2022   MCH 27.7 05/26/2022   RDW 18.6 (H) 05/26/2022   PLT 309 05/26/2022      The ASCVD Risk score (Arnett DK, et al., 2019) failed to calculate for the following reasons:   The 2019 ASCVD risk score is only valid for ages 73 to 41    Assessment & Plan:   Problem List Items Addressed This Visit       Cardiovascular and Mediastinum   Bleeding external hemorrhoids    Patient presented to ED 1 week ago for 2 episodes of bloody stool in the setting of constipation from oral iron supplementation.  No bleeding since then.  She is drinking prune juice and stopped her iron supplements and constipation has since  resolved.  CT reassuring. On exam she does appear to have external hemorrhoids without signs of bleeding.  Discussed bowel regimen and taking iron every other day to prevent constipation and further bleeding. See iron deficiency anemia for further details.        Genitourinary   Abnormal uterine bleeding (AUB)    Patient reports history of heavy menstrual cycles for the past 8 years since her last pregnancy and tubal ligation.  Periods are normal every 30 days and last 5 to 7 days.  Last LMP was around 05/15/2022.  No irregular bleeding. She denies any recent changes in her menstrual cycle but is concerned due to her iron deficiency.  Family history of paternal aunt with pelvic malignancy diagnosed in her 33s.  I-stat bHCG elevated at 9. She denies any sexual activity in the last 3 months suspect this is a false positive. She is interested in OCPs to help with heavy bleeding.  Given her history of SAH and change in menstrual cycle will refer her to OB/GYN for further evaluation regarding this.        Other   Iron deficiency anemia due to chronic blood loss    Stop taking her iron supplements due to constipation and 2 episodes of bloody stool last week.  She was taking 2 tablets of ferrous sulfate daily due to concern for her iron deficiency.  No further bleeding since then.  Denies family history of colon cancer.  Discussed every other day dosing of her ferrous sulfate to prevent constipation.  She can use MiraLAX and senna over-the-counter as needed for constipation.      Relevant Medications   ferrous sulfate 324 (65 Fe) MG TBEC   Healthcare maintenance    Patient presents today for Pap smear however appears she had normal cytology and HPV testing on 04/30/2018.  She is not due for her next Pap smear until next year.  She will reschedule next year.      Other Visit Diagnoses     Abnormal uterine bleeding    -  Primary   Relevant Orders   Ambulatory referral to Obstetrics / Gynecology        Return in about 3 months (around 09/04/2022).    Iona Beard, MD

## 2022-06-04 NOTE — Assessment & Plan Note (Addendum)
Patient presented to ED 1 week ago for 2 episodes of bloody stool in the setting of constipation from oral iron supplementation.  No bleeding since then.  She is drinking prune juice and stopped her iron supplements and constipation has since resolved.  CT reassuring. On exam she does appear to have external hemorrhoids without signs of bleeding.  Discussed bowel regimen and taking iron every other day to prevent constipation and further bleeding. See iron deficiency anemia for further details.

## 2022-06-04 NOTE — Addendum Note (Signed)
Addended by: Iona Beard on: 06/04/2022 02:11 PM   Modules accepted: Orders

## 2022-06-06 DIAGNOSIS — H5340 Unspecified visual field defects: Secondary | ICD-10-CM | POA: Diagnosis not present

## 2022-06-07 ENCOUNTER — Other Ambulatory Visit: Payer: Self-pay | Admitting: Internal Medicine

## 2022-06-07 ENCOUNTER — Institutional Professional Consult (permissible substitution): Payer: BC Managed Care – PPO | Admitting: Neurology

## 2022-06-08 ENCOUNTER — Other Ambulatory Visit: Payer: Self-pay

## 2022-06-08 ENCOUNTER — Ambulatory Visit (HOSPITAL_COMMUNITY)
Admission: RE | Admit: 2022-06-08 | Discharge: 2022-06-08 | Disposition: A | Discharge status: Home / Self Care | Payer: BC Managed Care – PPO | Source: Ambulatory Visit | Attending: Neurosurgery | Admitting: Neurosurgery

## 2022-06-08 DIAGNOSIS — I671 Cerebral aneurysm, nonruptured: Secondary | ICD-10-CM | POA: Diagnosis not present

## 2022-06-08 DIAGNOSIS — Z87891 Personal history of nicotine dependence: Secondary | ICD-10-CM | POA: Insufficient documentation

## 2022-06-08 DIAGNOSIS — Z539 Procedure and treatment not carried out, unspecified reason: Secondary | ICD-10-CM | POA: Diagnosis not present

## 2022-06-08 DIAGNOSIS — I609 Nontraumatic subarachnoid hemorrhage, unspecified: Secondary | ICD-10-CM

## 2022-06-08 LAB — CBC WITH DIFFERENTIAL/PLATELET
Abs Immature Granulocytes: 0.02 10*3/uL (ref 0.00–0.07)
Basophils Absolute: 0 10*3/uL (ref 0.0–0.1)
Basophils Relative: 0 %
Eosinophils Absolute: 0.1 10*3/uL (ref 0.0–0.5)
Eosinophils Relative: 1 %
HCT: 34.9 % — ABNORMAL LOW (ref 36.0–46.0)
Hemoglobin: 11.1 g/dL — ABNORMAL LOW (ref 12.0–15.0)
Immature Granulocytes: 0 %
Lymphocytes Relative: 18 %
Lymphs Abs: 1.6 10*3/uL (ref 0.7–4.0)
MCH: 27.4 pg (ref 26.0–34.0)
MCHC: 31.8 g/dL (ref 30.0–36.0)
MCV: 86.2 fL (ref 80.0–100.0)
Monocytes Absolute: 0.6 10*3/uL (ref 0.1–1.0)
Monocytes Relative: 6 %
Neutro Abs: 6.8 10*3/uL (ref 1.7–7.7)
Neutrophils Relative %: 75 %
Platelets: 316 10*3/uL (ref 150–400)
RBC: 4.05 MIL/uL (ref 3.87–5.11)
RDW: 18.3 % — ABNORMAL HIGH (ref 11.5–15.5)
WBC: 9.1 10*3/uL (ref 4.0–10.5)
nRBC: 0 % (ref 0.0–0.2)

## 2022-06-08 LAB — URINALYSIS, ROUTINE W REFLEX MICROSCOPIC
Bilirubin Urine: NEGATIVE
Glucose, UA: NEGATIVE mg/dL
Ketones, ur: NEGATIVE mg/dL
Nitrite: NEGATIVE
Protein, ur: NEGATIVE mg/dL
Specific Gravity, Urine: 1.02 (ref 1.005–1.030)
pH: 5 (ref 5.0–8.0)

## 2022-06-08 LAB — APTT: aPTT: 28 seconds (ref 24–36)

## 2022-06-08 LAB — BASIC METABOLIC PANEL
Anion gap: 7 (ref 5–15)
BUN: 13 mg/dL (ref 6–20)
CO2: 22 mmol/L (ref 22–32)
Calcium: 8.6 mg/dL — ABNORMAL LOW (ref 8.9–10.3)
Chloride: 108 mmol/L (ref 98–111)
Creatinine, Ser: 0.66 mg/dL (ref 0.44–1.00)
GFR, Estimated: >60 mL/min (ref >60)
Glucose, Bld: 98 mg/dL (ref 70–99)
Potassium: 4.1 mmol/L (ref 3.5–5.1)
Sodium: 137 mmol/L (ref 135–145)

## 2022-06-08 LAB — PROTIME-INR
INR: 1 (ref 0.8–1.2)
Prothrombin Time: 13 seconds (ref 11.4–15.2)

## 2022-06-08 NOTE — Progress Notes (Signed)
[facility] Attending  Case discussed with Dr. Liang  at the time of the visit.  We reviewed the resident's history and exam and pertinent patient test results.  I agree with the assessment, diagnosis, and plan of care documented in the resident's note.  

## 2022-06-08 NOTE — H&P (Signed)
Chief Complaint   Aneurysm  History of Present Illness  Kerry Chavez is a 37 y.o. female with a history of subarachnoid hemorrhage approximately 6 months ago.  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  She has done very well from a neurologic standpoint.  She comes in today for routine short-term angiographic follow-up.  Past Medical History   Past Medical History:  Diagnosis Date   Anxiety    Asthma    Depression     Past Surgical History   Past Surgical History:  Procedure Laterality Date   CESAREAN SECTION     CESAREAN SECTION WITH BILATERAL TUBAL LIGATION Bilateral 06/12/2014   Procedure: Repeat CESAREAN SECTION WITH BILATERAL TUBAL LIGATION;  Surgeon: Servando Salina, MD;  Location: [facility];  Service: Obstetrics;  Laterality: Bilateral;  EDD: 06/19/14 Wants clear C-Section drape for this case   IR ANGIO INTRA EXTRACRAN SEL INTERNAL CAROTID UNI L MOD SED  12/21/2021   IR ANGIO INTRA EXTRACRAN SEL INTERNAL CAROTID UNI R MOD SED  12/21/2021   IR ANGIO VERTEBRAL SEL VERTEBRAL BILAT MOD SED  12/21/2021   IR ANGIOGRAM FOLLOW UP STUDY  12/21/2021   IR ANGIOGRAM FOLLOW UP STUDY  12/21/2021   IR TRANSCATH/EMBOLIZ  12/21/2021   RADIOLOGY WITH ANESTHESIA N/A 12/21/2021   Procedure: IR WITH ANESTHESIA;  Surgeon: Consuella Lose, MD;  Location: [place];  Service: Radiology;  Laterality: N/A;   TONSILLECTOMY AND ADENOIDECTOMY  1998    Social History   Social History   Tobacco Use   Smoking status: Former    Packs/day: 0.25    Years: 10.00    Additional pack years: 0.00    Total pack years: 2.50    Types: Cigarettes    Quit date: 12/20/2021    Years since quitting: 0.4    Passive exposure: Past   Smokeless tobacco: Never  Substance Use Topics   Alcohol use: Yes    Comment: Socially   Drug use: Not Currently    Medications   Prior to Admission medications   Medication Sig Start Date End Date Taking? Authorizing  Provider  diphenhydrAMINE (BENADRYL) 25 MG tablet Take 25 mg by mouth every 6 (six) hours as needed for sleep (anxiety).   Yes [provider]  diphenhydramine-acetaminophen (TYLENOL PM) 25-500 MG TABS tablet Take 1 tablet by mouth at bedtime as needed (headache pain).   Yes [provider]  ferrous sulfate 324 (65 Fe) MG TBEC Take 1 tablet (325 mg total) by mouth every other day. 06/04/22  Yes Iona Beard, MD  hydrOXYzine (ATARAX) 25 MG tablet Take 1 tablet (25 mg total) by mouth every 8 (eight) hours as needed for anxiety. TAKE 1 TABLET(25 MG) BY MOUTH THREE TIMES DAILY AS NEEDED Strength: 25 mg 06/04/22  Yes Iona Beard, MD  cholecalciferol (VITAMIN D3) 25 MCG (1000 UNIT) tablet Take 1 tablet (1,000 Units total) by mouth daily. Patient not taking: Reported on 05/26/2022 05/02/22   Delene Ruffini, MD  famotidine (PEPCID) 20 MG tablet Take 1 tablet (20 mg total) by mouth 2 (two) times daily. 05/26/22   Rex Kras, PA  sertraline (ZOLOFT) 25 MG tablet Take 1 tablet (25 mg total) by mouth daily. Patient not taking: Reported on 05/26/2022 05/02/22 05/02/23  Delene Ruffini, MD  citalopram (CELEXA) 20 MG tablet Take 1 tablet (20 mg total) by mouth daily. 01/25/20 06/18/20  Benay Pike, MD    Allergies   Allergies  Allergen Reactions  Shellfish Allergy Swelling   Ivp Dye [Iodinated Contrast Media] Other (See Comments)    Unknown reaction   Penicillins Hives, Itching and Swelling    REACTION: rash   Sulfamethoxazole Hives and Swelling    REACTION: rash   Latex Itching and Rash    With condoms only    Review of Systems  ROS  Neurologic Exam  Awake, alert, oriented Memory and concentration grossly intact Speech fluent, appropriate CN grossly intact Motor exam: Upper Extremities Deltoid Bicep Tricep Grip  Right 5/5 5/5 5/5 5/5  Left 5/5 5/5 5/5 5/5   Lower Extremities IP Quad PF DF EHL  Right 5/5 5/5 5/5 5/5 5/5  Left 5/5 5/5 5/5 5/5 5/5   Sensation grossly intact  to LT  Impression  - 37 y.o. 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  Plan  -We will plan on proceeding with routine short-term angiogram of the right carotid  I have reviewed the indications for the procedure as well as the details of the procedure and the expected postoperative course and recovery at length with the patient in the office. We have also reviewed in detail the risks, benefits, and alternatives to the procedure. All questions were answered and Avanti Rittenberry provided informed consent to proceed.  Consuella Lose, MD [facility]

## 2022-06-08 NOTE — Progress Notes (Signed)
Pt's procedure delayed due to MD having emergency case. Pt very polite and patient about delay. After a while longer of waiting, pt decided to reschedule. IV removed. Pt dressed and walked out. Will call office to reschedule.

## 2022-06-12 ENCOUNTER — Other Ambulatory Visit (HOSPITAL_COMMUNITY): Payer: Self-pay | Admitting: Neurosurgery

## 2022-06-12 DIAGNOSIS — I609 Nontraumatic subarachnoid hemorrhage, unspecified: Secondary | ICD-10-CM

## 2022-06-13 ENCOUNTER — Ambulatory Visit (INDEPENDENT_AMBULATORY_CARE_PROVIDER_SITE_OTHER): Payer: BC Managed Care – PPO | Admitting: Licensed Clinical Social Worker

## 2022-06-13 DIAGNOSIS — F411 Generalized anxiety disorder: Secondary | ICD-10-CM

## 2022-06-19 ENCOUNTER — Ambulatory Visit (INDEPENDENT_AMBULATORY_CARE_PROVIDER_SITE_OTHER): Payer: BC Managed Care – PPO

## 2022-06-19 VITALS — BP 115/82 | HR 105 | Temp 98.0°F | Ht 61.0 in | Wt 217.6 lb

## 2022-06-19 DIAGNOSIS — F411 Generalized anxiety disorder: Secondary | ICD-10-CM | POA: Diagnosis not present

## 2022-06-19 MED ORDER — ESCITALOPRAM OXALATE 5 MG PO TABS: 5.0000 mg | ORAL_TABLET | Freq: Every day | ORAL | 2 refills | Status: DC

## 2022-06-19 MED ORDER — BUSPIRONE HCL 5 MG PO TABS: 5.0000 mg | ORAL_TABLET | Freq: Three times a day (TID) | ORAL | 2 refills | Status: DC

## 2022-06-19 NOTE — Assessment & Plan Note (Signed)
>>  ASSESSMENT AND PLAN FOR GENERALIZED ANXIETY DISORDER WRITTEN ON 06/19/2022  5:09 PM BY WHITE, JONATHAN, MD  Patient presents today with recent worsening of her anxiety.  She had an aneurysm last year and feels like she did okay for a few months but eventually developed generalized anxiety with now worsening panic attacks.  She feels like she has chest pain and localized spots all over her chest on the left and right that can be sharp in nature.  I do not feel this is cardiac in nature as it is reproducible on exam, does not worsen with exertion, does not radiate to jaw, arm, or neck, and is sharp in nature.  Her chest pain always coincides with her panic symptoms.  She will also develop lightheadedness and shaking.  She states that she constantly checks her pulse on her phone and is concerned when her heart rate goes over 100.  She had been taking sertraline 25 mg but discontinued as she felt like it made her more anxious initially.  She has counseling once a month.  She is concerned because she does not want to leave her children behind.  Denies SI, HI, AVH. -Trial Lexapro -Trial BuSpar scheduled -Encouraged to reach out to counselor to increase frequency of sessions -Follow-up in 1 month

## 2022-06-19 NOTE — Progress Notes (Signed)
   CC: chest pain, breast pain  HPI:  Kerry Chavez is a 37 y.o. with past medical history as below who presents for chest pain and breast pain. See detailed assessment and plan for HPI.  Past Medical History:  Diagnosis Date   Anxiety    Asthma    Depression    Review of Systems:  See detailed assessment and plan for pertinent ROS.  Physical Exam:  Vitals:   06/19/22 1519  BP: 115/82  Pulse: (!) 105  Temp: 98 F (36.7 C)  TempSrc: Oral  SpO2: 100%  Weight: 217 lb 9.6 oz (98.7 kg)  Height: 5\' 1"  (1.549 m)   Physical Exam Constitutional:      General: She is not in acute distress. Eyes:     Extraocular Movements: Extraocular movements intact.  Cardiovascular:     Rate and Rhythm: Regular rhythm. Tachycardia present.     Heart sounds: No murmur heard. Pulmonary:     Effort: Pulmonary effort is normal.     Breath sounds: No wheezing, rhonchi or rales.  Musculoskeletal:     Right lower leg: No edema.     Left lower leg: No edema.     Comments: Tenderness to palpation of sternum.  Skin:    General: Skin is warm and dry.  Neurological:     General: No focal deficit present.     Mental Status: She is alert and oriented to person, place, and time.  Psychiatric:        Attention and Perception: She does not perceive auditory hallucinations.        Mood and Affect: Mood is anxious.        Behavior: Behavior normal.        Thought Content: Thought content does not include homicidal or suicidal ideation.      Assessment & Plan:   See Encounters Tab for problem based charting.  Generalized anxiety disorder Patient presents today with recent worsening of her anxiety.  She had an aneurysm last year and feels like she did okay for a few months but eventually developed generalized anxiety with now worsening panic attacks.  She feels like she has chest pain and localized spots all over her chest on the left and right that can be sharp in nature.  I do not feel this is  cardiac in nature as it is reproducible on exam, does not worsen with exertion, does not radiate to jaw, arm, or neck, and is sharp in nature.  Her chest pain always coincides with her panic symptoms.  She will also develop lightheadedness and shaking.  She states that she constantly checks her pulse on her phone and is concerned when her heart rate goes over 100.  She had been taking sertraline 25 mg but discontinued as she felt like it made her more anxious initially.  She has counseling once a month.  She is concerned because she does not want to leave her children behind.  Denies SI, HI, AVH. -Trial Lexapro -Trial BuSpar scheduled -Encouraged to reach out to counselor to increase frequency of sessions -Follow-up in 1 month    Patient discussed with Dr.  Saverio Danker

## 2022-06-19 NOTE — Patient Instructions (Signed)
Ms.Analiese Rhoten, it was a pleasure seeing you today!  Today we discussed: Anxiety - Start lexapro once a day and buspirone 3 times a day. Return to clinic in 1 month to discuss symptoms and whether there is a need to increase dose.  I have ordered the following labs today:  Lab Orders  No laboratory test(s) ordered today     Tests ordered today:  none  Referrals ordered today:   Referral Orders  No referral(s) requested today     I have ordered the following medication/changed the following medications:   Stop the following medications: Medications Discontinued During This Encounter  Medication Reason   sertraline (ZOLOFT) 25 MG tablet Change in therapy   hydrOXYzine (ATARAX) 25 MG tablet Change in therapy     Start the following medications: Meds ordered this encounter  Medications   escitalopram (LEXAPRO) 5 MG tablet    Sig: Take 1 tablet (5 mg total) by mouth daily.    Dispense:  30 tablet    Refill:  2   busPIRone (BUSPAR) 5 MG tablet    Sig: Take 1 tablet (5 mg total) by mouth 3 (three) times daily.    Dispense:  90 tablet    Refill:  2     Follow-up:  1 month    Please make sure to arrive 15 minutes prior to your next appointment. If you arrive late, you may be asked to reschedule.   We look forward to seeing you next time. Please call our clinic at [phone] if you have any questions or concerns. The best time to call is Monday-Friday from 9am-4pm, but there is someone available 24/7. If after hours or the weekend, call the main hospital number and ask for the Internal Medicine Resident On-Call. If you need medication refills, please notify your pharmacy one week in advance and they will send [place] a request.  Thank you for letting us take part in your care. Wishing you the best!  Thank you, Linward Natal, MD

## 2022-06-19 NOTE — Assessment & Plan Note (Addendum)
Patient presents today with recent worsening of her anxiety.  She had an aneurysm last year and feels like she did okay for a few months but eventually developed generalized anxiety with now worsening panic attacks.  She feels like she has chest pain and localized spots all over her chest on the left and right that can be sharp in nature.  I do not feel this is cardiac in nature as it is reproducible on exam, does not worsen with exertion, does not radiate to jaw, arm, or neck, and is sharp in nature.  Her chest pain always coincides with her panic symptoms.  She will also develop lightheadedness and shaking.  She states that she constantly checks her pulse on her phone and is concerned when her heart rate goes over 100.  She had been taking sertraline 25 mg but discontinued as she felt like it made her more anxious initially.  She has counseling once a month.  She is concerned because she does not want to leave her children behind.  Denies SI, HI, AVH. -Trial Lexapro -Trial BuSpar scheduled -Encouraged to reach out to counselor to increase frequency of sessions -Follow-up in 1 month

## 2022-06-21 NOTE — Progress Notes (Signed)
Internal Medicine Clinic Attending  Case discussed with Dr. White  At the time of the visit.  We reviewed the resident's history and exam and pertinent patient test results.  I agree with the assessment, diagnosis, and plan of care documented in the resident's note.  

## 2022-06-26 ENCOUNTER — Other Ambulatory Visit (HOSPITAL_COMMUNITY): Payer: Self-pay | Admitting: Neurosurgery

## 2022-06-26 ENCOUNTER — Other Ambulatory Visit: Payer: Self-pay

## 2022-06-26 ENCOUNTER — Institutional Professional Consult (permissible substitution): Payer: BC Managed Care – PPO | Admitting: Neurology

## 2022-06-26 ENCOUNTER — Ambulatory Visit (HOSPITAL_COMMUNITY)
Admission: RE | Admit: 2022-06-26 | Discharge: 2022-06-26 | Disposition: A | Discharge status: Home / Self Care | Payer: BC Managed Care – PPO | Source: Ambulatory Visit | Attending: Neurosurgery | Admitting: Neurosurgery

## 2022-06-26 DIAGNOSIS — I609 Nontraumatic subarachnoid hemorrhage, unspecified: Secondary | ICD-10-CM

## 2022-06-26 DIAGNOSIS — Z87891 Personal history of nicotine dependence: Secondary | ICD-10-CM | POA: Insufficient documentation

## 2022-06-26 DIAGNOSIS — I671 Cerebral aneurysm, nonruptured: Secondary | ICD-10-CM | POA: Diagnosis not present

## 2022-06-26 DIAGNOSIS — F419 Anxiety disorder, unspecified: Secondary | ICD-10-CM | POA: Insufficient documentation

## 2022-06-26 HISTORY — PX: IR US GUIDE VASC ACCESS RIGHT: IMG2390

## 2022-06-26 HISTORY — PX: IR ANGIO INTRA EXTRACRAN SEL INTERNAL CAROTID UNI R MOD SED: IMG5362

## 2022-06-26 LAB — BASIC METABOLIC PANEL
Anion gap: 11 (ref 5–15)
BUN: 5 mg/dL — ABNORMAL LOW (ref 6–20)
CO2: 21 mmol/L — ABNORMAL LOW (ref 22–32)
Calcium: 8.7 mg/dL — ABNORMAL LOW (ref 8.9–10.3)
Chloride: 103 mmol/L (ref 98–111)
Creatinine, Ser: 0.74 mg/dL (ref 0.44–1.00)
GFR, Estimated: >60 mL/min (ref >60)
Glucose, Bld: 102 mg/dL — ABNORMAL HIGH (ref 70–99)
Potassium: 3.3 mmol/L — ABNORMAL LOW (ref 3.5–5.1)
Sodium: 135 mmol/L (ref 135–145)

## 2022-06-26 MED ORDER — MIDAZOLAM HCL 2 MG/2ML IJ SOLN
INTRAMUSCULAR | Status: AC | PRN
  Administered 2022-06-26: 1 mg via INTRAVENOUS

## 2022-06-26 MED ORDER — HYDROCODONE-ACETAMINOPHEN 5-325 MG PO TABS: 1.0000 | ORAL_TABLET | ORAL | Status: DC | PRN

## 2022-06-26 MED ORDER — MIDAZOLAM HCL 2 MG/2ML IJ SOLN
INTRAMUSCULAR | Status: AC
  Filled 2022-06-26: qty 2

## 2022-06-26 MED ORDER — NITROGLYCERIN 1 MG/10 ML FOR IR/CATH LAB
INTRA_ARTERIAL | Status: AC
  Filled 2022-06-26: qty 10

## 2022-06-26 MED ORDER — VERAPAMIL HCL 2.5 MG/ML IV SOLN: INTRA_ARTERIAL | Status: AC | PRN

## 2022-06-26 MED ORDER — SODIUM CHLORIDE 0.9 % IV SOLN: INTRAVENOUS | Status: DC

## 2022-06-26 MED ORDER — VERAPAMIL HCL 2.5 MG/ML IV SOLN
INTRAVENOUS | Status: AC
  Filled 2022-06-26: qty 2

## 2022-06-26 MED ORDER — HEPARIN SODIUM (PORCINE) 1000 UNIT/ML IJ SOLN
INTRAMUSCULAR | Status: AC
  Filled 2022-06-26: qty 10

## 2022-06-26 MED ORDER — LIDOCAINE HCL 1 % IJ SOLN
INTRAMUSCULAR | Status: AC
  Filled 2022-06-26: qty 20

## 2022-06-26 MED ORDER — FENTANYL CITRATE (PF) 100 MCG/2ML IJ SOLN
INTRAMUSCULAR | Status: AC
  Filled 2022-06-26: qty 2

## 2022-06-26 MED ORDER — IOHEXOL 300 MG/ML  SOLN
100.0000 mL | Freq: Once | INTRAMUSCULAR | Status: AC | PRN
  Administered 2022-06-26: 30 mL via INTRA_ARTERIAL

## 2022-06-26 MED ORDER — HEPARIN SODIUM (PORCINE) 1000 UNIT/ML IJ SOLN
INTRAMUSCULAR | Status: AC | PRN
  Administered 2022-06-26: 3000 [IU] via INTRAVENOUS

## 2022-06-26 MED ORDER — FENTANYL CITRATE (PF) 100 MCG/2ML IJ SOLN
INTRAMUSCULAR | Status: AC | PRN
  Administered 2022-06-26: 25 ug via INTRAVENOUS

## 2022-06-26 NOTE — Progress Notes (Addendum)
Pt reports peripheral vision changes, mini neuro-check reveals no deficits, rapid response called Vision changes resolved after approx. 10 min per pt

## 2022-06-26 NOTE — Progress Notes (Deleted)
MEDICAL GENETICS NEW PATIENT EVALUATION  Patient name: Kerry Chavez DOB: Oct 03, 1985 Age: 37 y.o. MRN: [id number]  Referring Provider/Specialty: Reymundo Poll, MD / Internal Medicine Center Date of Evaluation: 06/26/2022*** Chief Complaint/Reason for Referral: Aneurysmal subarachnoid hemorrhage  HPI: Kerry Chavez is a 37 y.o. female who presents today for an initial genetics evaluation for ***. She is accompanied by her *** at today's visit.  ***  In September 2023 experienced severe headache and syncopal episode. 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  She has anxiety that has increased since aneurysm.  Iron deficiency  Heavy menstrual bleeding for past 8 years since her last pregnancy- periods are regular but heavy.  GI bleeding recently- thought to be related to constipation and hemorrhoids from iron supplement but referred to GI because happened multiple times.   Father died at [facility] from a ruptured intracranial aneurysm, no other family history.  Prior genetic testing has not*** been performed.  Past Medical History: Past Medical History:  Diagnosis Date   Anxiety    Asthma    Depression    Patient Active Problem List   Diagnosis Date Noted   Abnormal uterine bleeding (AUB) 06/04/2022   Bleeding external hemorrhoids 06/04/2022   Healthcare maintenance 06/04/2022   Fatigue 05/06/2022   Headache 05/06/2022   Anxiety 05/06/2022   Iron deficiency anemia due to chronic blood loss 02/27/2022   Aneurysmal subarachnoid hemorrhage 12/21/2021   Generalized anxiety disorder 12/13/2020   Vitamin D deficiency 06/23/2020   OBESITY, UNSPECIFIED 11/10/2007    Past Surgical History:  Past Surgical History:  Procedure Laterality Date   CESAREAN SECTION     CESAREAN SECTION WITH BILATERAL TUBAL LIGATION Bilateral 06/12/2014   Procedure: Repeat CESAREAN SECTION WITH BILATERAL TUBAL LIGATION;  Surgeon: Maxie Better, MD;   Location: [facility];  Service: Obstetrics;  Laterality: Bilateral;  EDD: 06/19/14 Wants clear C-Section drape for this case   IR ANGIO INTRA EXTRACRAN SEL INTERNAL CAROTID UNI L MOD SED  12/21/2021   IR ANGIO INTRA EXTRACRAN SEL INTERNAL CAROTID UNI R MOD SED  12/21/2021   IR ANGIO INTRA EXTRACRAN SEL INTERNAL CAROTID UNI R MOD SED  06/26/2022   IR ANGIO VERTEBRAL SEL VERTEBRAL BILAT MOD SED  12/21/2021   IR ANGIOGRAM FOLLOW UP STUDY  12/21/2021   IR ANGIOGRAM FOLLOW UP STUDY  12/21/2021   IR TRANSCATH/EMBOLIZ  12/21/2021   IR US GUIDE VASC ACCESS RIGHT  06/26/2022   RADIOLOGY WITH ANESTHESIA N/A 12/21/2021   Procedure: IR WITH ANESTHESIA;  Surgeon: Lisbeth Renshaw, MD;  [facility]: [facility];  Service: Radiology;  Laterality: N/A;   TONSILLECTOMY AND ADENOIDECTOMY  1998    Developmental History: Milestones -- ***  Therapies -- ***  Toilet training -- ***  School -- ***  Social History: Social History   Social History Narrative   Not on file    Medications: Current Outpatient Medications on File Prior to Visit  Medication Sig Dispense Refill   busPIRone (BUSPAR) 5 MG tablet Take 1 tablet (5 mg total) by mouth 3 (three) times daily. 90 tablet 2   cholecalciferol (VITAMIN D3) 25 MCG (1000 UNIT) tablet Take 1 tablet (1,000 Units total) by mouth daily. (Patient not taking: Reported on 05/26/2022) 30 tablet 2   diphenhydrAMINE (BENADRYL) 25 MG tablet Take 25 mg by mouth every 6 (six) hours as needed for sleep (anxiety).     diphenhydramine-acetaminophen (TYLENOL PM) 25-500 MG TABS tablet Take 1 tablet by mouth at bedtime as needed (headache pain).  escitalopram (LEXAPRO) 5 MG tablet Take 1 tablet (5 mg total) by mouth daily. 30 tablet 2   famotidine (PEPCID) 20 MG tablet Take 1 tablet (20 mg total) by mouth 2 (two) times daily. 30 tablet 0   ferrous sulfate 324 (65 Fe) MG TBEC Take 1 tablet (325 mg total) by mouth every other day. 90 tablet 1   [DISCONTINUED] citalopram (CELEXA) 20 MG tablet Take  1 tablet (20 mg total) by mouth daily. 30 tablet 2   Current Facility-Administered Medications on File Prior to Visit  Medication Dose Route Frequency Provider Last Rate Last Admin   0.9 %  sodium chloride infusion   Intravenous Continuous Lisbeth Renshaw, MD       HYDROcodone-acetaminophen (NORCO/VICODIN) 5-325 MG per tablet 1-2 tablet  1-2 tablet Oral Q4H PRN Lisbeth Renshaw, MD       lidocaine (XYLOCAINE) 1 % (with pres) injection            nitroGLYCERIN 100 mcg/mL intra-arterial injection            verapamil (ISOPTIN) 2.5 MG/ML injection             Allergies:  Allergies  Allergen Reactions   Shellfish Allergy Swelling   Ivp Dye [Iodinated Contrast Media] Other (See Comments)    Unknown reaction   Penicillins Hives, Itching and Swelling    REACTION: rash   Sulfamethoxazole Hives and Swelling    REACTION: rash   Latex Itching and Rash    With condoms only    Immunizations: ***up to date  Review of Systems: General: *** Eyes/vision: *** Ears/hearing: *** Dental: *** Respiratory: *** Cardiovascular: *** Gastrointestinal: *** Genitourinary: *** Endocrine: *** Hematologic: *** Immunologic: *** Neurological: *** Psychiatric: *** Musculoskeletal: *** Skin, Hair, Nails: ***  Family History: See pedigree below obtained during today's visit: ***  Notable family history: ***  Mother's ethnicity: *** Father's ethnicity: *** Consanguinity: ***Denies  Physical Examination: Weight: *** (***%) Height: *** (***%); mid-parental ***% Head circumference: *** (***%)  There were no vitals taken for this visit.  General: ***Alert, interactive Head: ***Normocephalic Eyes: ***[place], ***Normal lids, lashes, brows, ICD *** cm, OCD *** cm, Calculated***/Measured*** IPD *** cm (***%) Nose: *** Lips/Mouth/Teeth: *** Ears: ***[place] and normally formed, no pits, tags or creases Neck: ***Normal appearance Chest: ***No pectus deformities, nipples appear normally  spaced and formed, IND *** cm, CC *** cm, IND/CC ratio *** (***%) Heart: ***Warm and well perfused Lungs: ***No increased work of breathing Abdomen: ***Soft, non-distended, no masses, no hepatosplenomegaly, no hernias Genitalia: *** Skin: ***No axillary or inguinal freckling Hair: ***Normal anterior and posterior hairline, ***normal texture Neurologic: ***Normal gross motor by observation, no abnormal movements Psych: *** Back/spine: ***No scoliosis, ***no sacral dimple Extremities: ***Symmetric and proportionate Hands/Feet: ***Normal hands, fingers and nails, ***2 palmar creases bilaterally, ***Normal feet, toes and nails, ***No clinodactyly, syndactyly or polydactyly  ***Photo of patient in Epic (parental verbal consent obtained)  Prior Genetic testing: ***  Pertinent Labs: ***  Pertinent Imaging/Studies: ***  Assessment: Esma Kilts is a 37 y.o. female with ***. Growth parameters show ***. Development ***. Physical examination notable for ***. Family history is ***.  Recommendations: ***  A ***blood/saliva/buccal sample was obtained during today's visit for the above genetic testing and sent to ***. Results are anticipated in ***4-6 weeks. We will contact the family to discuss results once available and arrange follow-up as needed.    Kerry Bills, MS, [facility] Certified Genetic Counselor  Loletha Grayer, D.O. Attending Physician, Medical [facility]  [facility] Date: 06/26/2022 Time: ***   Total time spent: *** Time spent includes face to face and non-face to face care for the patient on the date of this encounter (history and physical, genetic counseling, coordination of care, data gathering and/or documentation as outlined)

## 2022-06-26 NOTE — Progress Notes (Signed)
TR BAND REMOVAL  LOCATION:    right radial  DEFLATED PER PROTOCOL:    Yes.    TIME BAND OFF / DRESSING APPLIED:    1455 gauze dressing applied    SITE UPON ARRIVAL:    Level 0  SITE AFTER BAND REMOVAL:    Level 0  CIRCULATION SENSATION AND MOVEMENT:    Within Normal Limits   Yes.    COMMENTS:   no new issues noted

## 2022-06-26 NOTE — H&P (Signed)
Chief Complaint   Aneurysm  History of Present Illness  Kerry Chavez is a 37 y.o. 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 She has made an excellent neurologic recovery. She does have significant issues with anxiety. She does continue to have some typical post subarachnoid hemorrhage complaints including some fatigue and trouble concentrating. Unfortunately the symptoms caused her a fair bit of anxiety. She presents today for routine short-term angiographic follow-up.  Past Medical History   Past Medical History:  Diagnosis Date   Anxiety    Asthma    Depression     Past Surgical History   Past Surgical History:  Procedure Laterality Date   CESAREAN SECTION     CESAREAN SECTION WITH BILATERAL TUBAL LIGATION Bilateral 06/12/2014   Procedure: Repeat CESAREAN SECTION WITH BILATERAL TUBAL LIGATION;  Surgeon: Servando Salina, MD;  Location: [facility];  Service: Obstetrics;  Laterality: Bilateral;  EDD: 06/19/14 Wants clear C-Section drape for this case   IR ANGIO INTRA EXTRACRAN SEL INTERNAL CAROTID UNI L MOD SED  12/21/2021   IR ANGIO INTRA EXTRACRAN SEL INTERNAL CAROTID UNI R MOD SED  12/21/2021   IR ANGIO VERTEBRAL SEL VERTEBRAL BILAT MOD SED  12/21/2021   IR ANGIOGRAM FOLLOW UP STUDY  12/21/2021   IR ANGIOGRAM FOLLOW UP STUDY  12/21/2021   IR TRANSCATH/EMBOLIZ  12/21/2021   RADIOLOGY WITH ANESTHESIA N/A 12/21/2021   Procedure: IR WITH ANESTHESIA;  Surgeon: Consuella Lose, MD;  Location: [facility];  Service: Radiology;  Laterality: N/A;   TONSILLECTOMY AND ADENOIDECTOMY  1998    Social History   Social History   Tobacco Use   Smoking status: Former    Packs/day: 0.25    Years: 10.00    Additional pack years: 0.00    Total pack years: 2.50    Types: Cigarettes    Quit date: 12/20/2021    Years since quitting: 0.5    Passive exposure: Past   Smokeless tobacco: Never  Substance Use Topics   Alcohol use: Yes    Comment: Socially    Drug use: Not Currently    Medications   Prior to Admission medications   Medication Sig Start Date End Date Taking? Authorizing Provider  busPIRone (BUSPAR) 5 MG tablet Take 1 tablet (5 mg total) by mouth 3 (three) times daily. 06/19/22  Yes Linward Natal, MD  diphenhydrAMINE (BENADRYL) 25 MG tablet Take 25 mg by mouth every 6 (six) hours as needed for sleep (anxiety).   Yes [provider]  diphenhydramine-acetaminophen (TYLENOL PM) 25-500 MG TABS tablet Take 1 tablet by mouth at bedtime as needed (headache pain).   Yes [provider]  escitalopram (LEXAPRO) 5 MG tablet Take 1 tablet (5 mg total) by mouth daily. 06/19/22  Yes Linward Natal, MD  ferrous sulfate 324 (65 Fe) MG TBEC Take 1 tablet (325 mg total) by mouth every other day. 06/04/22  Yes Iona Beard, MD  cholecalciferol (VITAMIN D3) 25 MCG (1000 UNIT) tablet Take 1 tablet (1,000 Units total) by mouth daily. Patient not taking: Reported on 05/26/2022 05/02/22   Delene Ruffini, MD  famotidine (PEPCID) 20 MG tablet Take 1 tablet (20 mg total) by mouth 2 (two) times daily. 05/26/22   Rex Kras, PA  citalopram (CELEXA) 20 MG tablet Take 1 tablet (20 mg total) by mouth daily. 01/25/20 06/18/20  Benay Pike, MD    Allergies   Allergies  Allergen Reactions   Shellfish Allergy Swelling   Ivp  Dye [Iodinated Contrast Media] Other (See Comments)    Unknown reaction   Penicillins Hives, Itching and Swelling    REACTION: rash   Sulfamethoxazole Hives and Swelling    REACTION: rash   Latex Itching and Rash    With condoms only    Review of Systems  ROS  Neurologic Exam  Awake, alert, oriented Memory and concentration grossly intact Speech fluent, appropriate CN grossly intact Motor exam: Upper Extremities Deltoid Bicep Tricep Grip  Right 5/5 5/5 5/5 5/5  Left 5/5 5/5 5/5 5/5   Lower Extremities IP Quad PF DF EHL  Right 5/5 5/5 5/5 5/5 5/5  Left 5/5 5/5 5/5 5/5 5/5   Sensation grossly intact to  LT  Impression  - 37 y.o. female 55mo s/p SAH and coil embo of right Pcom aneurysm, doing well neurologically  Plan  - Proceed with follow-up angiogram, likely right trans-radial approach - Pt does not report a known iodinated contrast allergy, only reports a shellfish allergy.  I have reviewed the indications for the procedure as well as the details of the procedure and the expected postoperative course and recovery at length with the patient in the office. We have also reviewed in detail the risks, benefits, and alternatives to the procedure. All questions were answered and Alauna Urdiales provided informed consent to proceed.  Consuella Lose, MD [facility]

## 2022-06-30 ENCOUNTER — Emergency Department (HOSPITAL_COMMUNITY): Payer: BC Managed Care – PPO

## 2022-06-30 ENCOUNTER — Other Ambulatory Visit: Payer: Self-pay

## 2022-06-30 ENCOUNTER — Emergency Department (HOSPITAL_COMMUNITY)
Admission: EM | Admit: 2022-06-30 | Discharge: 2022-06-30 | Disposition: A | Discharge status: Home / Self Care | Payer: BC Managed Care – PPO | Attending: Emergency Medicine | Admitting: Emergency Medicine

## 2022-06-30 ENCOUNTER — Encounter (HOSPITAL_COMMUNITY): Payer: Self-pay

## 2022-06-30 DIAGNOSIS — J9811 Atelectasis: Secondary | ICD-10-CM | POA: Diagnosis not present

## 2022-06-30 DIAGNOSIS — R079 Chest pain, unspecified: Secondary | ICD-10-CM | POA: Insufficient documentation

## 2022-06-30 DIAGNOSIS — R000 Tachycardia, unspecified: Secondary | ICD-10-CM | POA: Insufficient documentation

## 2022-06-30 DIAGNOSIS — K921 Melena: Secondary | ICD-10-CM | POA: Insufficient documentation

## 2022-06-30 DIAGNOSIS — Z91040 Latex allergy status: Secondary | ICD-10-CM | POA: Diagnosis not present

## 2022-06-30 DIAGNOSIS — R42 Dizziness and giddiness: Secondary | ICD-10-CM | POA: Insufficient documentation

## 2022-06-30 DIAGNOSIS — R519 Headache, unspecified: Secondary | ICD-10-CM | POA: Diagnosis not present

## 2022-06-30 DIAGNOSIS — R0789 Other chest pain: Secondary | ICD-10-CM | POA: Diagnosis not present

## 2022-06-30 LAB — PROTIME-INR
INR: 1 (ref 0.8–1.2)
Prothrombin Time: 13.2 seconds (ref 11.4–15.2)

## 2022-06-30 LAB — COMPREHENSIVE METABOLIC PANEL
ALT: 18 U/L (ref 0–44)
AST: 19 U/L (ref 15–41)
Albumin: 3.6 g/dL (ref 3.5–5.0)
Alkaline Phosphatase: 57 U/L (ref 38–126)
Anion gap: 9 (ref 5–15)
BUN: <5 mg/dL — ABNORMAL LOW (ref 6–20)
CO2: 22 mmol/L (ref 22–32)
Calcium: 8.9 mg/dL (ref 8.9–10.3)
Chloride: 106 mmol/L (ref 98–111)
Creatinine, Ser: 0.65 mg/dL (ref 0.44–1.00)
GFR, Estimated: >60 mL/min (ref >60)
Glucose, Bld: 102 mg/dL — ABNORMAL HIGH (ref 70–99)
Potassium: 3.2 mmol/L — ABNORMAL LOW (ref 3.5–5.1)
Sodium: 137 mmol/L (ref 135–145)
Total Bilirubin: 0.4 mg/dL (ref 0.3–1.2)
Total Protein: 7.1 g/dL (ref 6.5–8.1)

## 2022-06-30 LAB — CBC WITH DIFFERENTIAL/PLATELET
Abs Immature Granulocytes: 0.04 10*3/uL (ref 0.00–0.07)
Basophils Absolute: 0 10*3/uL (ref 0.0–0.1)
Basophils Relative: 0 %
Eosinophils Absolute: 0 10*3/uL (ref 0.0–0.5)
Eosinophils Relative: 0 %
HCT: 33.2 % — ABNORMAL LOW (ref 36.0–46.0)
Hemoglobin: 10.6 g/dL — ABNORMAL LOW (ref 12.0–15.0)
Immature Granulocytes: 0 %
Lymphocytes Relative: 13 %
Lymphs Abs: 1.4 10*3/uL (ref 0.7–4.0)
MCH: 27.7 pg (ref 26.0–34.0)
MCHC: 31.9 g/dL (ref 30.0–36.0)
MCV: 86.9 fL (ref 80.0–100.0)
Monocytes Absolute: 0.6 10*3/uL (ref 0.1–1.0)
Monocytes Relative: 6 %
Neutro Abs: 8.7 10*3/uL — ABNORMAL HIGH (ref 1.7–7.7)
Neutrophils Relative %: 81 %
Platelets: 363 10*3/uL (ref 150–400)
RBC: 3.82 MIL/uL — ABNORMAL LOW (ref 3.87–5.11)
RDW: 18.8 % — ABNORMAL HIGH (ref 11.5–15.5)
WBC: 10.8 10*3/uL — ABNORMAL HIGH (ref 4.0–10.5)
nRBC: 0 % (ref 0.0–0.2)

## 2022-06-30 LAB — I-STAT BETA HCG BLOOD, ED (MC, WL, AP ONLY): I-stat hCG, quantitative: 6.8 m[IU]/mL — ABNORMAL HIGH (ref <5)

## 2022-06-30 LAB — TROPONIN I (HIGH SENSITIVITY)
Troponin I (High Sensitivity): 4 ng/L (ref <18)
Troponin I (High Sensitivity): <2 ng/L (ref <18)

## 2022-06-30 LAB — TYPE AND SCREEN
ABO/RH(D): O POS
Antibody Screen: NEGATIVE

## 2022-06-30 LAB — LIPASE, BLOOD: Lipase: 29 U/L (ref 11–51)

## 2022-06-30 MED ORDER — LACTATED RINGERS IV BOLUS
1000.0000 mL | Freq: Once | INTRAVENOUS | Status: AC
  Administered 2022-06-30: 1000 mL via INTRAVENOUS

## 2022-06-30 NOTE — Discharge Instructions (Addendum)
Your labs here are overall reassuring, your hemoglobin was stable at 10.6 today.  Your troponin, the lab we talked about that checked your heart were both normal.  Your chest x-ray was also unremarkable.  Most importantly, you should keep your appointment with the GI doctors at the end of the month.  You should also follow-up with your primary care physician in 2 days to have your CBC rechecked just to check your hemoglobin level.  Please return if you have worsening dizziness, passing out, chest pain, shortness of breath, severe nausea or vomiting, abdominal pain, worsening of the blood in your stools or other concerns.

## 2022-06-30 NOTE — ED Provider Notes (Signed)
[place] EMERGENCY DEPARTMENT AT [facility] Provider Note   CSN: [id number] Arrival date & time: 06/30/22  1805     History {Add pertinent medical, surgical, social history, OB history to HPI:1} Chief Complaint  Patient presents with  . Blood In Stools    Kerry Chavez is a 37 y.o. female pmh SAH aneurysm, GAD, pw blood in stool when she has BM. Endorses dizziness, L chest pain. Had similar sx in March and has scheduled appointment w/ GI on the 23rd as a result of this visit.  Patient is also having epigastric pain, midsternal chest pain, both of these intermittent and unable to be associated with any particular triggers.  Patient states that bowel movements are [place], but loose and have bright red blood in them.  She is having approximately 2 episodes every day.  Patient has been having dizziness the past week and headaches intermittently, no vision changes or nausea.  Denies vaginal bleeding or hematuria, dysuria, fever, shortness of breath. Patient had recent cerebral angiogram 4 days ago that was unremarkable for acute changes.    HPI     Home Medications Prior to Admission medications   Medication Sig Start Date End Date Taking? Authorizing Provider  busPIRone (BUSPAR) 5 MG tablet Take 1 tablet (5 mg total) by mouth 3 (three) times daily. 06/19/22   Adron Bene, MD  cholecalciferol (VITAMIN D3) 25 MCG (1000 UNIT) tablet Take 1 tablet (1,000 Units total) by mouth daily. Patient not taking: Reported on 05/26/2022 05/02/22   Adron Bene, MD  diphenhydrAMINE (BENADRYL) 25 MG tablet Take 25 mg by mouth every 6 (six) hours as needed for sleep (anxiety).    [provider]  diphenhydramine-acetaminophen (TYLENOL PM) 25-500 MG TABS tablet Take 1 tablet by mouth at bedtime as needed (headache pain).    [provider]  escitalopram (LEXAPRO) 5 MG tablet Take 1 tablet (5 mg total) by mouth daily. 06/19/22   Adron Bene, MD  famotidine (PEPCID) 20 MG  tablet Take 1 tablet (20 mg total) by mouth 2 (two) times daily. 05/26/22   Jeanelle Malling, PA  ferrous sulfate 324 (65 Fe) MG TBEC Take 1 tablet (325 mg total) by mouth every other day. 06/04/22   Quincy Simmonds, MD  citalopram (CELEXA) 20 MG tablet Take 1 tablet (20 mg total) by mouth daily. 01/25/20 06/18/20  Sandre Kitty, MD      Allergies    Shellfish allergy, Ivp dye [iodinated contrast media], Penicillins, Sulfamethoxazole, and Latex    Review of Systems   Review of Systems  Physical Exam Updated Vital Signs BP 99/83   Pulse (!) 107   Temp 98.2 F (36.8 C) (Oral)   Resp 14   Ht 5\' 1"  (1.549 m)   Wt 93 kg   LMP 05/15/2022   SpO2 100%   BMI 38.74 kg/m  Physical Exam Vitals and nursing note reviewed.  Constitutional:      General: She is not in acute distress.    Appearance: She is well-developed.  HENT:     Head: Normocephalic and atraumatic.     Mouth/Throat:     Mouth: Mucous membranes are moist.     Pharynx: Oropharynx is clear.  Eyes:     Extraocular Movements: Extraocular movements intact.     Conjunctiva/sclera: Conjunctivae normal.     Pupils: Pupils are equal, round, and reactive to light.  Cardiovascular:     Rate and Rhythm: Regular rhythm. Tachycardia present.     Pulses: Normal  pulses.     Heart sounds: Normal heart sounds. No murmur heard. Pulmonary:     Effort: Pulmonary effort is normal. No respiratory distress.     Breath sounds: Normal breath sounds.  Abdominal:     General: Abdomen is flat.     Palpations: Abdomen is soft.     Tenderness: There is abdominal tenderness (Epigastric).  Musculoskeletal:        General: No swelling.     Cervical back: Neck supple. No tenderness.     Right lower leg: No edema.     Left lower leg: No edema.  Skin:    General: Skin is warm and dry.     Capillary Refill: Capillary refill takes less than 2 seconds.     Findings: No bruising.  Neurological:     Mental Status: She is alert.     Cranial Nerves: No cranial  nerve deficit.     Sensory: No sensory deficit.     Motor: No weakness.     Coordination: Coordination normal.  Psychiatric:        Mood and Affect: Mood normal.    ED Results / Procedures / Treatments   Labs (all labs ordered are listed, but only abnormal results are displayed) Labs Reviewed  COMPREHENSIVE METABOLIC PANEL - Abnormal; Notable for the following components:      Result Value   Potassium 3.2 (*)    Glucose, Bld 102 (*)    BUN <5 (*)    All other components within normal limits  CBC WITH DIFFERENTIAL/PLATELET - Abnormal; Notable for the following components:   WBC 10.8 (*)    RBC 3.82 (*)    Hemoglobin 10.6 (*)    HCT 33.2 (*)    RDW 18.8 (*)    Neutro Abs 8.7 (*)    All other components within normal limits  I-STAT BETA HCG BLOOD, ED ([facility], [facility], [facility] ONLY) - Abnormal; Notable for the following components:   I-stat hCG, quantitative 6.8 (*)    All other components within normal limits  PROTIME-INR  LIPASE, BLOOD  TYPE AND SCREEN  TROPONIN I (HIGH SENSITIVITY)    EKG None  Radiology No results found.  Medications Ordered in ED Medications  lactated ringers bolus 1,000 mL (1,000 mLs Intravenous New Bag/Given 06/30/22 1958)    ED Course/ Medical Decision Making/ A&P  Medical Decision Making Amount and/or Complexity of Data Reviewed Labs: ordered.   Patient presents hemodynamically stable, slightly tachycardic.  On exam she has epigastric tenderness.  Chest pain is not reproducible.  She has no focal neurofindings.  Patient's labs significant for slight anemia of 10.6, no significant drop from previous few months ago.  She only has slight leukocytosis of 10.8.  No significant electrolyte abnormalities.  {Document critical care time when appropriate:1} {Document review of labs and clinical decision tools ie heart score, Chads2Vasc2 etc:1}  {Document your independent review of radiology images, and any outside records:1} {Document your discussion with  family members, caretakers, and with consultants:1} {Document social determinants of health affecting pt's care:1} {Document your decision making why or why not admission, treatments were needed:1} Final Clinical Impression(s) / ED Diagnoses Final diagnoses:  None    Rx / DC Orders ED Discharge Orders     None

## 2022-06-30 NOTE — ED Triage Notes (Signed)
Pt arrived via POV with a c/o blood in stool.It only happens when she has a bowl movement and is compared to having a menstrual cycle but in her rectum.C/O of dizziness and chest hurting under (L) breast area.  Pt did visit  the ED in March with the same symptoms and is supposed to see a Solicitor on the 23rd.

## 2022-07-02 ENCOUNTER — Ambulatory Visit (INDEPENDENT_AMBULATORY_CARE_PROVIDER_SITE_OTHER): Payer: BC Managed Care – PPO | Admitting: Student

## 2022-07-02 ENCOUNTER — Encounter: Payer: Self-pay | Admitting: Nurse Practitioner

## 2022-07-02 VITALS — BP 117/83 | HR 100 | Temp 98.0°F | Ht 61.0 in | Wt 208.1 lb

## 2022-07-02 DIAGNOSIS — K644 Residual hemorrhoidal skin tags: Secondary | ICD-10-CM | POA: Diagnosis not present

## 2022-07-02 DIAGNOSIS — D500 Iron deficiency anemia secondary to blood loss (chronic): Secondary | ICD-10-CM

## 2022-07-02 DIAGNOSIS — E876 Hypokalemia: Secondary | ICD-10-CM

## 2022-07-02 DIAGNOSIS — Z1159 Encounter for screening for other viral diseases: Secondary | ICD-10-CM

## 2022-07-02 DIAGNOSIS — E559 Vitamin D deficiency, unspecified: Secondary | ICD-10-CM

## 2022-07-02 DIAGNOSIS — K921 Melena: Secondary | ICD-10-CM | POA: Diagnosis not present

## 2022-07-02 DIAGNOSIS — Z0000 Encounter for general adult medical examination without abnormal findings: Secondary | ICD-10-CM

## 2022-07-02 MED ORDER — POTASSIUM CHLORIDE CRYS ER 20 MEQ PO TBCR
20.0000 meq | EXTENDED_RELEASE_TABLET | Freq: Two times a day (BID) | ORAL | 0 refills | Status: DC
Start: 2022-07-02 — End: 2022-07-23

## 2022-07-02 MED ORDER — FERROUS SULFATE 324 (65 FE) MG PO TBEC
1.0000 | DELAYED_RELEASE_TABLET | ORAL | 1 refills | Status: DC
Start: 2022-07-02 — End: 2022-09-03

## 2022-07-02 NOTE — Patient Instructions (Addendum)
Thank you, Kerry Chavez for allowing [place] to provide your care today. Today we discussed your recent ER visit, hypokalemia and vitamin D deficiency. Please makes ure to follow up with GI tomorrow.   I have ordered the following labs for you:   Lab Orders         CBC no Diff         Vitamin D (25 hydroxy)         Hepatitis C Ab reflex to Quant PCR      I will call if any are abnormal. All of your labs can be accessed through "My Chart".  I have ordered the following medication/changed the following medications:  Take KCl 20 mEq twice daily for 2 days Resume your iron supplement every other day.   My Chart Access: [email or web address]  Please follow-up in 2-3 months or as needed  Please make sure to arrive 15 minutes prior to your next appointment. If you arrive late, you may be asked to reschedule.    We look forward to seeing you next time. Please call our clinic at [phone] if you have any questions or concerns. The best time to call is Monday-Friday from 9am-4pm, but there is someone available 24/7. If after hours or the weekend, call the main hospital number and ask for the Internal Medicine Resident On-Call. If you need medication refills, please notify your pharmacy one week in advance and they will send [place] a request.   Thank you for letting us take part in your care. Wishing you the best!  Kerry Rainwater, MD 07/02/2022, 4:36 PM IM Resident, PGY-3 Duwayne Heck 41:10

## 2022-07-02 NOTE — Progress Notes (Unsigned)
CC: ER follow-up  HPI:  [name] is a 37 y.o. female with PMH as below who presents to clinic for follow-up after recent visit to the ER for bloody stools. Please see problem based charting for evaluation, assessment and plan.  Past Medical History:  Diagnosis Date   Anxiety    Asthma    Depression     Review of Systems:  Constitutional: Positive for dizziness and fatigue.  Eyes: Negative for visual changes Respiratory: Negative for shortness of breath Cardiac: Negative for chest pain or palpitations Abdomen: Positive for bloody stools. Negative for abdominal pain, constipation or diarrhea Neuro: Positive for headache.  Negative for weakness, numbness or tingling  Physical Exam: General: Pleasant, well-appearing middle-aged woman. No acute distress. HEENT: Pale conjunctiva.  Moist mucous membrane. Cardiac: Tachycardic. Regular rhythm. No murmurs, rubs or gallops. No LE edema Respiratory: Lungs CTAB. No wheezing or crackles. Abdominal: Soft, symmetric and non tender. Normal BS. Skin: Warm, dry and intact without rashes or lesions Extremities: Atraumatic.  Radial and DP pulses 2+ and symmetric. Neuro: A&O x 3. Moves all extremities. Normal sensation to gross touch. Psych: Appropriate mood and affect.  Vitals:   07/02/22 1553  BP: 117/83  Pulse: 100  Temp: 98 F (36.7 C)  TempSrc: Oral  SpO2: 100%  Weight: 208 lb 1.6 oz (94.4 kg)  Height: 5\' 1"  (1.549 m)    Assessment & Plan:   Bloody stools Ms. Kerry Chavez is presenting today for repeat blood work after presenting to the ER 2 days ago for bloody stools. Last week, patient states she had some Kerry Chavez loose stools with bright red blood in the toilet bowl. She had 1-2 episodes a day and she presented to the ER after she started having some dizziness, epigastric pain and worsening headache. She reports having similar symptoms in March causing her to stop taking her iron pills. She continues to have some dizziness and  fatigue but denies any chest pain, shortness of breath, abdominal pain, vaginal bleeding, hematuria or vision changes. On exam, she has pale conjunctiva and tachycardic but otherwise reassuring exam with normal BP. Patient reports she was told she has 1 internal hemorrhoid in the ER. On chart review, patient was found to have an external hemorrhoids during a clinic follow-up for similar presentation to the ER last month. CBC today shows stable hemoglobin of 10.8, baseline 10.5-11.5. Patient's bloody stools consistent with a lower GI bleed, likely from her hemorrhoids, however this is her second episode and she has become symptomatic from this so she will need further evaluation by GI. Patient has an appointment with [facility] tomorrow. Patient is still iron deficient from very abnormal uterine breathing so I have advised her to resume her iron supplementations every other day. I will send her prescription for MiraLAX to take as needed for constipation.  Plan: -Follow-up with GI as scheduled -Resume ferrous sulfate every other day -MiraLAX 17 g daily as needed for constipation  Vitamin D deficiency Repeat vitamin D levels improved but still low at 21.8. -Resume vitamin D 1000 units daily  Iron deficiency anemia due to chronic blood loss Patient states she stopped taking her iron supplements in March thinking it was causing her bloody stools.  I advised her to resume ferrous sulfate every other day since she is at risk of further decrease in her iron levels in the setting of her abnormal uterine bleeding.  I started a bowel regimen to decrease her risk of constipation. -Resume ferrous sulfate 1 tablet  every other day -As needed MiraLAX for constipation  Healthcare maintenance Hepatitis C screening negative today  Hypokalemia During patient's ER visit 2 days ago, CMP showed potassium of 3.2. Patient states she was not given any potassium supplementation.  EKG showed normal sinus with some flattening  of the T waves.  Discussed with patient plan to send potassium supplements for a few days. -Start KCl 20 mEq twice daily for 2 days -Repeat BMP at next office visit, check Mag if K+ still low    See Encounters Tab for problem based charting.  Patient discussed with Dr.  Curt Bears, MD, MPH

## 2022-07-03 ENCOUNTER — Encounter: Payer: Self-pay | Admitting: Student

## 2022-07-03 DIAGNOSIS — E876 Hypokalemia: Secondary | ICD-10-CM | POA: Insufficient documentation

## 2022-07-03 DIAGNOSIS — K625 Hemorrhage of anus and rectum: Secondary | ICD-10-CM | POA: Diagnosis not present

## 2022-07-03 DIAGNOSIS — Z8673 Personal history of transient ischemic attack (TIA), and cerebral infarction without residual deficits: Secondary | ICD-10-CM | POA: Diagnosis not present

## 2022-07-03 DIAGNOSIS — K921 Melena: Secondary | ICD-10-CM | POA: Insufficient documentation

## 2022-07-03 DIAGNOSIS — E668 Other obesity: Secondary | ICD-10-CM | POA: Diagnosis not present

## 2022-07-03 LAB — CBC
Hematocrit: 35 % (ref 34.0–46.6)
Hemoglobin: 10.8 g/dL — ABNORMAL LOW (ref 11.1–15.9)
MCH: 27.1 pg (ref 26.6–33.0)
MCHC: 30.9 g/dL — ABNORMAL LOW (ref 31.5–35.7)
MCV: 88 fL (ref 79–97)
Platelets: 376 10*3/uL (ref 150–450)
RBC: 3.98 x10E6/uL (ref 3.77–5.28)
RDW: 16.9 % — ABNORMAL HIGH (ref 11.7–15.4)
WBC: 9.4 10*3/uL (ref 3.4–10.8)

## 2022-07-03 LAB — HCV AB W REFLEX TO QUANT PCR: HCV Ab: NONREACTIVE

## 2022-07-03 LAB — HCV INTERPRETATION

## 2022-07-03 LAB — VITAMIN D 25 HYDROXY (VIT D DEFICIENCY, FRACTURES): Vit D, 25-Hydroxy: 21.8 ng/mL — ABNORMAL LOW (ref 30.0–100.0)

## 2022-07-03 MED ORDER — POLYETHYLENE GLYCOL 3350 17 G PO PACK
17.0000 g | PACK | Freq: Every day | ORAL | 1 refills | Status: DC | PRN
Start: 2022-07-03 — End: 2022-11-08

## 2022-07-03 MED ORDER — VITAMIN D 25 MCG (1000 UNIT) PO TABS
1000.0000 [IU] | ORAL_TABLET | Freq: Every day | ORAL | 1 refills | Status: DC
Start: 2022-07-03 — End: 2024-02-06

## 2022-07-03 NOTE — Assessment & Plan Note (Signed)
Hepatitis C screening negative today 

## 2022-07-03 NOTE — Assessment & Plan Note (Signed)
Kerry Chavez is presenting today for repeat blood work after presenting to the ER 2 days ago for bloody stools. Last week, patient states she had some [place] loose stools with bright red blood in the toilet bowl. She had 1-2 episodes a day and she presented to the ER after she started having some dizziness, epigastric pain and worsening headache. She reports having similar symptoms in March causing her to stop taking her iron pills. She continues to have some dizziness and fatigue but denies any chest pain, shortness of breath, abdominal pain, vaginal bleeding, hematuria or vision changes. On exam, she has pale conjunctiva and tachycardic but otherwise reassuring exam with normal BP. Patient reports she was told she has 1 internal hemorrhoid in the ER. On chart review, patient was found to have an external hemorrhoids during a clinic follow-up for similar presentation to the ER last month. CBC today shows stable hemoglobin of 10.8, baseline 10.5-11.5. Patient's bloody stools consistent with a lower GI bleed, likely from her hemorrhoids, however this is her second episode and she has become symptomatic from this so she will need further evaluation by GI. Patient has an appointment with [facility] tomorrow. Patient is still iron deficient from very abnormal uterine breathing so I have advised her to resume her iron supplementations every other day. I will send her prescription for MiraLAX to take as needed for constipation.  Plan: -Follow-up with GI as scheduled -Resume ferrous sulfate every other day -MiraLAX 17 g daily as needed for constipation

## 2022-07-03 NOTE — Assessment & Plan Note (Signed)
Patient states she stopped taking her iron supplements in March thinking it was causing her bloody stools.  I advised her to resume ferrous sulfate every other day since she is at risk of further decrease in her iron levels in the setting of her abnormal uterine bleeding.  I started a bowel regimen to decrease her risk of constipation. -Resume ferrous sulfate 1 tablet every other day -As needed MiraLAX for constipation

## 2022-07-03 NOTE — Assessment & Plan Note (Signed)
During patient's ER visit 2 days ago, CMP showed potassium of 3.2. Patient states she was not given any potassium supplementation.  EKG showed normal sinus with some flattening of the T waves.  Discussed with patient plan to send potassium supplements for a few days. -Start KCl 20 mEq twice daily for 2 days -Repeat BMP at next office visit, check Mag if K+ still low

## 2022-07-03 NOTE — Assessment & Plan Note (Signed)
Repeat vitamin D levels improved but still low at 21.8. -Resume vitamin D 1000 units daily

## 2022-07-04 ENCOUNTER — Telehealth: Payer: Self-pay

## 2022-07-04 NOTE — Telephone Encounter (Signed)
Requesting lab results, please call pt back.  

## 2022-07-04 NOTE — Telephone Encounter (Signed)
Thanks. Spoke with patient.

## 2022-07-06 ENCOUNTER — Ambulatory Visit (INDEPENDENT_AMBULATORY_CARE_PROVIDER_SITE_OTHER): Payer: BC Managed Care – PPO | Admitting: Pediatric Genetics

## 2022-07-08 ENCOUNTER — Emergency Department (HOSPITAL_COMMUNITY)
Admission: EM | Admit: 2022-07-08 | Discharge: 2022-07-08 | Disposition: A | Discharge status: Home / Self Care | Payer: BC Managed Care – PPO | Attending: Emergency Medicine | Admitting: Emergency Medicine

## 2022-07-08 ENCOUNTER — Other Ambulatory Visit: Payer: Self-pay

## 2022-07-08 ENCOUNTER — Ambulatory Visit
Admission: EM | Admit: 2022-07-08 | Discharge: 2022-07-08 | Disposition: A | Discharge status: Home / Self Care | Payer: BC Managed Care – PPO | Attending: Urgent Care | Admitting: Urgent Care

## 2022-07-08 ENCOUNTER — Encounter (HOSPITAL_COMMUNITY): Payer: Self-pay | Admitting: Emergency Medicine

## 2022-07-08 ENCOUNTER — Emergency Department (HOSPITAL_COMMUNITY): Payer: BC Managed Care – PPO

## 2022-07-08 DIAGNOSIS — R0789 Other chest pain: Secondary | ICD-10-CM

## 2022-07-08 DIAGNOSIS — R110 Nausea: Secondary | ICD-10-CM

## 2022-07-08 DIAGNOSIS — K219 Gastro-esophageal reflux disease without esophagitis: Secondary | ICD-10-CM

## 2022-07-08 DIAGNOSIS — R079 Chest pain, unspecified: Secondary | ICD-10-CM | POA: Diagnosis not present

## 2022-07-08 HISTORY — DX: Other nontraumatic subarachnoid hemorrhage: I60.8

## 2022-07-08 LAB — CBC
HCT: 32.8 % — ABNORMAL LOW (ref 36.0–46.0)
Hemoglobin: 10.7 g/dL — ABNORMAL LOW (ref 12.0–15.0)
MCH: 28 pg (ref 26.0–34.0)
MCHC: 32.6 g/dL (ref 30.0–36.0)
MCV: 85.9 fL (ref 80.0–100.0)
Platelets: 350 10*3/uL (ref 150–400)
RBC: 3.82 MIL/uL — ABNORMAL LOW (ref 3.87–5.11)
RDW: 18.4 % — ABNORMAL HIGH (ref 11.5–15.5)
WBC: 9.3 10*3/uL (ref 4.0–10.5)
nRBC: 0 % (ref 0.0–0.2)

## 2022-07-08 LAB — D-DIMER, QUANTITATIVE: D-Dimer, Quant: 0.45 ug/mL-FEU (ref 0.00–0.50)

## 2022-07-08 LAB — BASIC METABOLIC PANEL
Anion gap: 8 (ref 5–15)
BUN: 6 mg/dL (ref 6–20)
CO2: 21 mmol/L — ABNORMAL LOW (ref 22–32)
Calcium: 9 mg/dL (ref 8.9–10.3)
Chloride: 108 mmol/L (ref 98–111)
Creatinine, Ser: 0.69 mg/dL (ref 0.44–1.00)
GFR, Estimated: >60 mL/min (ref >60)
Glucose, Bld: 96 mg/dL (ref 70–99)
Potassium: 4.2 mmol/L (ref 3.5–5.1)
Sodium: 137 mmol/L (ref 135–145)

## 2022-07-08 LAB — I-STAT BETA HCG BLOOD, ED (MC, WL, AP ONLY): I-stat hCG, quantitative: 8.8 m[IU]/mL — ABNORMAL HIGH (ref <5)

## 2022-07-08 LAB — TROPONIN I (HIGH SENSITIVITY)
Troponin I (High Sensitivity): <2 ng/L (ref <18)
Troponin I (High Sensitivity): <2 ng/L (ref <18)
Troponin I (High Sensitivity): <2 ng/L (ref <18)

## 2022-07-08 LAB — HCG, SERUM, QUALITATIVE: Preg, Serum: NEGATIVE

## 2022-07-08 MED ORDER — CYCLOBENZAPRINE HCL 5 MG PO TABS: 5.0000 mg | ORAL_TABLET | Freq: Three times a day (TID) | ORAL | 0 refills | Status: DC | PRN

## 2022-07-08 MED ORDER — ACETAMINOPHEN 325 MG PO TABS: 650.0000 mg | ORAL_TABLET | Freq: Four times a day (QID) | ORAL | 0 refills | Status: DC | PRN

## 2022-07-08 MED ORDER — ALUM & MAG HYDROXIDE-SIMETH 200-200-20 MG/5ML PO SUSP
30.0000 mL | Freq: Once | ORAL | Status: AC
  Administered 2022-07-08: 30 mL via ORAL
  Filled 2022-07-08: qty 30

## 2022-07-08 MED ORDER — ESOMEPRAZOLE MAGNESIUM 20 MG PO CPDR: 20.0000 mg | DELAYED_RELEASE_CAPSULE | Freq: Every day | ORAL | 1 refills | Status: DC

## 2022-07-08 MED ORDER — LIDOCAINE VISCOUS HCL 2 % MT SOLN
15.0000 mL | Freq: Once | OROMUCOSAL | Status: AC
  Administered 2022-07-08: 15 mL via ORAL
  Filled 2022-07-08: qty 15

## 2022-07-08 NOTE — ED Provider Notes (Signed)
[facility] - URGENT CARE CENTER  Note:  This document was prepared using Conservation officer, historic buildings and may include unintentional dictation errors.  MRN: [id number] DOB: 06/08/1985  Subjective:   Kerry Chavez is a 37 y.o. female presenting for 4-day history of acute onset persistent moderate left-sided chest pain, burning sensation.  Symptoms radiate toward the back, into the left leg and left arm.  Has also had upper abdominal pain, epigastric pain and palpitations, heart racing sensation associated with this.  Had an intracranial hemorrhage, aneurysmal subarachnoid hemorrhage. No history of heart disease, MI.  No history of heart disease, MI.  Patient is no longer smoker.  No history of diabetes.  Does not take medications for hypertension or dyslipidemia.  No drug use.  She does have a history of acid reflux and takes famotidine for this.  No current facility-administered medications for this encounter.  Current Outpatient Medications:    acetaminophen (TYLENOL) 500 MG tablet, Take 500 mg by mouth every 6 (six) hours as needed for headache., Disp: , Rfl:    busPIRone (BUSPAR) 5 MG tablet, Take 1 tablet (5 mg total) by mouth 3 (three) times daily. (Patient taking differently: Take 5 mg by mouth 2 (two) times daily.), Disp: 90 tablet, Rfl: 2   cholecalciferol (VITAMIN D3) 25 MCG (1000 UNIT) tablet, Take 1 tablet (1,000 Units total) by mouth daily., Disp: 90 tablet, Rfl: 1   diphenhydrAMINE (BENADRYL) 25 MG tablet, Take 25 mg by mouth every 6 (six) hours as needed for sleep (anxiety)., Disp: , Rfl:    escitalopram (LEXAPRO) 5 MG tablet, Take 1 tablet (5 mg total) by mouth daily., Disp: 30 tablet, Rfl: 2   famotidine (PEPCID) 20 MG tablet, Take 1 tablet (20 mg total) by mouth 2 (two) times daily. (Patient not taking: Reported on 06/30/2022), Disp: 30 tablet, Rfl: 0   ferrous sulfate 324 (65 Fe) MG TBEC, Take 1 tablet (325 mg total) by mouth every other day., Disp: 90 tablet, Rfl: 1    Fructose-Dextrose-Phosphor Acd (NAUSEA CONTROL PO), Take 1 tablet by mouth daily as needed (nausea and vomiting)., Disp: , Rfl:    polyethylene glycol (MIRALAX) 17 g packet, Take 17 g by mouth daily as needed., Disp: 30 each, Rfl: 1   potassium chloride SA (KLOR-CON M) 20 MEQ tablet, Take 1 tablet (20 mEq total) by mouth 2 (two) times daily for 2 days., Disp: 4 tablet, Rfl: 0   Allergies  Allergen Reactions   Shellfish Allergy Swelling   Ivp Dye [Iodinated Contrast Media] Other (See Comments)    Unknown reaction   Penicillins Hives, Itching and Swelling    REACTION: rash   Sulfamethoxazole Hives and Swelling    REACTION: rash   Latex Itching and Rash    With condoms only    Past Medical History:  Diagnosis Date   Aneurysmal subarachnoid hemorrhage    Anxiety    Asthma    Depression      Past Surgical History:  Procedure Laterality Date   CESAREAN SECTION     CESAREAN SECTION WITH BILATERAL TUBAL LIGATION Bilateral 06/12/2014   Procedure: Repeat CESAREAN SECTION WITH BILATERAL TUBAL LIGATION;  Surgeon: Maxie Better, MD;  Location: [facility];  Service: Obstetrics;  Laterality: Bilateral;  EDD: 06/19/14 Wants clear C-Section drape for this case   IR ANGIO INTRA EXTRACRAN SEL INTERNAL CAROTID UNI L MOD SED  12/21/2021   IR ANGIO INTRA EXTRACRAN SEL INTERNAL CAROTID UNI R MOD SED  12/21/2021   IR ANGIO INTRA  EXTRACRAN SEL INTERNAL CAROTID UNI R MOD SED  06/26/2022   IR ANGIO VERTEBRAL SEL VERTEBRAL BILAT MOD SED  12/21/2021   IR ANGIOGRAM FOLLOW UP STUDY  12/21/2021   IR ANGIOGRAM FOLLOW UP STUDY  12/21/2021   IR TRANSCATH/EMBOLIZ  12/21/2021   IR US GUIDE VASC ACCESS RIGHT  06/26/2022   RADIOLOGY WITH ANESTHESIA N/A 12/21/2021   Procedure: IR WITH ANESTHESIA;  Surgeon: Lisbeth Renshaw, MD;  Location: [facility] OR;  Service: Radiology;  Laterality: N/A;   TONSILLECTOMY AND ADENOIDECTOMY  1998    Family History  Problem Relation Age of Onset   Diabetes Mother    Cerebral aneurysm Father     CVA Father     Social History   Tobacco Use   Smoking status: Former    Packs/day: 0.25    Years: 10.00    Additional pack years: 0.00    Total pack years: 2.50    Types: Cigarettes    Quit date: 12/20/2021    Years since quitting: 0.5    Passive exposure: Past   Smokeless tobacco: Never  Vaping Use   Vaping Use: Never used  Substance Use Topics   Alcohol use: Yes    Comment: Socially   Drug use: Not Currently    ROS   Objective:   Vitals: BP 116/79 (BP Location: Right Arm)   Pulse 95   Temp 98.6 F (37 C) (Oral)   Resp 18   LMP 06/12/2022 (Approximate)   SpO2 98%   Physical Exam Constitutional:      General: She is not in acute distress.    Appearance: Normal appearance. She is well-developed. She is not ill-appearing, toxic-appearing or diaphoretic.  HENT:     Head: Normocephalic and atraumatic.     Right Ear: External ear normal.     Left Ear: External ear normal.     Nose: Nose normal.     Mouth/Throat:     Mouth: Mucous membranes are moist.  Eyes:     General: No scleral icterus.       Right eye: No discharge.        Left eye: No discharge.     Extraocular Movements: Extraocular movements intact.     Conjunctiva/sclera: Conjunctivae normal.  Cardiovascular:     Rate and Rhythm: Normal rate and regular rhythm.     Heart sounds: Normal heart sounds. No murmur heard.    No friction rub. No gallop.  Pulmonary:     Effort: Pulmonary effort is normal. No respiratory distress.     Breath sounds: No stridor. No wheezing, rhonchi or rales.  Chest:     Chest wall: No tenderness.  Abdominal:     General: Bowel sounds are normal. There is no distension.     Palpations: Abdomen is soft. There is no mass.     Tenderness: There is abdominal tenderness (upper). There is no right CVA tenderness, left CVA tenderness, guarding or rebound.  Skin:    General: Skin is warm and dry.  Neurological:     General: No focal deficit present.     Mental Status: She is  alert and oriented to person, place, and time.     Cranial Nerves: No cranial nerve deficit.     Motor: No weakness.     Coordination: Coordination normal.     Gait: Gait normal.  Psychiatric:        Mood and Affect: Mood normal.        Behavior: Behavior normal.  Thought Content: Thought content normal.        Judgment: Judgment normal.    ED ECG REPORT   Date: 07/08/2022  EKG Time: 1:53 PM  Rate: 98bpm  Rhythm: normal sinus rhythm,  unchanged from previous tracings  Axis: normal  Intervals:none  ST&T Change: T-wave flattening in III, V2.   Narrative Interpretation: Sinus rhythm at 98bpm, non-specific t-wave changes as above. Similar to prior ecg from last week.    Assessment and Plan :   PDMP not reviewed this encounter.  1. Atypical chest pain   2. Gastroesophageal reflux disease, unspecified whether esophagitis present   3. Nausea without vomiting     Patient has atypical chest pain, symptoms consistent with unstable angina.  There is no sign of an acute cardiopulmonary event, ACS on her EKG in clinic.  However, given her recent history of stroke, cerebral aneurysm emphasized need for further evaluation and management that we can provide in the urgent care setting.  Patient is going to present to the emergency room for further cardiac workup.  She is hemodynamically stable and can present by personal vehicle.  I did place a referral to cardiology for further follow-up.  I also advised that she start Nexium and use Tylenol, Flexeril for any musculoskeletal pains.  Follow-up with PCP otherwise.   Wallis Bamberg, [place] 07/08/22 1439

## 2022-07-08 NOTE — ED Triage Notes (Signed)
Pt reports 4 days of left sided chest pain, tightness, burning, reports some SOB with activity. Pt also reports hx of anxiety. Pt reports hx of reflux, didn't take her medication today.

## 2022-07-08 NOTE — ED Notes (Signed)
Patient is being discharged from the Urgent Care and sent to the Emergency Department via POV . Per Urban Gibson, PA-C, patient is in need of higher level of care due to CP. Patient is aware and verbalizes understanding of plan of care.  Vitals:   07/08/22 1323  BP: 116/79  Pulse: 95  Resp: 18  Temp: 98.6 F (37 C)  SpO2: 98%

## 2022-07-08 NOTE — ED Provider Notes (Signed)
[place] EMERGENCY DEPARTMENT AT [facility] Provider Note  Medical Decision Making   HPI: Kerry Chavez is a 37 y.o. female with history perinent for GERD, obesity, hemorrhoids, anxiety, prior aneurysmal subarachnoid hemorrhage who presents complaining of chest pain. Patient arrived via POV accompanied by daughter.  History provided by patient and daughter at bedside.  No interpreter required for this encounter.  Patient reports that she is presenting for evaluation of chest pain.  States that her daughter, who is at bedside, works at [employer], and feels that her symptoms are primarily related to anxiety.  Patient reports that she will intermittently feel chest tightness when she is anxious.  Additionally will imminently have burning pain in her epigastrium and chest, states that this is consistent with her known GERD for which she takes Pepcid.  Also reports that she has been having new/different chest pain over the past 4 days, reports that it is central, radiates to her left chest and intermittently to her bilateral arms.  Denies radiation to neck or back on my history (notably this is different from documented history from urgent care visit earlier today during which patient reportedly endorsed radiation to the back and left leg, but not the right arm).  Patient notes that she has been painting her house over the past week.  Reports that chest pain is worse with movement and palpation of the chest wall.  Reports that she has been seen in the ED as well as urgent care and with PCP numerous times for chest pain, does not feel like she has received definitive diagnosis.  Reports that she was seen in urgent care earlier today, reports that her understanding was that the provider told her that she could receive a muscle relaxer for her pain, however she should also be seen in the emergency department.  ROS: As per HPI. Please see MAR for complete past medical history, surgical  history, and social history.   Physical exam is pertinent for tenderness to palpation of anterior chest wall, mild tachycardia..   The differential includes but is not limited to ACS, arrhythmia, pericardial tamponade, pericarditis, myocarditis, pneumonia, pneumothorax, esophageal, tear, perforated abdominal viscous, pulmonary embolism, aortic dissection, costochondritis, musculoskeletal chest wall pain, GERD.  Additional history obtained from: Review, daughter at bedside External records from outside source obtained and reviewed including: Urgent care note, prior ED notes  ED provider interpretation of ECG: Rate 102, sinus rhythm, nonspecific T wave inversion in lead V1  ED provider interpretation of radiology/imaging: Chest x-ray without focal airspace opacification, cardiomediastinal silhouette derangement, pleural effusion, pneumothorax, bony displacement.  Labs ordered were interpreted by myself as well as my attending and were incorporated into the medical decision making process for this patient.  ED provider interpretation of labs: Dimer WNL, troponins x3 <2.  Quantitative serum hCG very mildly elevated, however qualitative hCG negative.  CBC with out leukocytosis or thrombocytopenia.  Mild stable anemia on comparison to prior.  BMP without AKI or emergent electrolyte derangement.  Interventions: Maalox  See the EMR for full details regarding lab and imaging results.  Kerry Chavez is awake, alert, and HDS.  Her exam is most notable for mild tachycardia and tenderness to palpation on the anterior chest wall.  The ECG reveals no anatomical ischemia representing STEMI, New-Onset Arrhythmia, or ischemic equivalent.  She has been risk stratified with a HEAR score of 2. Initial troponin is <2; delta troponin is <2.  The patient's presentation, the patient being hemodynamically stable, and the ECG  are not consistent with Pericardial Tamponade. The patient's pain is not positional. This in  conjunction with the lack of PR depressions and ST elevations on the ECG are reassuring against Pericarditis. The patient's non-elevated troponin and ECG are also inconsistent with Myocarditis.  The CXR is unremarkable for focal airspace disease.  The patient is afebrile and denies productive cough.  Therefore, I do not suspect Pneumonia. There is no evidence of Pneumothorax on physical exam or on the CXR. CXR shows no evidence of Esophageal Tear and there is no recent intractable emesis or esophageal instrumentation. There is no peritonitis or free air on CXR worrisome for a Perforated Abdominal Viscous.  Pulmonary Embolism is on the differential. The patient is at  risk via the Revised Geneva Criteria. Therefore, we will further risk stratify the patient with a d-dimer.  This was WNL. Therefore, a CTA not indicated.  The patient's pain is not tearing and it does not radiate to back. Pulses are present bilaterally in both the upper and lower extremities. CXR does not show a widened mediastinum. I have a very low suspicion for Aortic Dissection.   While patient was in the ED she had recurrence of some burning chest pain reminiscent of her reflux, improved with Maalox.  Overall, feel that patient likely has multifactorial chest pain.  Patient reports that since having her aneurysmal SAH she has developed significant healthcare anxiety.  Discussed this extensively with patient at bedside, discussed that this is reasonable given she had a low probability event occur that she would be experiencing anxiety, however that her labs are reassuring against myocardial infarction at this time.  Discussed components of her chest pain may be related to anxiety, also discussed that the burning chest pain may be related to her known GERD, particularly given that this symptom improved with Maalox.  Finally, with regard to the new/different chest pain that she has been experiencing over the past 4 days, most likely  musculoskeletal in origin given temporal correlation to increased MSK usage (painting her house) and tenderness to palpation.  Discussed that workup reassuring against emergent etiology.  Discussed Tylenol and ibuprofen for MSK pain control.   Consults: None  Disposition: DISCHARGE: I believe that the patient is safe for discharge home with outpatient follow-up. Patient was informed of all pertinent physical exam, laboratory, and imaging findings. Patient's suspected etiology of their symptom presentation was discussed with the patient and all questions were answered. We discussed following up with PCP, cardiology, gastroenterology. I provided thorough ED return precautions. The patient feels safe and comfortable with this plan.  The plan for this patient was discussed with Dr. Wilkie Aye, who voiced agreement and who oversaw evaluation and treatment of this patient.  Clinical Impression:  1. Other chest pain    Discharge  Therapies: These medications and interventions were provided for the patient while in the ED. Medications  alum & mag hydroxide-simeth (MAALOX/MYLANTA) 200-200-20 MG/5ML suspension 30 mL (30 mLs Oral Given 07/08/22 1818)    And  lidocaine (XYLOCAINE) 2 % viscous mouth solution 15 mL (15 mLs Oral Given 07/08/22 1818)    MDM generated using voice dictation software and may contain dictation errors.  Please contact me for any clarification or with any questions.  Clinical Complexity A medically appropriate history, review of systems, and physical exam was performed.  Collateral history obtained from: Chart review, daughter I personally reviewed the labs, EKG, imaging as discussed above. Patient's presentation is most consistent with acute complicated illness / injury requiring diagnostic  workup Considered and ruled out life and body threatening conditions  Treatment: Outpatient follow-up Medications: OTC Discussed patient's care with providers from the following different  specialties: None  Physical Exam   ED Triage Vitals  Enc Vitals Group     BP 07/08/22 1508 115/87     Pulse Rate 07/08/22 1508 (!) 102     Resp 07/08/22 1508 19     Temp 07/08/22 1508 98.2 F (36.8 C)     Temp Source 07/08/22 1508 Oral     SpO2 07/08/22 1508 100 %     Weight 07/08/22 1532 208 lb (94.3 kg)     Height 07/08/22 1532  (1.549 m)     Head Circumference --      Peak Flow --      Pain Score 07/08/22 1531 6     Pain Loc --      Pain Edu? --      Excl. in GC? --      Physical Exam Vitals and nursing note reviewed.  Constitutional:      General: She is not in acute distress.    Appearance: She is well-developed.  HENT:     Head: Normocephalic and atraumatic.  Eyes:     Extraocular Movements: Extraocular movements intact.     Conjunctiva/sclera: Conjunctivae normal.  Cardiovascular:     Rate and Rhythm: Regular rhythm. Tachycardia present.     Pulses:          Radial pulses are 2+ on the right side and 2+ on the left side.     Heart sounds: No murmur heard. Pulmonary:     Effort: Pulmonary effort is normal. No respiratory distress.     Breath sounds: Normal breath sounds.  Abdominal:     Palpations: Abdomen is soft.     Tenderness: There is no abdominal tenderness.  Musculoskeletal:        General: No swelling.     Cervical back: Neck supple.  Skin:    General: Skin is warm and dry.     Capillary Refill: Capillary refill takes less than 2 seconds.  Neurological:     Mental Status: She is alert.  Psychiatric:        Mood and Affect: Mood normal.       Procedure Note  Procedures  DG Chest 2 View  Final Result      Julianne Rice, MD Emergency Medicine, PGY-2   Curley Spice, MD 07/09/22 0112    Rozelle Logan, DO 07/09/22 2112

## 2022-07-08 NOTE — Discharge Instructions (Addendum)
Kerry Chavez  Thank you for allowing [place] to take care of you today.  You came to the Emergency Department today because you have been having chest pain and you are worried about your heart.  Here in the emergency department we checked your heart #3 times, it was normal, therefore we do not feel that you are having a heart attack currently.  We also checked a blood clot risk factor number, this was not elevated, therefore we do not think that you are having a blood clot in your lungs at this time.  We also not seeing anything abnormal on your chest x-ray such as pneumonia, air around your lungs, fluid around your lungs etc..  It is possible that your chest pain is caused by a variety of factors, since you have tenderness to your chest wall, it is possible that you are having muscular pain.  You can take Tylenol and ibuprofen for this.  You also likely have a component of reflux since your pain got better with Maalox.  You can buy Maalox over-the-counter.  You also have some significant anxiety related to the stroke that you had, it is possible that anxiety is contributing to your chest pain as well, however given your workup today, it is very unlikely that you have an emergency cause of your chest pain.  To-Do: 1. Please follow-up with your primary doctor within 2 days / as soon as possible.  Please return to the Emergency Department or call 911 if you experience have worsening of your symptoms, or do not get better, new or different chest pain, shortness of breath, severe or significantly worsening pain, high fever, severe confusion, pass out or have any reason to think that you need emergency medical care.   We hope you feel better soon.   Curley Spice, MD Department of Emergency Medicine [facility]

## 2022-07-08 NOTE — ED Triage Notes (Signed)
Pt reports she had pain in the upper back, left arm and left side chest pain, epigastric pain, palpitations x 4 days.   Pt reports she had an stroke and aneurysmal subarachnoid hemorrhage 12/20/21.

## 2022-07-09 DIAGNOSIS — I609 Nontraumatic subarachnoid hemorrhage, unspecified: Secondary | ICD-10-CM | POA: Diagnosis not present

## 2022-07-09 NOTE — Progress Notes (Signed)
Internal Medicine Clinic Attending  Case discussed with Dr. [place]onsah  At the time of the visit.  We reviewed the resident's history and exam and pertinent patient test results.  I agree with the assessment, diagnosis, and plan of care documented in the resident's note.  

## 2022-07-11 ENCOUNTER — Institutional Professional Consult (permissible substitution): Payer: BC Managed Care – PPO | Admitting: Neurology

## 2022-07-11 ENCOUNTER — Encounter: Payer: Self-pay | Admitting: Neurology

## 2022-07-18 ENCOUNTER — Telehealth (INDEPENDENT_AMBULATORY_CARE_PROVIDER_SITE_OTHER): Payer: Medicaid Other | Admitting: Licensed Clinical Social Worker

## 2022-07-18 ENCOUNTER — Ambulatory Visit (INDEPENDENT_AMBULATORY_CARE_PROVIDER_SITE_OTHER): Payer: Medicaid Other | Admitting: Licensed Clinical Social Worker

## 2022-07-18 DIAGNOSIS — F419 Anxiety disorder, unspecified: Secondary | ICD-10-CM

## 2022-07-18 NOTE — BH Specialist Note (Signed)
Integrated Behavioral Health via Telemedicine Visit  07/18/2022 Kerry Chavez [id number]  Number of Integrated Behavioral Health Clinician visits: 2- Second Visit  Session Start time: 1330   Session End time: 1430  Total time in minutes: 60   Referring Provider: Champ Mungo Patient/Family location: Home [facility] Provider location: [facility] All persons participating in visit: Patient only Types of Service: Individual psychotherapy and Video visit   I connected with [name]  Video Enabled Telemedicine Application  (Video is Caregility application) and verified that I am speaking with the correct person using two identifiers. Discussed confidentiality: Yes    I discussed the limitations of telemedicine and the availability of in person appointments.  Discussed there is a possibility of technology failure and discussed alternative modes of communication if that failure occurs.   I discussed that engaging in this telemedicine visit, they consent to the provision of behavioral healthcare.   Patient and/or legal guardian expressed understanding and consented to Telemedicine visit: Yes    Goals Addressed: Patient will:  Reduce symptoms of: anxiety      Progress towards Goals: Ongoing   Interventions: Interventions utilized:  Motivational Interviewing, Solution-Focused Strategies, and Mindfulness or Relaxation Training Standardized Assessments completed: PHQ-SADS       05/30/2022    1:33 PM 02/27/2022   12:07 PM 01/02/2022    2:27 PM  PHQ-SADS Last 3 Score only  Total GAD-7 Score Assessment: Patient reported favorable news from her most recent medical visits. Patient has additional appointments approaching, but states she will try not to worry.   Patient continues to work on specific techniques to decrease anxiety. [facility] explained "333 Rule" and deep breathing.   Patient reported journaling. Patient will continue to journal. Patient will also write herself  a letter of how proud she is of herself.    Patient and [facility] agreed to come up with goals for future  visits. Patient will write down goals and we will discuss on next visit.      Patient may benefit from Motivational Interviewing, Solution-Focused Strategies, and Mindfulness or Management consultant.   Plan: Follow up with behavioral health clinician on : 05/15 at 1:30 Pm.     I discussed the assessment and treatment plan with the patient and/or parent/guardian. They were provided an opportunity to ask questions and all were answered. They agreed with the plan and demonstrated an understanding of the instructions.   They were advised to call back or seek an in-person evaluation if the symptoms worsen or if the condition fails to improve as anticipated.   Christen Butter, MSW, LCSW-A She/Her Behavioral Health Clinician [facility]  Internal Medicine Center Direct [phone]  Fax [phone] Main Office Phone: [phone] [address] [place] [place], [place] [address] Website: [facility] Internal Medicine [facility]  [place], [place]  [place]

## 2022-07-18 NOTE — BH Specialist Note (Signed)
Error  Christen Butter, MSW, LCSW-A She/Her Behavioral Health Clinician [facility]  Internal Medicine Center Direct Dial:[phone]  Fax [phone] Main Office Phone: [phone] [address] [place] [place], [place] [address] Website: [facility] Internal Medicine [facility]  [place], [place]  [facility]

## 2022-07-23 ENCOUNTER — Ambulatory Visit: Payer: BC Managed Care – PPO | Admitting: Cardiology

## 2022-07-23 ENCOUNTER — Other Ambulatory Visit: Payer: BC Managed Care – PPO

## 2022-07-23 ENCOUNTER — Encounter: Payer: Self-pay | Admitting: Cardiology

## 2022-07-23 VITALS — BP 119/81 | HR 114 | Ht 61.0 in | Wt 205.0 lb

## 2022-07-23 DIAGNOSIS — R002 Palpitations: Secondary | ICD-10-CM

## 2022-07-23 DIAGNOSIS — R072 Precordial pain: Secondary | ICD-10-CM

## 2022-07-23 DIAGNOSIS — R079 Chest pain, unspecified: Secondary | ICD-10-CM

## 2022-07-23 NOTE — Progress Notes (Unsigned)
Patient referred by Champ Mungo, DO for chest pain  Subjective:   Kerry Chavez, female    DOB: Jul 27, 1985, 37 y.o.   MRN: [id number]   Chief Complaint  Patient presents with   Chest Pain   New Patient (Initial Visit)     HPI  37 y.o. African-American female with hypertension, h/o aneurysmal subarachnoid hemorrhage, anxiety, depression, referred for evaluation of chest pain.  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  She has made excellent neurological recovery, but has had significant anxiety depression since then.  She has episodes of chest pain that occur for few minutes, both at rest, but sometimes with exertion.  She is also noticed her heart rate goes very fast with minimal activity.  She has been struggling with anxiety and depression, sees a therapist and is also on medications for the same.  She does not smoke.   Past Medical History:  Diagnosis Date   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HCC)    Anxiety    Asthma    Depression      Past Surgical History:  Procedure Laterality Date   CESAREAN SECTION     CESAREAN SECTION WITH BILATERAL TUBAL LIGATION Bilateral 06/12/2014   Procedure: Repeat CESAREAN SECTION WITH BILATERAL TUBAL LIGATION;  Surgeon: Maxie Better, MD;  Location: [facility];  Service: Obstetrics;  Laterality: Bilateral;  EDD: 06/19/14 Wants clear C-Section drape for this case   IR ANGIO INTRA EXTRACRAN SEL INTERNAL CAROTID UNI L MOD SED  12/21/2021   IR ANGIO INTRA EXTRACRAN SEL INTERNAL CAROTID UNI R MOD SED  12/21/2021   IR ANGIO INTRA EXTRACRAN SEL INTERNAL CAROTID UNI R MOD SED  06/26/2022   IR ANGIO VERTEBRAL SEL VERTEBRAL BILAT MOD SED  12/21/2021   IR ANGIOGRAM FOLLOW UP STUDY  12/21/2021   IR ANGIOGRAM FOLLOW UP STUDY  12/21/2021   IR TRANSCATH/EMBOLIZ  12/21/2021   IR US GUIDE VASC ACCESS RIGHT  06/26/2022   RADIOLOGY WITH  ANESTHESIA N/A 12/21/2021   Procedure: IR WITH ANESTHESIA;  Surgeon: Lisbeth Renshaw, MD;  Location: [facility] OR;  Service: Radiology;  Laterality: N/A;   TONSILLECTOMY AND ADENOIDECTOMY  1998     Social History   Tobacco Use  Smoking Status Former   Packs/day: 0.25   Years: 10.00   Additional pack years: 0.00   Total pack years: 2.50   Types: Cigarettes   Quit date: 12/20/2021   Years since quitting: 0.5   Passive exposure: Past  Smokeless Tobacco Never    Social History   Substance and Sexual Activity  Alcohol Use Yes   Comment: Socially     Family History  Problem Relation Age of Onset   Diabetes Mother    Cerebral aneurysm Father    CVA Father    Kidney disease Brother       Current Outpatient Medications:    acetaminophen (TYLENOL) 325 MG tablet, Take 2 tablets (650 mg total) by mouth every 6 (six) hours as needed for moderate pain., Disp: 30 tablet, Rfl: 0   busPIRone (BUSPAR) 5 MG tablet, Take 1 tablet (5 mg total) by mouth 3 (three) times daily. (Patient taking differently: Take 5 mg by mouth 2 (two) times daily.), Disp: 90 tablet, Rfl: 2   cholecalciferol (VITAMIN D3) 25 MCG (1000 UNIT) tablet, Take 1 tablet (1,000 Units total) by mouth daily., Disp: 90 tablet, Rfl: 1   diphenhydrAMINE (BENADRYL) 25 MG  tablet, Take 25 mg by mouth every 6 (six) hours as needed for sleep (anxiety)., Disp: , Rfl:    famotidine (PEPCID) 20 MG tablet, Take 1 tablet (20 mg total) by mouth 2 (two) times daily., Disp: 30 tablet, Rfl: 0   ferrous sulfate 324 (65 Fe) MG TBEC, Take 1 tablet (325 mg total) by mouth every other day., Disp: 90 tablet, Rfl: 1   Fructose-Dextrose-Phosphor Acd (NAUSEA CONTROL PO), Take 1 tablet by mouth daily as needed (nausea and vomiting)., Disp: , Rfl:    polyethylene glycol (MIRALAX) 17 g packet, Take 17 g by mouth daily as needed., Disp: 30 each, Rfl: 1   Cardiovascular and other pertinent studies:  Reviewed external labs and tests, independently  interpreted  EKG 07/23/2022: Sinus tachycardia 105 bpm  Nonspecific T-abnormality   Recent labs: 07/08/2022: Glucose 96, BUN/Cr 6/0.6. EGFR NA. K 4.2. Hb 10.7. TSH 0.8 normal   Review of Systems  Cardiovascular:  Positive for chest pain, dyspnea on exertion and palpitations. Negative for leg swelling and syncope.         Vitals:   07/23/22 1327  BP: 119/81  Pulse: (!) 114  SpO2: 98%     Body mass index is 38.73 kg/m. Filed Weights   07/23/22 1327  Weight: 205 lb (93 kg)     Objective:   Physical Exam Vitals and nursing note reviewed.  Constitutional:      General: She is not in acute distress. Neck:     Vascular: No JVD.  Cardiovascular:     Rate and Rhythm: Normal rate and regular rhythm.     Heart sounds: Normal heart sounds. No murmur heard. Pulmonary:     Effort: Pulmonary effort is normal.     Breath sounds: Normal breath sounds. No wheezing or rales.  Musculoskeletal:     Right lower leg: No edema.     Left lower leg: No edema.           Visit diagnoses:   ICD-10-CM   1. Precordial pain  R07.2 EKG 12-Lead    PCV ECHOCARDIOGRAM COMPLETE    PCV CARDIAC STRESS TEST    2. Palpitations  R00.2 LONG TERM MONITOR (3-14 DAYS)       Orders Placed This Encounter  Procedures   EKG 12-Lead      Assessment & Recommendations:    37 y.o. African-American female with hypertension, h/o aneurysmal subarachnoid hemorrhage, anxiety, depression, referred for evaluation of chest pain.  Chest pain: Atypical. I do suspect deconditioning and anxiety contributing.  Recommend exercise treadmill stress test, echocardiogram.  Palpitations: Resting tachycardia. Anemia and anxiety likely contributing.  Check cardiac telemetry to evaluate for any other arrhythmia.   Further recommendations after above testing    Thank you for referring the patient to [place]. Please feel free to contact with any questions.   Elder Negus, MD Pager:  [phone] Office: [phone]

## 2022-07-24 ENCOUNTER — Encounter: Payer: Self-pay | Admitting: Cardiology

## 2022-07-24 DIAGNOSIS — R072 Precordial pain: Secondary | ICD-10-CM | POA: Insufficient documentation

## 2022-07-26 ENCOUNTER — Ambulatory Visit: Payer: Medicaid Other

## 2022-07-26 ENCOUNTER — Other Ambulatory Visit: Payer: Self-pay

## 2022-07-26 VITALS — BP 119/80 | HR 92 | Temp 98.1°F | Resp 28 | Ht 61.0 in | Wt 212.7 lb

## 2022-07-26 DIAGNOSIS — I608 Other nontraumatic subarachnoid hemorrhage: Secondary | ICD-10-CM | POA: Diagnosis not present

## 2022-07-26 DIAGNOSIS — N644 Mastodynia: Secondary | ICD-10-CM | POA: Insufficient documentation

## 2022-07-26 DIAGNOSIS — E876 Hypokalemia: Secondary | ICD-10-CM

## 2022-07-26 DIAGNOSIS — K921 Melena: Secondary | ICD-10-CM | POA: Diagnosis not present

## 2022-07-26 DIAGNOSIS — F419 Anxiety disorder, unspecified: Secondary | ICD-10-CM | POA: Diagnosis not present

## 2022-07-26 MED ORDER — HYDROXYZINE HCL 10 MG PO TABS: 10.0000 mg | ORAL_TABLET | Freq: Three times a day (TID) | ORAL | 0 refills | Status: DC | PRN

## 2022-07-26 MED ORDER — SERTRALINE HCL 50 MG PO TABS: 50.0000 mg | ORAL_TABLET | Freq: Every day | ORAL | 2 refills | Status: DC

## 2022-07-26 NOTE — Patient Instructions (Signed)
[name], it was a pleasure seeing you today!  Today we discussed: Will get labs to check potassium. Referral sent for breast imaging. Try zoloft 50 mg daily for two weeks, increase if only partial benefit and tolerating well. Also can take hydroxazine up to 3 times daily for breakthrough symptoms.  I have ordered the following labs today:   Lab Orders         BMP8+Anion Gap      Tests ordered today:  Mammogram, ultrasound  Referrals ordered today:   Referral Orders  No referral(s) requested today     I have ordered the following medication/changed the following medications:   Stop the following medications: Medications Discontinued During This Encounter  Medication Reason   diphenhydrAMINE (BENADRYL) 25 MG tablet Discontinued by provider     Start the following medications: Meds ordered this encounter  Medications   sertraline (ZOLOFT) 50 MG tablet    Sig: Take 1 tablet (50 mg total) by mouth daily.    Dispense:  30 tablet    Refill:  2   hydrOXYzine (ATARAX) 10 MG tablet    Sig: Take 1 tablet (10 mg total) by mouth 3 (three) times daily as needed.    Dispense:  30 tablet    Refill:  0     Follow-up:  1 month virtual    Please make sure to arrive 15 minutes prior to your next appointment. If you arrive late, you may be asked to reschedule.   We look forward to seeing you next time. Please call our clinic at [phone] if you have any questions or concerns. The best time to call is Monday-Friday from 9am-4pm, but there is someone available 24/7. If after hours or the weekend, call the main hospital number and ask for the Internal Medicine Resident On-Call. If you need medication refills, please notify your pharmacy one week in advance and they will send [place] a request.  Thank you for letting us take part in your care. Wishing you the best!  Thank you, Adron Bene, MD

## 2022-07-26 NOTE — Assessment & Plan Note (Signed)
Recent angiogram by neurosurgery reassuring. Blood pressure well controlled. -continue monitoring for signs and symptoms -referral has been placed for geneticist

## 2022-07-26 NOTE — Assessment & Plan Note (Signed)
Patient is scheduled for colonoscopy.

## 2022-07-26 NOTE — Progress Notes (Signed)
CC: Follow-up hypokalemia  HPI:  [name] is a 37 y.o. with past medical history as below who presents for follow-up of her hypokalemia.  Please see detailed assessment plan for HPI.  Past Medical History:  Diagnosis Date   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HCC)    Anxiety    Asthma    Depression    Review of Systems: Please see detailed assessment plan for pertinent ROS.  Physical Exam:  Vitals:   07/26/22 1051  BP: 119/80  Pulse: 92  Resp: (!) 28  Temp: 98.1 F (36.7 C)  TempSrc: Oral  SpO2: 100%  Weight: 212 lb 11.2 oz (96.5 kg)  Height: 5\' 1"  (1.549 m)   Physical Exam Exam conducted with a chaperone present.  Constitutional:      General: She is not in acute distress. HENT:     Head: Normocephalic and atraumatic.  Eyes:     Extraocular Movements: Extraocular movements intact.  Cardiovascular:     Rate and Rhythm: Normal rate and regular rhythm.     Heart sounds: No murmur heard. Pulmonary:     Effort: Pulmonary effort is normal.     Breath sounds: No wheezing, rhonchi or rales.  Chest:     Comments: Left chest examined with chaperone. Unable to identify and abnormality on palpation of lateral breast or axilla. No skin changes or nipple discharge. Abdominal:     General: There is no distension.     Palpations: Abdomen is soft.     Tenderness: There is no abdominal tenderness.  Musculoskeletal:     Cervical back: Neck supple.     Right lower leg: No edema.     Left lower leg: No edema.  Skin:    General: Skin is warm and dry.  Neurological:     Mental Status: She is alert and oriented to person, place, and time.  Psychiatric:        Mood and Affect: Mood normal.        Behavior: Behavior normal.      Assessment & Plan:   See Encounters Tab for problem based charting.  Anxiety Patient presents for follow-up of her anxiety.  She reports that BuSpar and Lexapro did benefit her anxiety symptoms, she reports that she was "clearheaded  and thinking logically."  Unfortunately however she states that she feels like she was shaking constantly and t she attributes this to her medication.  We discussed that this is unlikely and could have been related to her electrolyte imbalance.  She has increased her counseling frequency to twice a month. -Trial sertraline, discussed increasing from 50 mg to 100 mg after 2 weeks if well-tolerated -Hydroxyzine 10 mg up to 3 times daily as needed -Continue counseling -Virtual visit in 1 month to follow-up symptoms  Hypokalemia Patient denies symptoms of hypokalemia including muscle cramps, twitches.  She has been taking supplementation as prescribed.  Her potassium was checked at a subsequent ED visit and was found to be normal.  Will repeat again today and if normal will discontinue her supplementation. -Repeat BMP  Bloody stools Patient is scheduled for colonoscopy.  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HCC) Recent angiogram by neurosurgery reassuring. Blood pressure well controlled. -continue monitoring for signs and symptoms -referral has been placed for geneticist  Breast pain, left Patient presents with several months of left lateral breast pain.  She reports that this comes and goes and may be correlated with her menstruation.  She also feels as if her breast get engorged during this  time.  She denies any discharge at the nipple or bleeding.  Denies any skin changes.  Exam today with no identifiable abnormality.  Unclear if her symptoms actually involve breast tissue.  Suspicious for fibrocystic changes given temporality. -Referral for diagnostic mammogram and ultrasound of left breast  Patient discussed with Dr.  Lafonda Mosses

## 2022-07-26 NOTE — Assessment & Plan Note (Signed)
Patient presents with several months of left lateral breast pain.  She reports that this comes and goes and may be correlated with her menstruation.  She also feels as if her breast get engorged during this time.  She denies any discharge at the nipple or bleeding.  Denies any skin changes.  Exam today with no identifiable abnormality.  Unclear if her symptoms actually involve breast tissue.  Suspicious for fibrocystic changes given temporality. -Referral for diagnostic mammogram and ultrasound of left breast

## 2022-07-26 NOTE — Assessment & Plan Note (Signed)
Patient presents for follow-up of her anxiety.  She reports that BuSpar and Lexapro did benefit her anxiety symptoms, she reports that she was "clearheaded and thinking logically."  Unfortunately however she states that she feels like she was shaking constantly and t she attributes this to her medication.  We discussed that this is unlikely and could have been related to her electrolyte imbalance.  She has increased her counseling frequency to twice a month. -Trial sertraline, discussed increasing from 50 mg to 100 mg after 2 weeks if well-tolerated -Hydroxyzine 10 mg up to 3 times daily as needed -Continue counseling -Virtual visit in 1 month to follow-up symptoms

## 2022-07-26 NOTE — Assessment & Plan Note (Signed)
Patient denies symptoms of hypokalemia including muscle cramps, twitches.  She has been taking supplementation as prescribed.  Her potassium was checked at a subsequent ED visit and was found to be normal.  Will repeat again today and if normal will discontinue her supplementation. -Repeat BMP

## 2022-07-27 LAB — BMP8+ANION GAP
Anion Gap: 11 mmol/L (ref 10.0–18.0)
BUN/Creatinine Ratio: 13 (ref 9–23)
BUN: 8 mg/dL (ref 6–20)
CO2: 21 mmol/L (ref 20–29)
Calcium: 9.1 mg/dL (ref 8.7–10.2)
Chloride: 106 mmol/L (ref 96–106)
Creatinine, Ser: 0.64 mg/dL (ref 0.57–1.00)
Glucose: 78 mg/dL (ref 70–99)
Potassium: 4.8 mmol/L (ref 3.5–5.2)
Sodium: 138 mmol/L (ref 134–144)
eGFR: 116 mL/min/{1.73_m2} (ref >59)

## 2022-07-31 DIAGNOSIS — K625 Hemorrhage of anus and rectum: Secondary | ICD-10-CM | POA: Diagnosis not present

## 2022-07-31 DIAGNOSIS — K648 Other hemorrhoids: Secondary | ICD-10-CM | POA: Diagnosis not present

## 2022-07-31 DIAGNOSIS — D124 Benign neoplasm of descending colon: Secondary | ICD-10-CM | POA: Diagnosis not present

## 2022-07-31 DIAGNOSIS — D128 Benign neoplasm of rectum: Secondary | ICD-10-CM | POA: Diagnosis not present

## 2022-07-31 DIAGNOSIS — D125 Benign neoplasm of sigmoid colon: Secondary | ICD-10-CM | POA: Diagnosis not present

## 2022-07-31 DIAGNOSIS — D123 Benign neoplasm of transverse colon: Secondary | ICD-10-CM | POA: Diagnosis not present

## 2022-08-03 NOTE — Progress Notes (Signed)
Internal Medicine Clinic Attending  Case discussed with Dr. White  at the time of the visit.  We reviewed the resident's history and exam and pertinent patient test results.  I agree with the assessment, diagnosis, and plan of care documented in the resident's note.  

## 2022-08-06 ENCOUNTER — Ambulatory Visit: Payer: BC Managed Care – PPO

## 2022-08-06 DIAGNOSIS — R072 Precordial pain: Secondary | ICD-10-CM | POA: Diagnosis not present

## 2022-08-08 ENCOUNTER — Ambulatory Visit (INDEPENDENT_AMBULATORY_CARE_PROVIDER_SITE_OTHER): Payer: Self-pay | Admitting: Licensed Clinical Social Worker

## 2022-08-08 DIAGNOSIS — F419 Anxiety disorder, unspecified: Secondary | ICD-10-CM

## 2022-08-08 NOTE — BH Specialist Note (Signed)
Integrated Behavioral Health via Telemedicine Visit  08/08/2022 Kerry Chavez [id number]  Number of Integrated Behavioral Health Clinician visits: Additional Visit  Session Start time: 1330   Session End time: 1430  Total time in minutes: 60   Referring Provider: Champ Chavez Patient/Family location: Home [facility] Provider location: [facility] All persons participating in visit: Patient only Types of Service: Individual psychotherapy and Video visit   I connected with Kerry Chavez  Video Enabled Telemedicine Application  (Video is Caregility application) and verified that I am speaking with the correct person using two identifiers. Discussed confidentiality: Yes    I discussed the limitations of telemedicine and the availability of in person appointments.  Discussed there is a possibility of technology failure and discussed alternative modes of communication if that failure occurs.   I discussed that engaging in this telemedicine visit, they consent to the provision of behavioral healthcare.   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    Patient continues to work on specific techniques to decrease anxiety. [facility]  explained "333 Rule" and deep breathing.    Patient reported journaling. Patient will continue to journal. Patient will also write herself a letter of how proud she is of herself.           Patient may benefit from Motivational Interviewing, Solution-Focused Strategies, and Mindfulness or Management consultant.   Plan: Follow up with behavioral health clinician on :05/28     I discussed the assessment and treatment plan with the patient and/or parent/guardian. They were provided an opportunity to ask questions and all were answered. They agreed with the plan and demonstrated an understanding of the instructions.   They were advised to call back or seek an in-person evaluation if the symptoms worsen or if the condition fails to improve as anticipated.   Kerry Chavez, MSW, LCSW-A She/Her Behavioral Health Clinician [facility]  Internal Medicine Center Direct Dial:[phone]  Fax [phone] Main Office Phone: [phone] [address] [place] [place], [place] [address] Website: [facility] Internal Medicine [facility]  [place], [place]  [place]

## 2022-08-09 ENCOUNTER — Ambulatory Visit (INDEPENDENT_AMBULATORY_CARE_PROVIDER_SITE_OTHER): Payer: Medicaid Other | Admitting: Obstetrics

## 2022-08-09 ENCOUNTER — Encounter: Payer: Self-pay | Admitting: Obstetrics

## 2022-08-09 VITALS — BP 119/85 | HR 86 | Ht 61.0 in | Wt 218.5 lb

## 2022-08-09 DIAGNOSIS — Z01419 Encounter for gynecological examination (general) (routine) without abnormal findings: Secondary | ICD-10-CM | POA: Diagnosis not present

## 2022-08-09 DIAGNOSIS — N63 Unspecified lump in unspecified breast: Secondary | ICD-10-CM | POA: Diagnosis not present

## 2022-08-09 DIAGNOSIS — N946 Dysmenorrhea, unspecified: Secondary | ICD-10-CM

## 2022-08-09 DIAGNOSIS — N939 Abnormal uterine and vaginal bleeding, unspecified: Secondary | ICD-10-CM

## 2022-08-09 DIAGNOSIS — D500 Iron deficiency anemia secondary to blood loss (chronic): Secondary | ICD-10-CM

## 2022-08-09 MED ORDER — ACCRUFER 30 MG PO CAPS
1.0000 | ORAL_CAPSULE | Freq: Two times a day (BID) | ORAL | 3 refills | Status: DC
Start: 2022-08-09 — End: 2022-09-03

## 2022-08-09 MED ORDER — IBUPROFEN 800 MG PO TABS
800.0000 mg | ORAL_TABLET | Freq: Three times a day (TID) | ORAL | 5 refills | Status: DC | PRN
Start: 2022-08-09 — End: 2022-11-08

## 2022-08-09 NOTE — Progress Notes (Signed)
Subjective:        Kerry Chavez is a 37 y.o. female here for a routine exam.  Current complaints: Heavy and painful periods.    Personal health questionnaire:  Is patient Ashkenazi Jewish, have a family history of breast and/or ovarian cancer: no Is there a family history of uterine cancer diagnosed at age < 21, gastrointestinal cancer, urinary tract cancer, family member who is a Personnel officer syndrome-associated carrier: no Is the patient overweight and hypertensive, family history of diabetes, personal history of gestational diabetes, preeclampsia or PCOS: no Is patient over 16, have PCOS,  family history of premature CHD under age 59, diabetes, smoke, have hypertension or peripheral artery disease:  no At any time, has a partner hit, kicked or otherwise hurt or frightened you?: no Over the past 2 weeks, have you felt down, depressed or hopeless?: no Over the past 2 weeks, have you felt little interest or pleasure in doing things?:no   Gynecologic History Patient's last menstrual period was 08/09/2022. Contraception: tubal ligation Last Pap: 2020. Results were: normal Last mammogram: n/a. Results were: n/a  Obstetric History OB History  Gravida Para Term Preterm AB Living  4 2 2     2   SAB IAB Ectopic Multiple Live Births        0 2    # Outcome Date GA Lbr Len/2nd Weight Sex Delivery Anes PTL Lv  4 Term 06/12/14 [redacted]w[redacted]d  6 lb 13.2 oz (3.095 kg) M CS-LTranv Spinal  LIV     Birth Comments: Hgb, Normal, FA Newborn Screen Barcode: [id number] Date Collected: 06/13/2014  3 Term 10/27/03    F CS-LTranv   LIV  2 Gravida           1 Gravida             Past Medical History:  Diagnosis Date   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HCC) 11/2021   Anxiety    Asthma    Depression     Past Surgical History:  Procedure Laterality Date   CESAREAN SECTION     CESAREAN SECTION WITH BILATERAL TUBAL LIGATION Bilateral 06/12/2014   Procedure: Repeat CESAREAN SECTION WITH BILATERAL TUBAL  LIGATION;  Surgeon: Maxie Better, MD;  Location: [facility];  Service: Obstetrics;  Laterality: Bilateral;  EDD: 06/19/14 Wants clear C-Section drape for this case   IR ANGIO INTRA EXTRACRAN SEL INTERNAL CAROTID UNI L MOD SED  12/21/2021   IR ANGIO INTRA EXTRACRAN SEL INTERNAL CAROTID UNI R MOD SED  12/21/2021   IR ANGIO INTRA EXTRACRAN SEL INTERNAL CAROTID UNI R MOD SED  06/26/2022   IR ANGIO VERTEBRAL SEL VERTEBRAL BILAT MOD SED  12/21/2021   IR ANGIOGRAM FOLLOW UP STUDY  12/21/2021   IR ANGIOGRAM FOLLOW UP STUDY  12/21/2021   IR TRANSCATH/EMBOLIZ  12/21/2021   IR US GUIDE VASC ACCESS RIGHT  06/26/2022   RADIOLOGY WITH ANESTHESIA N/A 12/21/2021   Procedure: IR WITH ANESTHESIA;  Surgeon: Lisbeth Renshaw, MD;  Location: [facility] OR;  Service: Radiology;  Laterality: N/A;   TONSILLECTOMY AND ADENOIDECTOMY  1998     Current Outpatient Medications:    acetaminophen (TYLENOL) 325 MG tablet, Take 2 tablets (650 mg total) by mouth every 6 (six) hours as needed for moderate pain., Disp: 30 tablet, Rfl: 0   busPIRone (BUSPAR) 5 MG tablet, Take 1 tablet (5 mg total) by mouth 3 (three) times daily. (Patient taking differently: Take 5 mg by mouth 2 (two) times daily.), Disp: 90 tablet, Rfl: 2  cholecalciferol (VITAMIN D3) 25 MCG (1000 UNIT) tablet, Take 1 tablet (1,000 Units total) by mouth daily., Disp: 90 tablet, Rfl: 1   famotidine (PEPCID) 20 MG tablet, Take 1 tablet (20 mg total) by mouth 2 (two) times daily., Disp: 30 tablet, Rfl: 0   Ferric Maltol (ACCRUFER) 30 MG CAPS, Take 1 capsule (30 mg total) by mouth 2 (two) times daily before a meal. Take 2 hrs before, or 2 hrs after a meal., Disp: 60 capsule, Rfl: 3   ferrous sulfate 324 (65 Fe) MG TBEC, Take 1 tablet (325 mg total) by mouth every other day., Disp: 90 tablet, Rfl: 1   Fructose-Dextrose-Phosphor Acd (NAUSEA CONTROL PO), Take 1 tablet by mouth daily as needed (nausea and vomiting)., Disp: , Rfl:    hydrOXYzine (ATARAX) 10 MG tablet, Take 1 tablet (10  mg total) by mouth 3 (three) times daily as needed., Disp: 30 tablet, Rfl: 0   ibuprofen (ADVIL) 800 MG tablet, Take 1 tablet (800 mg total) by mouth every 8 (eight) hours as needed., Disp: 30 tablet, Rfl: 5   polyethylene glycol (MIRALAX) 17 g packet, Take 17 g by mouth daily as needed., Disp: 30 each, Rfl: 1   sertraline (ZOLOFT) 50 MG tablet, Take 1 tablet (50 mg total) by mouth daily., Disp: 30 tablet, Rfl: 2 Allergies  Allergen Reactions   Shellfish Allergy Swelling   Ivp Dye [Iodinated Contrast Media] Other (See Comments)    Unknown reaction   Misc. Sulfonamide Containing Compounds    Other    Penicillins Hives, Itching and Swelling    REACTION: rash   Sulfamethoxazole Hives and Swelling    REACTION: rash   Latex Itching and Rash    With condoms only    Social History   Tobacco Use   Smoking status: Former    Packs/day: 0.25    Years: 10.00    Additional pack years: 0.00    Total pack years: 2.50    Types: Cigarettes    Quit date: 12/20/2021    Years since quitting: 0.6    Passive exposure: Past   Smokeless tobacco: Never  Substance Use Topics   Alcohol use: Yes    Comment: Socially    Family History  Problem Relation Age of Onset   Diabetes Mother    Cerebral aneurysm Father    CVA Father    Kidney disease Brother    Breast cancer Paternal Aunt       Review of Systems  Constitutional: negative for fatigue and weight loss Respiratory: negative for cough and wheezing Cardiovascular: negative for chest pain, fatigue and palpitations Gastrointestinal: negative for abdominal pain and change in bowel habits Musculoskeletal:negative for myalgias Neurological: negative for gait problems and tremors Behavioral/Psych: negative for abusive relationship, depression Endocrine: negative for temperature intolerance    Genitourinary: positive for abnormal menstrual periods.  Negative for genital lesions, hot flashes, sexual problems and vaginal  discharge Integument/breast: positive for breast lump and breast tenderness.  Negative for nipple discharge and skin lesion(s)    Objective:       BP 119/85   Pulse 86   Ht 5\' 1"  (1.549 m)   Wt 218 lb 8 oz (99.1 kg)   LMP 08/09/2022   BMI 41.29 kg/m  General:   Alert and no distress  Skin:   no rash or abnormalities  Lungs:   clear to auscultation bilaterally  Heart:   regular rate and rhythm, S1, S2 normal, no murmur, click, rub or gallop  Breasts:  Dense and glanular.  No discrete masses  Abdomen:  normal findings: no organomegaly, soft, non-tender and no hernia  Pelvic exam::  deferred due to heavy period   Lab Review Urine pregnancy test Labs reviewed yes Radiologic studies reviewed yes  I have spent a total of 20 minutes of face-to-face time, excluding clinical staff time, reviewing notes and preparing to see patient, ordering tests and/or medications, and counseling the patient.   Assessment:    1. Encounter for gynecological examination  2. Abnormal uterine bleeding (AUB) - Endometrial Ablation recommended - educational brochure dispensed  3. Severe dysmenorrhea Rx: - ibuprofen (ADVIL) 800 MG tablet; Take 1 tablet (800 mg total) by mouth every 8 (eight) hours as needed.  Dispense: 30 tablet; Refill: 5  4. Iron deficiency anemia due to chronic blood loss Rx: - Ferric Maltol (ACCRUFER) 30 MG CAPS; Take 1 capsule (30 mg total) by mouth 2 (two) times daily before a meal. Take 2 hrs before, or 2 hrs after a meal.  Dispense: 60 capsule; Refill: 3  5. Breast mass in female - diagnostic breast tomogram ordered   Plan:    Education reviewed: calcium supplements, depression evaluation, low fat, low cholesterol diet, safe sex/STD prevention, and self breast exams. Contraception: tubal ligation. Mammogram ordered. Follow up in: 2 weeks.   Meds ordered this encounter  Medications   Ferric Maltol (ACCRUFER) 30 MG CAPS    Sig: Take 1 capsule (30 mg total) by mouth  2 (two) times daily before a meal. Take 2 hrs before, or 2 hrs after a meal.    Dispense:  60 capsule    Refill:  3   ibuprofen (ADVIL) 800 MG tablet    Sig: Take 1 tablet (800 mg total) by mouth every 8 (eight) hours as needed.    Dispense:  30 tablet    Refill:  5    Brock Bad, MD 08/09/2022 1:30 PM

## 2022-08-09 NOTE — Progress Notes (Signed)
New Patient is in the office to establish care LMP 08/09/22 Reports heavy bleeding with menstrual cycles but have become shorter going from 6 days to 4 Last pap 04/25/2018  Pt would like to defer pap due to being on cycle today

## 2022-08-13 ENCOUNTER — Ambulatory Visit: Payer: BC Managed Care – PPO | Admitting: Obstetrics and Gynecology

## 2022-08-13 ENCOUNTER — Ambulatory Visit: Payer: Medicaid Other | Admitting: Neurology

## 2022-08-13 ENCOUNTER — Encounter: Payer: Self-pay | Admitting: Neurology

## 2022-08-13 VITALS — BP 117/77 | HR 87 | Ht 61.0 in | Wt 214.2 lb

## 2022-08-13 DIAGNOSIS — Z8679 Personal history of other diseases of the circulatory system: Secondary | ICD-10-CM | POA: Diagnosis not present

## 2022-08-13 DIAGNOSIS — Z9189 Other specified personal risk factors, not elsewhere classified: Secondary | ICD-10-CM

## 2022-08-13 DIAGNOSIS — R0683 Snoring: Secondary | ICD-10-CM

## 2022-08-13 NOTE — Patient Instructions (Signed)

## 2022-08-13 NOTE — Progress Notes (Signed)
Subjective:    Patient ID: Kerry Chavez is a 37 y.o. female.  HPI    Huston Foley, MD, PhD [facility] [address] [address] [place], [place] [address]  Dear Drs. Burnice Logan and [place],   I saw your patient, Kerry Chavez, upon your kind request in my sleep clinic today for initial consultation of her sleep disorder, in particular, concern for underlying obstructive sleep apnea.  The patient is unaccompanied today. She missed an appointment on 07/11/2022.  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  Her Epworth sleepiness score is 3 out of 24, fatigue severity score is 39 out of 63.  She goes to bed around 10 and rise time is around 6:30 AM.  It may take her about 2 hours to fall asleep.  She currently does not take any over-the-counter or prescription sleep aids.  She has no nightly nocturia and no recurrent nocturnal or morning headaches.  She does drink caffeine in the form of soda, about 2 cans/day, she no longer smokes, she quit last year.  Upon further asking as there was cigarette smoke on her close she reported that her mom smokes in the house, she lives with her mom and 2 children, ages 92 and 66.  She used to work in Clinical biochemist, she is currently on disability.  She drinks alcohol occasionally.  She had a tonsillectomy and adenoidectomy as a child.  She is status post aneurysm coiling, reports that she recently had a follow-up angiogram.  She has gained quite a bit of weight since last year, in the realm of 40 pounds.  She is not aware of any family history of sleep apnea.   I reviewed your office note from 05/02/2022.  Her Past Medical  History Is Significant For: Past Medical History:  Diagnosis Date   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HCC) 11/2021   Anxiety    Asthma    Depression     Her Past Surgical History Is Significant For: Past Surgical History:  Procedure Laterality Date   CESAREAN SECTION     CESAREAN SECTION WITH BILATERAL TUBAL LIGATION Bilateral 06/12/2014   Procedure: Repeat CESAREAN SECTION WITH BILATERAL TUBAL LIGATION;  Surgeon: Maxie Better, MD;  Location: [facility];  Service: Obstetrics;  Laterality: Bilateral;  EDD: 06/19/14 Wants clear C-Section drape for this case   IR ANGIO INTRA EXTRACRAN SEL INTERNAL CAROTID UNI L MOD SED  12/21/2021   IR ANGIO INTRA EXTRACRAN SEL INTERNAL CAROTID UNI R MOD SED  12/21/2021   IR ANGIO INTRA EXTRACRAN SEL INTERNAL CAROTID UNI R MOD SED  06/26/2022   IR ANGIO VERTEBRAL SEL VERTEBRAL BILAT MOD SED  12/21/2021   IR ANGIOGRAM FOLLOW UP STUDY  12/21/2021   IR ANGIOGRAM FOLLOW UP STUDY  12/21/2021   IR TRANSCATH/EMBOLIZ  12/21/2021   IR US GUIDE VASC ACCESS RIGHT  06/26/2022   RADIOLOGY WITH ANESTHESIA N/A 12/21/2021   Procedure: IR WITH ANESTHESIA;  Surgeon: Lisbeth Renshaw, MD;  Location: [facility] OR;  Service: Radiology;  Laterality: N/A;   TONSILLECTOMY AND ADENOIDECTOMY  1998    Her Family History Is Significant For: Family History  Problem Relation Age of Onset   Diabetes Mother    Cerebral aneurysm Father  CVA Father    Kidney disease Brother    Breast cancer Paternal Aunt     Her Social History Is Significant For: Social History   Socioeconomic History   Marital status: Single    Spouse name: Not on file   Number of children: 2   Years of education: Not on file   Highest education level: Not on file  Occupational History   Occupation: Customer Service  Tobacco Use   Smoking status: Former    Packs/day: 0.25    Years: 10.00    Additional pack years: 0.00    Total pack years: 2.50    Types: Cigarettes    Quit date: 12/20/2021    Years since  quitting: 0.6    Passive exposure: Past   Smokeless tobacco: Never  Vaping Use   Vaping Use: Never used  Substance and Sexual Activity   Alcohol use: Yes    Comment: Socially   Drug use: Not Currently    Types: Marijuana   Sexual activity: Yes  Other Topics Concern   Not on file  Social History Narrative   Caffiene one daily.   Working: not right now   Lives with 2 kids.  (18 yr ,  8 ys).   Social Determinants of Health   Financial Resource Strain: Not on file  Food Insecurity: No Food Insecurity (02/27/2022)   Hunger Vital Sign    Worried About Running Out of Food in the Last Year: Never true    Ran Out of Food in the Last Year: Never true  Transportation Needs: No Transportation Needs (02/27/2022)   PRAPARE - Administrator, Civil Service (Medical): No    Lack of Transportation (Non-Medical): No  Physical Activity: Not on file  Stress: Not on file  Social Connections: Moderately Isolated (02/27/2022)   Social Connection and Isolation Panel [NHANES]    Frequency of Communication with Friends and Family: More than three times a week    Frequency of Social Gatherings with Friends and Family: More than three times a week    Attends Religious Services: More than 4 times per year    Active Member of [employer] or Organizations: No    Attends Banker Meetings: Never    Marital Status: Never married    Her Allergies Are:  Allergies  Allergen Reactions   Shellfish Allergy Swelling   Ivp Dye [Iodinated Contrast Media] Other (See Comments)    Unknown reaction   Misc. Sulfonamide Containing Compounds    Other    Penicillins Hives, Itching and Swelling    REACTION: rash   Sulfamethoxazole Hives and Swelling    REACTION: rash   Latex Itching and Rash    With condoms only  :   Her Current Medications Are:  Outpatient Encounter Medications as of 08/13/2022  Medication Sig   acetaminophen (TYLENOL) 325 MG tablet Take 2 tablets (650 mg total) by mouth every  6 (six) hours as needed for moderate pain.   busPIRone (BUSPAR) 5 MG tablet Take 1 tablet (5 mg total) by mouth 3 (three) times daily. (Patient taking differently: Take 5 mg by mouth 2 (two) times daily.)   cholecalciferol (VITAMIN D3) 25 MCG (1000 UNIT) tablet Take 1 tablet (1,000 Units total) by mouth daily.   famotidine (PEPCID) 20 MG tablet Take 1 tablet (20 mg total) by mouth 2 (two) times daily.   ferrous sulfate 324 (65 Fe) MG TBEC Take 1 tablet (325 mg total) by mouth every other day.  Fructose-Dextrose-Phosphor Acd (NAUSEA CONTROL PO) Take 1 tablet by mouth daily as needed (nausea and vomiting).   hydrOXYzine (ATARAX) 10 MG tablet Take 1 tablet (10 mg total) by mouth 3 (three) times daily as needed.   ibuprofen (ADVIL) 800 MG tablet Take 1 tablet (800 mg total) by mouth every 8 (eight) hours as needed.   polyethylene glycol (MIRALAX) 17 g packet Take 17 g by mouth daily as needed.   sertraline (ZOLOFT) 50 MG tablet Take 1 tablet (50 mg total) by mouth daily.   Ferric Maltol (ACCRUFER) 30 MG CAPS Take 1 capsule (30 mg total) by mouth 2 (two) times daily before a meal. Take 2 hrs before, or 2 hrs after a meal. (Patient not taking: Reported on 08/13/2022)   [DISCONTINUED] citalopram (CELEXA) 20 MG tablet Take 1 tablet (20 mg total) by mouth daily.   No facility-administered encounter medications on file as of 08/13/2022.  :   Review of Systems:  Out of a complete 14 point review of systems, all are reviewed and negative with the exception of these symptoms as listed below:   Review of Systems  Neurological:        Internal Referral / fatigue (she is anemic),  had stoke/brain aneurysm. Risk of OSA / HOFFMAN, ERIK C - No prior SS.  ESS 3   FSS   37.    Objective:  Neurological Exam  Physical Exam Physical Examination:   Vitals:   08/13/22 1116  BP: 117/77  Pulse: 87    General Examination: The patient is a very pleasant 37 y.o. female in no acute distress. She appears  well-developed and well-nourished and well groomed.   HEENT: Normocephalic, atraumatic, pupils are equal, round and reactive to light, extraocular tracking is good without limitation to gaze excursion or nystagmus noted. Hearing is grossly intact. Face is symmetric with normal facial animation. Speech is clear with no dysarthria noted. There is no hypophonia. There is no lip, neck/head, jaw or voice tremor. Neck is supple with full range of passive and active motion. There are no carotid bruits on auscultation. Oropharynx exam reveals: mild mouth dryness, adequate dental hygiene and moderate airway crowding, due to small airway opening, elongated tongue, slightly thicker soft palate, tonsils absent, Mallampati class II.  Neck circumference 15-1/4 inches.  She has a mild overbite.  Tongue protrudes centrally and palate elevates symmetrically.  Chest: Clear to auscultation without wheezing, rhonchi or crackles noted.  Heart: S1+S2+0, regular and normal without murmurs, rubs or gallops noted.   Abdomen: Soft, non-tender and non-distended.  Extremities: There is no obvious edema in the distal lower extremities bilaterally.   Skin: Warm and dry without trophic changes noted.   Musculoskeletal: exam reveals no obvious joint deformities.   Neurologically:  Mental status: The patient is awake, alert and oriented in all 4 spheres. Her immediate and remote memory, attention, language skills and fund of knowledge are appropriate. There is no evidence of aphasia, agnosia, apraxia or anomia. Speech is clear with normal prosody and enunciation. Thought process is linear. Mood is normal and affect is normal.  Cranial nerves II - XII are as described above under HEENT exam.  Motor exam: Normal bulk, strength and tone is noted. There is no obvious action or resting tremor.  Fine motor skills and coordination: grossly intact.  Cerebellar testing: No dysmetria or intention tremor. There is no truncal or gait  ataxia.  Sensory exam: intact to light touch in the upper and lower extremities.  Gait, station  and balance: She stands easily. No veering to one side is noted. No leaning to one side is noted. Posture is age-appropriate and stance is narrow based. Gait shows normal stride length and normal pace. No problems turning are noted.   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 A laboratory attended sleep study is typically considered "gold standard" for evaluation of sleep disordered breathing.   I had a long chat with the patient about my findings and the diagnosis of sleep apnea, particularly OSA, its prognosis and treatment options. We talked about medical/conservative treatments, surgical interventions and non-pharmacological approaches for symptom control. I explained, in particular, the risks and ramifications of untreated moderate to severe OSA, especially with respect to developing cardiovascular disease down the road, including congestive heart failure (CHF), difficult to treat hypertension, cardiac arrhythmias (particularly A-fib), neurovascular complications including TIA, stroke and dementia. Even type 2 diabetes has, in part, been linked to untreated OSA. Symptoms of untreated OSA may include (but may not be limited to) daytime sleepiness, nocturia (i.e. frequent nighttime urination), memory problems, mood irritability and suboptimally controlled or worsening mood disorder such as depression and/or anxiety, lack of energy, lack of motivation, physical discomfort, as well as recurrent  headaches, especially morning or nocturnal headaches. We talked about the importance of maintaining a healthy lifestyle and striving for healthy weight.  The importance of ongoing smoking cessation was also addressed.   I recommended a sleep study at this time. I outlined the differences between a laboratory attended sleep study which is considered more comprehensive and accurate over the option of a home sleep test (HST); the latter may lead to underestimation of sleep disordered breathing in some instances and does not help with diagnosing upper airway resistance syndrome and is not accurate enough to diagnose primary central sleep apnea typically. I outlined possible surgical and non-surgical treatment options of OSA, including the use of a positive airway pressure (PAP) device (i.e. CPAP, AutoPAP/APAP or BiPAP in certain circumstances), a custom-made dental device (aka oral appliance, which would require a referral to a specialist dentist or orthodontist typically, and is generally speaking not considered for patients with full dentures or edentulous state), upper airway surgical options, such as traditional UPPP (which is not considered a first-line treatment) or the Inspire device (hypoglossal nerve stimulator, which would involve a referral for consultation with an ENT surgeon, after careful selection, following inclusion criteria - also not first-line treatment). I explained the PAP treatment option to the patient in detail, as this is generally considered first-line treatment.  The patient indicated that she would be willing to try PAP therapy, if the need arises. I explained the importance of being compliant with PAP treatment, not only for insurance purposes but primarily to improve patient's symptoms symptoms, and for the patient's long term health benefit, including to reduce Her cardiovascular risks longer-term.    We will pick up our discussion about the next steps and treatment options after  testing.  We will keep her posted as to the test results by phone call and/or MyChart messaging where possible.  We will plan to follow-up in sleep clinic accordingly as well.  I  answered all her questions today and the patient was in agreement.   I encouraged her to call with any interim questions, concerns, problems or updates or email [place] through MyChart.  Generally speaking, sleep test authorizations may take up to 2 weeks, sometimes less, sometimes longer, the patient is encouraged to get in touch with [place] if they do not hear back from the sleep lab staff directly within the next 2 weeks.  Thank you very much for allowing me to participate in the care of this nice patient. If I can be of any further assistance to you please do not hesitate to call me at [phone].  Sincerely,   Huston Foley, MD, PhD

## 2022-08-16 NOTE — BH Specialist Note (Signed)
Integrated Behavioral Health via Telemedicine Visit  06/13/2022 Tanetta Sutton [id number]  Number of Integrated Behavioral Health Clinician visits: Additional Visit  Session Start time: 1430  Session End time: 1500  Total time in minutes: 60   Referring Provider: Champ Mungo Patient/Family location: Home [facility] Provider location: [facility] All persons participating in visit: Patient only Types of Service: Individual psychotherapy and Video visit   I connected with [name]  Video Enabled Telemedicine Application  (Video is Caregility application) and verified that I am speaking with the correct person using two identifiers. Discussed confidentiality: Yes    I discussed the limitations of telemedicine and the availability of in person appointments.  Discussed there is a possibility of technology failure and discussed alternative modes of communication if that failure occurs.   I discussed that engaging in this telemedicine visit, they consent to the provision of behavioral healthcare.   Patient and/or legal guardian expressed understanding and consented to Telemedicine visit: Yes    Goals Addressed: Patient will:  Reduce symptoms of: anxiety      Progress towards Goals: Ongoing   Interventions: Interventions utilized:  Motivational Interviewing, Solution-Focused Strategies, and Mindfulness or Relaxation Training Standardized Assessments completed: PHQ-SADS       05/30/2022    1:33 PM 02/27/2022   12:07 PM 01/02/2022    2:27 PM  PHQ-SADS Last 3 Score only  Total GAD-7 Score 14 19 15         Assessment: [facility] introduced self to patient on today and gave patient [facility] direct contact information. Patient experiencing depression and anxiety related to her health.    Patient will work on specific techniques to decrease anxiety. [facility] explained "333 Rule" and deep breathing.    Patient will journal.    Patient and [facility] agreed to come up with goals for future  visits. Patient  will write down goals and we will discuss on next visit.        Patient may benefit from Motivational Interviewing, Solution-Focused Strategies, and Mindfulness or Management consultant.   Plan: Follow up with behavioral health clinician on : Within the next 30 days     I discussed the assessment and treatment plan with the patient and/or parent/guardian. They were provided an opportunity to ask questions and all were answered. They agreed with the plan and demonstrated an understanding of the instructions.   They were advised to call back or seek an in-person evaluation if the symptoms worsen or if the condition fails to improve as anticipated.   Christen Butter, MSW, LCSW-A She/Her Behavioral Health Clinician [facility]  Internal Medicine Center Direct Dial:[phone]  Fax [phone] Main Office Phone: [phone] [address] [place] [place], [place] [address] Website: [facility] Internal Medicine [facility]  [place], [place]  [place]

## 2022-08-21 DIAGNOSIS — R002 Palpitations: Secondary | ICD-10-CM | POA: Diagnosis not present

## 2022-08-21 NOTE — Progress Notes (Signed)
Patient referred by Champ Mungo, DO for chest pain  Subjective:   Kerry Chavez, female    DOB: December 02, 1985, 37 y.o.   MRN: [id number]   Chief Complaint  Patient presents with   Chest Pain   Follow-up   Results     HPI  37 y.o. African-American female with hypertension, h/o aneurysmal subarachnoid hemorrhage, anxiety, depression, referred for evaluation of chest pain.  Patient has not had any recurrent chest pain, but has had palpitations. Reviewed recent test results with the patient, details below.    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  She has made excellent neurological recovery, but has had significant anxiety depression since then.  She has episodes of chest pain that occur for few minutes, both at rest, but sometimes with exertion.  She is also noticed her heart rate goes very fast with minimal activity.  She has been struggling with anxiety and depression, sees a therapist and is also on medications for the same.  She does not smoke.    Current Outpatient Medications:    acetaminophen (TYLENOL) 325 MG tablet, Take 2 tablets (650 mg total) by mouth every 6 (six) hours as needed for moderate pain., Disp: 30 tablet, Rfl: 0   busPIRone (BUSPAR) 5 MG tablet, Take 1 tablet (5 mg total) by mouth 3 (three) times daily. (Patient taking differently: Take 5 mg by mouth 2 (two) times daily.), Disp: 90 tablet, Rfl: 2   cholecalciferol (VITAMIN D3) 25 MCG (1000 UNIT) tablet, Take 1 tablet (1,000 Units total) by mouth daily., Disp: 90 tablet, Rfl: 1   famotidine (PEPCID) 20 MG tablet, Take 1 tablet (20 mg total) by mouth 2 (two) times daily., Disp: 30 tablet, Rfl: 0   Ferric Maltol (ACCRUFER) 30 MG CAPS, Take 1 capsule (30 mg total) by mouth 2 (two) times daily before a meal. Take 2 hrs before, or 2 hrs after a meal. (Patient  not taking: Reported on 08/13/2022), Disp: 60 capsule, Rfl: 3   ferrous sulfate 324 (65 Fe) MG TBEC, Take 1 tablet (325 mg total) by mouth every other day., Disp: 90 tablet, Rfl: 1   Fructose-Dextrose-Phosphor Acd (NAUSEA CONTROL PO), Take 1 tablet by mouth daily as needed (nausea and vomiting)., Disp: , Rfl:    hydrOXYzine (ATARAX) 10 MG tablet, Take 1 tablet (10 mg total) by mouth 3 (three) times daily as needed., Disp: 30 tablet, Rfl: 0   ibuprofen (ADVIL) 800 MG tablet, Take 1 tablet (800 mg total) by mouth every 8 (eight) hours as needed., Disp: 30 tablet, Rfl: 5   polyethylene glycol (MIRALAX) 17 g packet, Take 17 g by mouth daily as needed., Disp: 30 each, Rfl: 1   sertraline (ZOLOFT) 50 MG tablet, Take 1 tablet (50 mg total) by mouth daily., Disp: 30 tablet, Rfl: 2   Cardiovascular and other pertinent studies:  Reviewed external labs and tests, independently interpreted  EKG 07/23/2022: Sinus tachycardia 105 bpm  Nonspecific T-abnormality  Echocardiogram 08/28/2022: Normal LV systolic function with visual EF 55-60%. Left ventricle cavity is normal in size. Normal left ventricular wall thickness. Normal global wall motion. Normal diastolic filling pattern, normal LAP. 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 Mild (Grade I) mitral regurgitation. Mild tricuspid regurgitation. No evidence of pulmonary hypertension. No prior study for comparison.  Exercise treadmill stress test 08/08/2022: Exercise  treadmill stress test performed using Bruce protocol.  Patient exercised for 5 minutes and 1 seconds, reached 7 METS, and 77%% of age predicted maximum heart rate.  Exercise capacity was low.  No chest pain reported.  Heart rate response limited due to low exercise capacity. Normal blood pressure response. Stress EKG at 77% MPHR revealed no ischemic changes. Inconclusive exercise treadmill stress test due to inability to reach 85% MPHR.  Mobile  cardiac telemetry 13 days 07/23/2022 - 08/06/2022: Dominant rhythm: Sinus. HR 56-146 bpm. Avg HR 98 bpm, in sinus rhythm. 0 episodes of SVT. <1% isolated SVE, couplet, no triplets. 4 episodes of VT, fastest and longest at 194 bpm for 11 beats. <1% isolated VE, couplet, no triplets. No atrial fibrillation/atrial flutter/SVT/high grade AV block, sinus pause >3sec noted. 0 patient triggered events.   Recent labs: 07/08/2022: Glucose 96, BUN/Cr 6/0.6. EGFR NA. K 4.2. Hb 10.7. TSH 0.8 normal   Review of Systems  Cardiovascular:  Positive for chest pain, dyspnea on exertion and palpitations. Negative for leg swelling and syncope.         Vitals:   09/03/22 0947  BP: 132/84  Pulse: (!) 101  Resp: 16  SpO2: 97%     Body mass index is 39.87 kg/m. Filed Weights   09/03/22 0947  Weight: 211 lb (95.7 kg)     Objective:   Physical Exam Vitals and nursing note reviewed.  Constitutional:      General: She is not in acute distress. Neck:     Vascular: No JVD.  Cardiovascular:     Rate and Rhythm: Normal rate and regular rhythm.     Heart sounds: Normal heart sounds. No murmur heard. Pulmonary:     Effort: Pulmonary effort is normal.     Breath sounds: Normal breath sounds. No wheezing or rales.  Musculoskeletal:     Right lower leg: No edema.     Left lower leg: No edema.           Visit diagnoses:   ICD-10-CM   1. NSVT (nonsustained ventricular tachycardia) (HCC)  I47.29 PCV MYOCARDIAL PERFUSION WITH LEXISCAN    2. Precordial pain  R07.2 PCV MYOCARDIAL PERFUSION WITH LEXISCAN       Orders Placed This Encounter  Procedures   PCV MYOCARDIAL PERFUSION WITH LEXISCAN      Assessment & Recommendations:    37 y.o. African-American female with hypertension, h/o aneurysmal subarachnoid hemorrhage, anxiety, depression, referred for evaluation of chest pain.  Chest pain: Submaximal stress test with 77% MPHR reached, no ischemic changes at this heart  rate. Structurally normal heart. Will obtain Lexiscan nuclear stress test to evaluate for ischemia.  Palpitations: 4 episodes of asymptomatic NSVT up to 11 beats. Recommend metoprolol tartrate 25 mg bid.  F/u in 3 months   Elder Negus, MD Pager: [phone] Office: [phone]

## 2022-08-22 ENCOUNTER — Ambulatory Visit (INDEPENDENT_AMBULATORY_CARE_PROVIDER_SITE_OTHER): Payer: BC Managed Care – PPO | Admitting: Licensed Clinical Social Worker

## 2022-08-22 DIAGNOSIS — F419 Anxiety disorder, unspecified: Secondary | ICD-10-CM

## 2022-08-22 NOTE — BH Specialist Note (Signed)
[facility] received email from patient on today requesting to reschedule appointment due to a doctors appointment. Patient will contact officer at her convenience to reschedule appointment with [facility] within the next 30 days.   Christen Butter, MSW, LCSW-A She/Her Behavioral Health Clinician [facility]  Internal Medicine Center Direct Dial:[phone]  Fax [phone] Main Office Phone: [phone] [address] [place] [place], [place] [address] Website: [facility] Internal Medicine [facility]  [place], [place]  [place]

## 2022-08-26 ENCOUNTER — Other Ambulatory Visit: Payer: Self-pay | Admitting: Internal Medicine

## 2022-08-26 NOTE — Progress Notes (Deleted)
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 Past C-section, tonsillectomy and adenoidectomy.  She presents to our office today for further evaluation regarding rectal bleeding.  She was seen by cardiologist Dr. Rosemary Holms due to having chest pain and palpitations.  He suspected her symptoms were due to to deconditioning and anxiety.  An exercise treadmill stress test and echo were ordered.     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   Anxiety    Asthma    Depression    Past Surgical History:  Procedure Laterality Date   CESAREAN SECTION     CESAREAN SECTION WITH BILATERAL TUBAL LIGATION Bilateral 06/12/2014   Procedure: Repeat CESAREAN SECTION WITH BILATERAL TUBAL LIGATION;  Surgeon: Maxie Better, MD;  Location: [facility];  Service: Obstetrics;  Laterality: Bilateral;  EDD: 06/19/14 Wants clear C-Section drape for this case   IR ANGIO INTRA EXTRACRAN SEL INTERNAL CAROTID UNI L MOD SED  12/21/2021   IR ANGIO INTRA EXTRACRAN SEL INTERNAL CAROTID UNI R MOD SED  12/21/2021   IR ANGIO INTRA EXTRACRAN SEL INTERNAL CAROTID UNI R MOD SED  06/26/2022   IR ANGIO VERTEBRAL SEL VERTEBRAL BILAT MOD SED  12/21/2021   IR ANGIOGRAM FOLLOW UP STUDY  12/21/2021   IR ANGIOGRAM FOLLOW UP STUDY  12/21/2021   IR TRANSCATH/EMBOLIZ  12/21/2021   IR US GUIDE VASC ACCESS RIGHT  06/26/2022   RADIOLOGY WITH ANESTHESIA N/A 12/21/2021   Procedure:  IR WITH ANESTHESIA;  Surgeon: Lisbeth Renshaw, MD;  Location: [facility] OR;  Service: Radiology;  Laterality: N/A;   TONSILLECTOMY AND ADENOIDECTOMY  1998   Social History:  Reports that she quit smoking about 8 months ago. Her smoking use included cigarettes. She has a 2.50 pack-year smoking history. She has been exposed to tobacco smoke. She has never used smokeless tobacco. She reports current alcohol use. She reports that she does not currently use drugs after having used the following drugs: Marijuana.  Family History: family history includes Breast cancer in her paternal aunt; CVA in her father; Cerebral aneurysm in her father; Diabetes in her mother; Kidney disease in her brother.  Allergies  Allergen Reactions   Shellfish Allergy Swelling   Ivp Dye [Iodinated Contrast Media] Other (See Comments)    Unknown reaction   Misc. Sulfonamide Containing Compounds    Other    Penicillins Hives, Itching and Swelling    REACTION: rash   Sulfamethoxazole Hives and Swelling    REACTION: rash   Latex Itching and Rash    With condoms only      Outpatient Encounter Medications as of 08/27/2022  Medication Sig   acetaminophen (TYLENOL) 325 MG tablet Take 2 tablets (650 mg total) by mouth every 6 (six) hours as needed for moderate pain.   busPIRone (BUSPAR)  5 MG tablet Take 1 tablet (5 mg total) by mouth 3 (three) times daily. (Patient taking differently: Take 5 mg by mouth 2 (two) times daily.)   cholecalciferol (VITAMIN D3) 25 MCG (1000 UNIT) tablet Take 1 tablet (1,000 Units total) by mouth daily.   famotidine (PEPCID) 20 MG tablet Take 1 tablet (20 mg total) by mouth 2 (two) times daily.   Ferric Maltol (ACCRUFER) 30 MG CAPS Take 1 capsule (30 mg total) by mouth 2 (two) times daily before a meal. Take 2 hrs before, or 2 hrs after a meal. (Patient not taking: Reported on 08/13/2022)   ferrous sulfate 324 (65 Fe) MG TBEC Take 1 tablet (325 mg total) by mouth every other day.    Fructose-Dextrose-Phosphor Acd (NAUSEA CONTROL PO) Take 1 tablet by mouth daily as needed (nausea and vomiting).   hydrOXYzine (ATARAX) 10 MG tablet Take 1 tablet (10 mg total) by mouth 3 (three) times daily as needed.   ibuprofen (ADVIL) 800 MG tablet Take 1 tablet (800 mg total) by mouth every 8 (eight) hours as needed.   polyethylene glycol (MIRALAX) 17 g packet Take 17 g by mouth daily as needed.   sertraline (ZOLOFT) 50 MG tablet Take 1 tablet (50 mg total) by mouth daily.   [DISCONTINUED] citalopram (CELEXA) 20 MG tablet Take 1 tablet (20 mg total) by mouth daily.   No facility-administered encounter medications on file as of 08/27/2022.     REVIEW OF SYSTEMS:  Gen: Denies fever, sweats or chills. No weight loss.  CV: Denies chest pain, palpitations or edema. Resp: Denies cough, shortness of breath of hemoptysis.  GI: Denies heartburn, dysphagia, stomach or lower abdominal pain. No diarrhea or constipation.  GU : Denies urinary burning, blood in urine, increased urinary frequency or incontinence. MS: Denies joint pain, muscles aches or weakness. Derm: Denies rash, itchiness, skin lesions or unhealing ulcers. Psych: Denies depression, anxiety, memory loss or confusion. Heme: Denies bruising, easy bleeding. Neuro:  Denies headaches, dizziness or paresthesias. Endo:  Denies any problems with DM, thyroid or adrenal function.  PHYSICAL EXAM: LMP 08/09/2022  General: in no acute distress. Head: Normocephalic and atraumatic. Eyes:  Sclerae non-icteric, conjunctive pink. Ears: Normal auditory acuity. Mouth: Dentition intact. No ulcers or lesions.  Neck: Supple, no lymphadenopathy or thyromegaly.  Lungs: Clear bilaterally to auscultation without wheezes, crackles or rhonchi. Heart: Regular rate and rhythm. No murmur, rub or gallop appreciated.  Abdomen: Soft, nontender, nondistended. No masses. No hepatosplenomegaly. Normoactive bowel sounds x 4 quadrants.  Rectal:  Musculoskeletal:  Symmetrical with no gross deformities. Skin: Warm and dry. No rash or lesions on visible extremities. Extremities: No edema. Neurological: Alert oriented x 4, no focal deficits.  Psychological:  Alert and cooperative. Normal mood and affect.  ASSESSMENT AND PLAN:    CC:  Champ Mungo, DO

## 2022-08-27 ENCOUNTER — Ambulatory Visit: Payer: BC Managed Care – PPO | Admitting: Nurse Practitioner

## 2022-08-28 ENCOUNTER — Telehealth: Payer: Self-pay | Admitting: Neurology

## 2022-08-28 ENCOUNTER — Ambulatory Visit: Payer: BC Managed Care – PPO

## 2022-08-28 DIAGNOSIS — R072 Precordial pain: Secondary | ICD-10-CM | POA: Diagnosis not present

## 2022-08-28 NOTE — Telephone Encounter (Signed)
MCD Healthy blue pending uploaded notes on the portal  

## 2022-08-30 ENCOUNTER — Other Ambulatory Visit: Payer: Self-pay | Admitting: *Deleted

## 2022-08-31 ENCOUNTER — Telehealth: Payer: Self-pay | Admitting: *Deleted

## 2022-08-31 NOTE — Telephone Encounter (Signed)
Great plan. We can certainly help her with her grief next week but I am glad that she has follow-up scheduled with the appropriate specialists next week as well.

## 2022-08-31 NOTE — Telephone Encounter (Signed)
Patient called in c/o 1 week hx of hands, arms, legs, feet tingling when she is still for 2-3 minutes. Sx are relieved when pumps hands into fists and marches in place. Denies dropping items, denies H/A, CP, SHOB, one-sided weakness, balance issues, or slurring words. States she feels increased anxiety as her cousin died last week. Next appt with [facility] is 6/13. States this feels nothing like when she had brain aneurysm 12/21/21. Has appt with [facility] on 6/10 at 0945. Given appt with [facility] same day at 1045. Patient understands to call 911 if she develops severe h/a, CP, SHOB, one sided weakness, balance issues, slurring words.

## 2022-09-03 ENCOUNTER — Ambulatory Visit: Payer: Medicaid Other | Admitting: Cardiology

## 2022-09-03 ENCOUNTER — Ambulatory Visit: Payer: BC Managed Care – PPO | Admitting: Student

## 2022-09-03 ENCOUNTER — Encounter: Payer: Self-pay | Admitting: Cardiology

## 2022-09-03 ENCOUNTER — Other Ambulatory Visit: Payer: Self-pay

## 2022-09-03 ENCOUNTER — Other Ambulatory Visit: Payer: Medicaid Other | Admitting: Obstetrics and Gynecology

## 2022-09-03 VITALS — BP 132/84 | HR 101 | Resp 16 | Ht 61.0 in | Wt 211.0 lb

## 2022-09-03 VITALS — BP 108/74 | HR 94 | Temp 98.0°F | Resp 32 | Ht 61.0 in | Wt 212.5 lb

## 2022-09-03 DIAGNOSIS — G2581 Restless legs syndrome: Secondary | ICD-10-CM

## 2022-09-03 DIAGNOSIS — R072 Precordial pain: Secondary | ICD-10-CM | POA: Diagnosis not present

## 2022-09-03 DIAGNOSIS — I4729 Other ventricular tachycardia: Secondary | ICD-10-CM | POA: Insufficient documentation

## 2022-09-03 DIAGNOSIS — D500 Iron deficiency anemia secondary to blood loss (chronic): Secondary | ICD-10-CM | POA: Diagnosis not present

## 2022-09-03 HISTORY — DX: Other ventricular tachycardia: I47.29

## 2022-09-03 MED ORDER — METOPROLOL TARTRATE 25 MG PO TABS: 25.0000 mg | ORAL_TABLET | Freq: Two times a day (BID) | ORAL | 3 refills | Status: DC

## 2022-09-03 MED ORDER — HYDROXYZINE HCL 10 MG PO TABS: 10.0000 mg | ORAL_TABLET | Freq: Three times a day (TID) | ORAL | 3 refills | Status: DC | PRN

## 2022-09-03 NOTE — Patient Instructions (Signed)
Thank you so much for coming to the clinic today!   I think you may have something called Restless leg Syndrome. We are rechecking your iron studies today, keep taking the iron. I will call you with the results. Please remember to stay hydrated.   If you have any questions please feel free to the call the clinic at anytime at [phone]. It was a pleasure seeing you!  Best, Dr. Thomasene Ripple

## 2022-09-04 DIAGNOSIS — G2581 Restless legs syndrome: Secondary | ICD-10-CM | POA: Insufficient documentation

## 2022-09-04 LAB — CBC
Hematocrit: 34.5 % (ref 34.0–46.6)
Hemoglobin: 10.5 g/dL — ABNORMAL LOW (ref 11.1–15.9)
MCH: 26.3 pg — ABNORMAL LOW (ref 26.6–33.0)
MCHC: 30.4 g/dL — ABNORMAL LOW (ref 31.5–35.7)
MCV: 87 fL (ref 79–97)
Platelets: 344 10*3/uL (ref 150–450)
RBC: 3.99 x10E6/uL (ref 3.77–5.28)
RDW: 17 % — ABNORMAL HIGH (ref 11.7–15.4)
WBC: 9.3 10*3/uL (ref 3.4–10.8)

## 2022-09-04 LAB — IRON,TIBC AND FERRITIN PANEL
Ferritin: 11 ng/mL — ABNORMAL LOW (ref 15–150)
Iron Saturation: 4 % — CL (ref 15–55)
Iron: 19 ug/dL — ABNORMAL LOW (ref 27–159)
Total Iron Binding Capacity: 480 ug/dL — ABNORMAL HIGH (ref 250–450)
UIBC: 461 ug/dL — ABNORMAL HIGH (ref 131–425)

## 2022-09-04 NOTE — Progress Notes (Signed)
CC: Leg spasms  HPI:  [name] is a 37 y.o. female living with a history stated below and presents today for leg spasms. Please see problem based assessment and plan for additional details.  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 30 tablet 0   cholecalciferol (VITAMIN D3) 25 MCG (1000 UNIT) tablet Take 1 tablet (1,000 Units total) by mouth daily. 90 tablet 1   famotidine (PEPCID) 20 MG tablet Take 1 tablet (20 mg total) by mouth 2 (two) times daily. 30 tablet 0   Ferric Maltol (ACCRUFER) 30 MG CAPS Take 1 capsule by mouth daily.     Fructose-Dextrose-Phosphor Acd (NAUSEA CONTROL PO) Take 1 tablet by mouth daily as needed (nausea and vomiting).     ibuprofen (ADVIL) 800 MG tablet Take 1 tablet (800 mg total) by mouth every 8 (eight) hours as needed. 30 tablet 5   metoprolol tartrate (LOPRESSOR) 25 MG tablet Take 1 tablet (25 mg total) by mouth 2 (two) times daily. 180 tablet 3   polyethylene glycol (MIRALAX) 17 g packet Take 17 g by mouth daily as needed. 30 each 1   [DISCONTINUED] citalopram (CELEXA) 20 MG tablet Take 1 tablet (20 mg total) by mouth daily. 30 tablet 2   No current facility-administered medications on file prior to visit.    Family History  Problem Relation Age of Onset   Diabetes Mother    Cerebral aneurysm Father    CVA Father    Kidney disease Brother    Breast cancer Paternal Aunt     Social History   Socioeconomic History   Marital status: Single    Spouse name: Not on file   Number of children: 2   Years of education: Not on file   Highest education level: Not on file  Occupational History   Occupation: Customer Service  Tobacco Use    Smoking status: Former    Packs/day: 0.25    Years: 10.00    Additional pack years: 0.00    Total pack years: 2.50    Types: Cigarettes    Quit date: 12/20/2021    Years since quitting: 0.7    Passive exposure: Past   Smokeless tobacco: Never  Vaping Use   Vaping Use: Never used  Substance and Sexual Activity   Alcohol use: Yes    Comment: Socially   Drug use: Not Currently    Types: Marijuana   Sexual activity: Yes  Other Topics Concern   Not on file  Social History Narrative   Caffiene one daily.   Working: not right now   Lives with 2 kids.  (18 yr ,  8 ys).   Social Determinants of Health   Financial Resource Strain: Not on file  Food Insecurity: No Food Insecurity (02/27/2022)   Hunger Vital Sign    Worried About Running Out of Food in the Last Year: Never true    Ran Out of Food in the Last Year: Never true  Transportation Needs: No Transportation Needs (02/27/2022)   PRAPARE - Administrator, Civil Service (Medical): No    Lack of Transportation (Non-Medical): No  Physical Activity: Not on  file  Stress: Not on file  Social Connections: Moderately Isolated (02/27/2022)   Social Connection and Isolation Panel [NHANES]    Frequency of Communication with Friends and Family: More than three times a week    Frequency of Social Gatherings with Friends and Family: More than three times a week    Attends Religious Services: More than 4 times per year    Active Member of [employer] or Organizations: No    Attends Banker Meetings: Never    Marital Status: Never married  Intimate Partner Violence: Not At Risk (02/27/2022)   Humiliation, Afraid, Rape, and Kick questionnaire    Fear of Current or Ex-Partner: No    Emotionally Abused: No    Physically Abused: No    Sexually Abused: No    Review of Systems: ROS negative except for what is noted on the assessment and plan.  Vitals:   09/03/22 1101  BP: 108/74  Pulse: 94  Resp: (!) 32  Temp: 98 F  (36.7 C)  TempSrc: Oral  SpO2: 100%  Weight: 212 lb 8 oz (96.4 kg)  Height: 5\' 1"  (1.549 m)    Physical Exam: Constitutional: well-appearing female  in no acute distress Cardiovascular: regular rate and rhythm, no m/r/g Pulmonary/Chest: normal work of breathing on room air, lungs clear to auscultation bilaterally MSK: normal bulk and tone, non-tender extremities, no erythema present on thigh region bilaterally Neurological: alert & oriented x 3, 5/5 strength in bilateral upper and lower extremities, Psych: Anxious mood and affect  Assessment & Plan:   Restless leg syndrome Pt states that for the last week and a half she has notied that her legs seem to "tighten up". It worsens when she is laying in bed, or sitting still. It gets better when she stands up. It can last anywhere between 5 minutes and an hour. She does state that she has an urge to move her legs every now and then, especially when laying down or sitting. Symptoms could be multifactorial, as she endorses being dehydrated, has a significant history of anxiety, and is iron deficient.   Iron panel returned consistent with iron deficiency anemia (Ferritin 11, Iron Sat 4, Iron 19). Iron deficiency anemia could be causing her restless leg syndrome, and will replete and re-evaluate once done if symptoms persist. Iron deficit currently 800mg .   Plan:  - Increase Ferric Maltol to Twice a day  - Iron infusion 250mg  order placed  Patient discussed with Dr. Rockey Situ, M.D. [facility] Internal Medicine, PGY-1 Pager: [phone] Date 09/04/2022 Time 9:53 AM

## 2022-09-04 NOTE — Assessment & Plan Note (Signed)
Pt states that for the last week and a half she has notied that her legs seem to "tighten up". It worsens when she is laying in bed, or sitting still. It gets better when she stands up. It can last anywhere between 5 minutes and an hour. She does state that she has an urge to move her legs every now and then, especially when laying down or sitting. Symptoms could be multifactorial, as she endorses being dehydrated, has a significant history of anxiety, and is iron deficient.   Iron panel returned consistent with iron deficiency anemia (Ferritin 11, Iron Sat 4, Iron 19). Iron deficiency anemia could be causing her restless leg syndrome, and will replete and re-evaluate once done if symptoms persist. Iron deficit currently 800mg .   Plan:  - Increase Ferric Maltol to Twice a day  - Iron infusion 250mg  order placed

## 2022-09-05 ENCOUNTER — Ambulatory Visit
Admission: RE | Admit: 2022-09-05 | Discharge: 2022-09-05 | Disposition: A | Discharge status: Home / Self Care | Payer: Medicaid Other | Source: Ambulatory Visit | Attending: Internal Medicine | Admitting: Internal Medicine

## 2022-09-05 DIAGNOSIS — N644 Mastodynia: Secondary | ICD-10-CM | POA: Diagnosis not present

## 2022-09-06 ENCOUNTER — Ambulatory Visit (INDEPENDENT_AMBULATORY_CARE_PROVIDER_SITE_OTHER): Payer: Medicaid Other | Admitting: Licensed Clinical Social Worker

## 2022-09-06 DIAGNOSIS — F411 Generalized anxiety disorder: Secondary | ICD-10-CM

## 2022-09-06 NOTE — BH Specialist Note (Signed)
Integrated Behavioral Health via Telemedicine Visit  09/06/2022 Mccayla Shimada [id number]  Number of Integrated Behavioral Health Clinician visits: Additional Visit  Session Start time: 10:15Am  Session End time: 10:45 Am Total time in minutes: 30  Referring Provider: Champ Mungo Patient/Family location: Home [facility] Provider location: [facility] All persons participating in visit: Patient only Types of Service: Individual psychotherapy and Video visit   I connected with [name]  Video Enabled Telemedicine Application  (Video is Caregility application) and verified that I am speaking with the correct person using two identifiers. Discussed confidentiality: Yes    I discussed the limitations of telemedicine and the availability of in person appointments.  Discussed there is a possibility of technology failure and discussed alternative modes of communication if that failure occurs.   I discussed that engaging in this telemedicine visit, they consent to the provision of behavioral healthcare.   Patient and/or legal guardian expressed understanding and consented to Telemedicine visit: Yes    Goals Addressed: Patient will:  Reduce symptoms of: anxiety      Progress towards Goals: Ongoing   Interventions: Interventions utilized:  Motivational Interviewing, Solution-Focused Strategies, and Mindfulness or Relaxation Training Standardized Assessments completed: PHQ-SADS       05/30/2022    1:33 PM 02/27/2022   12:07 PM 01/02/2022    2:27 PM  PHQ-SADS Last 3 Score only  Total GAD-7 Score 14 19 15           Assessment:   Patient has experience a lot of deaths in family since last visit with [facility] discussed grief counseling.  Patient and family will be traveling to [place]. [facility] and patient discussed anxiety associated with traveling. Patient will focus on the enjoyment of traveling with family. Patient will make multiple stops to stretch and enjoy sights.   Patient relieved from  positive news of prior mammogram screening.   Patient experiencing possible low iron and will undergo an iron diffusion.       Patient may benefit from Motivational Interviewing, Solution-Focused Strategies, and Mindfulness or Management consultant.   Plan: Follow up with behavioral health clinician on : within the next 30 days.      I discussed the assessment and treatment plan with the patient and/or parent/guardian. They were provided an opportunity to ask questions and all were answered. They agreed with the plan and demonstrated an understanding of the instructions.   They were advised to call back or seek an in-person evaluation if the symptoms worsen or if the condition fails to improve as anticipated.   Christen Butter, MSW, LCSW-A She/Her Behavioral Health Clinician [facility]  Internal Medicine Center Direct [phone]  Fax [phone] Main Office Phone: [phone] [address] [place] [place], [place] [address] Website: [facility] Internal Medicine [facility]  [place], [place]  [place]

## 2022-09-07 ENCOUNTER — Ambulatory Visit (HOSPITAL_COMMUNITY)
Admission: RE | Admit: 2022-09-07 | Discharge: 2022-09-07 | Disposition: A | Discharge status: Home / Self Care | Payer: BC Managed Care – PPO | Source: Ambulatory Visit | Attending: Internal Medicine | Admitting: Internal Medicine

## 2022-09-07 DIAGNOSIS — D500 Iron deficiency anemia secondary to blood loss (chronic): Secondary | ICD-10-CM | POA: Diagnosis not present

## 2022-09-07 MED ORDER — SODIUM CHLORIDE 0.9 % IV SOLN
250.0000 mg | Freq: Once | INTRAVENOUS | Status: AC
  Administered 2022-09-07: 250 mg via INTRAVENOUS
  Filled 2022-09-07: qty 250

## 2022-09-10 NOTE — Telephone Encounter (Signed)
Checked status on the portal it is still pending.  

## 2022-09-11 ENCOUNTER — Other Ambulatory Visit: Payer: Medicaid Other | Admitting: Obstetrics and Gynecology

## 2022-09-12 NOTE — Telephone Encounter (Signed)
Checked status on the portal it is still pending.  

## 2022-09-13 ENCOUNTER — Encounter (HOSPITAL_COMMUNITY): Payer: Medicaid Other

## 2022-09-20 NOTE — Progress Notes (Signed)
Internal Medicine Clinic Attending  Case discussed with Dr. Nooruddin  At the time of the visit.  We reviewed the resident's history and exam and pertinent patient test results.  I agree with the assessment, diagnosis, and plan of care documented in the resident's note.  

## 2022-09-24 NOTE — Telephone Encounter (Signed)
[employer]- MCD Healthy blue no auth req via fax form   Patient is scheduled at [facility] for 12/10/22 at 9 pm.  Mailed packet to the patient.

## 2022-10-01 ENCOUNTER — Ambulatory Visit: Payer: BC Managed Care – PPO

## 2022-10-01 DIAGNOSIS — R072 Precordial pain: Secondary | ICD-10-CM

## 2022-10-01 DIAGNOSIS — I4729 Other ventricular tachycardia: Secondary | ICD-10-CM | POA: Diagnosis not present

## 2022-10-08 ENCOUNTER — Emergency Department (HOSPITAL_BASED_OUTPATIENT_CLINIC_OR_DEPARTMENT_OTHER): Payer: BC Managed Care – PPO

## 2022-10-08 ENCOUNTER — Emergency Department (HOSPITAL_BASED_OUTPATIENT_CLINIC_OR_DEPARTMENT_OTHER)
Admission: EM | Admit: 2022-10-08 | Discharge: 2022-10-08 | Disposition: A | Discharge status: Home / Self Care | Payer: BC Managed Care – PPO

## 2022-10-08 ENCOUNTER — Other Ambulatory Visit: Payer: Self-pay

## 2022-10-08 DIAGNOSIS — Z91040 Latex allergy status: Secondary | ICD-10-CM | POA: Insufficient documentation

## 2022-10-08 DIAGNOSIS — R519 Headache, unspecified: Secondary | ICD-10-CM | POA: Insufficient documentation

## 2022-10-08 LAB — CBC
HCT: 37.4 % (ref 36.0–46.0)
Hemoglobin: 12.1 g/dL (ref 12.0–15.0)
MCH: 27.8 pg (ref 26.0–34.0)
MCHC: 32.4 g/dL (ref 30.0–36.0)
MCV: 85.8 fL (ref 80.0–100.0)
Platelets: 326 10*3/uL (ref 150–400)
RBC: 4.36 MIL/uL (ref 3.87–5.11)
RDW: 20.7 % — ABNORMAL HIGH (ref 11.5–15.5)
WBC: 10.1 10*3/uL (ref 4.0–10.5)
nRBC: 0 % (ref 0.0–0.2)

## 2022-10-08 LAB — URINALYSIS, MICROSCOPIC (REFLEX)

## 2022-10-08 LAB — URINALYSIS, ROUTINE W REFLEX MICROSCOPIC
Bilirubin Urine: NEGATIVE
Glucose, UA: NEGATIVE mg/dL
Ketones, ur: NEGATIVE mg/dL
Leukocytes,Ua: NEGATIVE
Nitrite: NEGATIVE
Protein, ur: NEGATIVE mg/dL
Specific Gravity, Urine: 1.01 (ref 1.005–1.030)
pH: 7 (ref 5.0–8.0)

## 2022-10-08 LAB — COMPREHENSIVE METABOLIC PANEL
ALT: 17 U/L (ref 0–44)
AST: 15 U/L (ref 15–41)
Albumin: 4.4 g/dL (ref 3.5–5.0)
Alkaline Phosphatase: 64 U/L (ref 38–126)
Anion gap: 9 (ref 5–15)
BUN: 6 mg/dL (ref 6–20)
CO2: 23 mmol/L (ref 22–32)
Calcium: 9.5 mg/dL (ref 8.9–10.3)
Chloride: 105 mmol/L (ref 98–111)
Creatinine, Ser: 0.69 mg/dL (ref 0.44–1.00)
GFR, Estimated: >60 mL/min (ref >60)
Glucose, Bld: 122 mg/dL — ABNORMAL HIGH (ref 70–99)
Potassium: 4 mmol/L (ref 3.5–5.1)
Sodium: 137 mmol/L (ref 135–145)
Total Bilirubin: 0.4 mg/dL (ref 0.3–1.2)
Total Protein: 7.5 g/dL (ref 6.5–8.1)

## 2022-10-08 LAB — PREGNANCY, URINE: Preg Test, Ur: NEGATIVE

## 2022-10-08 LAB — LIPASE, BLOOD: Lipase: 35 U/L (ref 11–51)

## 2022-10-08 MED ORDER — SODIUM CHLORIDE 0.9 % IV BOLUS
1000.0000 mL | Freq: Once | INTRAVENOUS | Status: AC
  Administered 2022-10-08: 1000 mL via INTRAVENOUS

## 2022-10-08 MED ORDER — DIPHENHYDRAMINE HCL 50 MG/ML IJ SOLN
25.0000 mg | Freq: Once | INTRAMUSCULAR | Status: AC
  Administered 2022-10-08: 25 mg via INTRAVENOUS
  Filled 2022-10-08: qty 1

## 2022-10-08 MED ORDER — PROCHLORPERAZINE EDISYLATE 10 MG/2ML IJ SOLN
10.0000 mg | Freq: Once | INTRAMUSCULAR | Status: AC
  Administered 2022-10-08: 10 mg via INTRAVENOUS
  Filled 2022-10-08: qty 2

## 2022-10-08 NOTE — Discharge Instructions (Signed)
Please follow up with your primary doctor. Return if fevers, chills, chest pain, worsening headache, unilateral weakness, difficulty finding words. Or any new or worsening symptoms that are concerning to you.

## 2022-10-08 NOTE — ED Provider Notes (Signed)
[facility] EMERGENCY DEPARTMENT AT [facility] Provider Note   CSN: [id number] Arrival date & time: 10/08/22  1142     History  Chief Complaint  Patient presents with   Headache    Kerry Chavez is a 37 y.o. female.  Is a 37 year old female presenting emergency department for headache.  Gradual onset for the days ago.  Seems to wax and wane and is sensitive, but has always been present.  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  She denies any vision changes, trauma, fevers.  She does note that she has had some intermittent tingling sensation in her bilateral hands and feet over the past several months.  None currently.  She otherwise has no other neurologic signs or symptoms.  Triage note also notes some mild epigastric abdominal discomfort, this is also been intermittent and evaluated outpatient with recent colonoscopy.  Patient reports that she has had some intermittent blood in stool for the past year as well.  She noted some blood in the toilet this morning.    Headache Associated symptoms: no fever, no seizures and no weakness        Home Medications Prior to Admission medications   Medication Sig Start Date End Date Taking? Authorizing Provider  acetaminophen (TYLENOL) 325 MG tablet Take 2 tablets (650 mg total) by mouth every 6 (six) hours as needed for moderate pain. 07/08/22   Wallis Bamberg, PA-C  cholecalciferol (VITAMIN D3) 25 MCG (1000 UNIT) tablet Take 1 tablet (1,000 Units total) by mouth daily. 07/03/22   Steffanie Rainwater, MD  famotidine (PEPCID) 20 MG tablet Take 1 tablet (20 mg total) by mouth 2 (two) times daily. 05/26/22   Jeanelle Malling, PA  Ferric Maltol (ACCRUFER) 30 MG CAPS Take 1 capsule by mouth daily.    [provider]  Fructose-Dextrose-Phosphor Acd (NAUSEA CONTROL PO) Take 1 tablet by mouth daily as needed (nausea and vomiting).    [provider]  hydrOXYzine (ATARAX) 10  MG tablet Take 1 tablet (10 mg total) by mouth 3 (three) times daily as needed. 09/03/22   Nooruddin, Jason Fila, MD  ibuprofen (ADVIL) 800 MG tablet Take 1 tablet (800 mg total) by mouth every 8 (eight) hours as needed. 08/09/22   Brock Bad, MD  metoprolol tartrate (LOPRESSOR) 25 MG tablet Take 1 tablet (25 mg total) by mouth 2 (two) times daily. 09/03/22 12/02/22  Patwardhan, Anabel Bene, MD  polyethylene glycol (MIRALAX) 17 g packet Take 17 g by mouth daily as needed. 07/03/22   Steffanie Rainwater, MD  citalopram (CELEXA) 20 MG tablet Take 1 tablet (20 mg total) by mouth daily. 01/25/20 06/18/20  Sandre Kitty, MD      Allergies    Shellfish allergy, Ivp dye [iodinated contrast media], Misc. sulfonamide containing compounds, Other, Penicillins, Sulfamethoxazole, and Latex    Review of Systems   Review of Systems  Constitutional:  Negative for fever.  Neurological:  Positive for headaches. Negative for seizures, speech difficulty and weakness.    Physical Exam Updated Vital Signs BP 111/82   Pulse 97   Temp 98.8 F (37.1 C)   Resp 18   Ht 5\' 1"  (1.549 m)   Wt 96.2 kg   SpO2 99%   BMI 40.06 kg/m  Physical Exam Vitals and nursing note reviewed.  Constitutional:      General: She is not in acute distress.    Appearance: She is not  toxic-appearing.  HENT:     Head: Normocephalic.  Eyes:     General: No visual field deficit. Cardiovascular:     Rate and Rhythm: Normal rate and regular rhythm.  Pulmonary:     Effort: Pulmonary effort is normal.  Musculoskeletal:        General: Normal range of motion.     Cervical back: Normal range of motion. No rigidity.  Skin:    Capillary Refill: Capillary refill takes less than 2 seconds.  Neurological:     Mental Status: She is alert and oriented to person, place, and time.     GCS: GCS eye subscore is 4. GCS verbal subscore is 5. GCS motor subscore is 6.     Cranial Nerves: No cranial nerve deficit, dysarthria or facial asymmetry.      Sensory: No sensory deficit.     Motor: No weakness.     Coordination: Coordination normal.  Psychiatric:        Mood and Affect: Mood normal.        Behavior: Behavior normal.     ED Results / Procedures / Treatments   Labs (all labs ordered are listed, but only abnormal results are displayed) Labs Reviewed  COMPREHENSIVE METABOLIC PANEL - Abnormal; Notable for the following components:      Result Value   Glucose, Bld 122 (*)    All other components within normal limits  CBC - Abnormal; Notable for the following components:   RDW 20.7 (*)    All other components within normal limits  URINALYSIS, ROUTINE W REFLEX MICROSCOPIC - Abnormal; Notable for the following components:   Hgb urine dipstick MODERATE (*)    All other components within normal limits  URINALYSIS, MICROSCOPIC (REFLEX) - Abnormal; Notable for the following components:   Bacteria, UA RARE (*)    All other components within normal limits  LIPASE, BLOOD  PREGNANCY, URINE    EKG None  Radiology CT Head Wo Contrast  Result Date: 10/08/2022 CLINICAL DATA:  Headache, worsening frequency and severity. EXAM: CT HEAD WITHOUT CONTRAST TECHNIQUE: Contiguous axial images were obtained from the base of the skull through the vertex without intravenous contrast. RADIATION DOSE REDUCTION: This exam was performed according to the departmental dose-optimization program which includes automated exposure control, adjustment of the mA and/or kV according to patient size and/or use of iterative reconstruction technique. COMPARISON:  12/20/2021.  MRI and CT. FINDINGS: Brain: No acute finding such as acute stroke or subarachnoid hemorrhage. Streak artifact emanating from aneurysm coil embolization in the right carotid siphon region. No evidence of focal stroke, mass, hydrocephalus or extra-axial collection Vascular: No other vascular finding. Skull: Negative Sinuses/Orbits: Clear/normal Other: None IMPRESSION: No acute finding by CT.  Previous aneurysm coil embolization in the right carotid siphon region. Electronically Signed   By: Paulina Fusi M.D.   On: 10/08/2022 14:52    Procedures Procedures    Medications Ordered in ED Medications  sodium chloride 0.9 % bolus 1,000 mL ( Intravenous Stopped 10/08/22 1426)  prochlorperazine (COMPAZINE) injection 10 mg (10 mg Intravenous Given 10/08/22 1315)  diphenhydrAMINE (BENADRYL) injection 25 mg (25 mg Intravenous Given 10/08/22 1314)    ED Course/ Medical Decision Making/ A&P Clinical Course as of 10/08/22 1514  Mon Oct 08, 2022  1300 MRI 12/20/21 results: "IMPRESSION: 1. Minimally enhancing material in the interpeduncular and ambient cisterns, with T2 hyperintense material that does not suppress on FLAIR in these locations, as well as along the cerebellopontine angles and in the fourth  ventricle. Some of this material likely represents hemorrhage, given associated susceptibility, with an additional small amount of hemorrhage in the left occipital horn and third ventricle. These findings are overall concerning for subarachnoid hemorrhage of indeterminate etiology, but these findings could also be seen with meningitis, neurosarcoidosis, or metastatic disease, although these are felt to be less likely. 2. 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 Additional smaller aneurysms in the proximal supraclinoid ICA bilaterally, measuring up to 4 mm on the left and 2 mm on the right. 3. No intracranial large vessel occlusion or significant stenosis. 4. No hemodynamically significant stenosis in the neck.   These results were called by telephone at the time of interpretation on 12/21/2021 at 1:18 am to provider [facility] , who verbally acknowledged these results."  [TY]  1428 Patient reevaluated.  Reports headache nearly resolved.  Continues to have no localizing neurodeficits.  Patient updated on lab values and results.  CT head independently  interpreted by myself without acute bleed or acute intracranial pathology.  Waiting for official radiology read.  Discussed outpatient follow-up with PCP and return precautions.  Patient comfortable with discharge pending final CT read.  [TY]  1514 CT Head Wo Contrast IMPRESSION: No acute finding by CT. Previous aneurysm coil embolization in the right carotid siphon region.   [TY]    Clinical Course User Index [TY] Coral Spikes, DO                             Medical Decision Making Is a 37 year old female with complex past medical history to include subarachnoid hemorrhage with intracranial aneurysms presenting emergency department with headache x 4 days.  She is afebrile, hemodynamically stable.  Physical exam without localizing neurodeficits.  She also has some more chronic complaints of some intermittent abdominal pain, blood in stool, and intermittent tingling sensation in her hands and her feet.  However, her primary concern is her headache today.  Basic labs ordered in triage largely reassuring.  No significant metabolic derangements on her CMP.  Normal renal function.  No transaminitis to suggest papillary disease.  She has no leukocytosis to suggest systemic infection.  Her hemoglobin is 12.1; blood in stool reported to be ongoing and intermittent over the past several months and has had a recent colonoscopy.  Her UA without evidence of UTI.  Given her history of aneurysm and subarachnoid will get CT scan to evaluate for acute intracranial pathology.  Considered CT of the abdomen, however symptoms seemingly intermittent and chronic in nature over the past several months.  Normal labs normal vitals.  Low suspicion for acute intra-abdominal pathology.  Will treat headache supportively with IV fluids, Compazine and Benadryl.  Avoiding Toradol given reported blood in stool. See ED course for further MDM and final disposition.   Amount and/or Complexity of Data Reviewed Labs:  ordered. Radiology: ordered.  Risk Prescription drug management.        Final Clinical Impression(s) / ED Diagnoses Final diagnoses:  Acute nonintractable headache, unspecified headache type    Rx / DC Orders ED Discharge Orders     None         Coral Spikes, DO 10/08/22 1514

## 2022-10-08 NOTE — ED Notes (Signed)
Patient transported to CT 

## 2022-10-08 NOTE — ED Triage Notes (Signed)
Patient presents to ED via POV from home. Here with headache x 4 days. Endorses nausea and vomiting as well. Reports mild abdominal pain. Well appearing. Reports intermittent numbness/tingling to bilateral hands and feet "for months".

## 2022-10-11 ENCOUNTER — Ambulatory Visit (INDEPENDENT_AMBULATORY_CARE_PROVIDER_SITE_OTHER): Payer: BC Managed Care – PPO | Admitting: Licensed Clinical Social Worker

## 2022-10-11 DIAGNOSIS — F419 Anxiety disorder, unspecified: Secondary | ICD-10-CM | POA: Diagnosis not present

## 2022-10-11 DIAGNOSIS — D500 Iron deficiency anemia secondary to blood loss (chronic): Secondary | ICD-10-CM

## 2022-10-11 NOTE — BH Specialist Note (Signed)
Integrated Behavioral Health via Telemedicine Visit  10/11/2022 Tanashia Ciesla [id number]  Number of Integrated Behavioral Health Clinician visits: Additional Visit  Session Start time: 1015   Session End time: 1115  Total time in minutes: 60   Referring Provider: Champ Mungo Patient/Family location: Home [facility] Provider location: [place] All persons participating in visit: Patient only Types of Service: Individual psychotherapy and Video visit   I connected with [name]  Video Enabled Telemedicine Application  (Video is Caregility application) and verified that I am speaking with the correct person using two identifiers. Discussed confidentiality: Yes    I discussed the limitations of telemedicine and the availability of in person appointments.  Discussed there is a possibility of technology failure and discussed alternative modes of communication if that failure occurs.   I discussed that engaging in this telemedicine visit, they consent to the provision of behavioral healthcare.   Patient and/or legal guardian expressed understanding and consented to Telemedicine visit: Yes    Goals Addressed: Patient will:  Reduce symptoms of: anxiety      Progress towards Goals: Ongoing   Interventions: Interventions utilized:  Motivational Interviewing, Solution-Focused Strategies, and Mindfulness or Relaxation Training Standardized Assessments completed: PHQ-SADS       05/30/2022    1:33 PM 02/27/2022   12:07 PM 01/02/2022    2:27 PM  PHQ-SADS Last 3 Score only  Total GAD-7 Score 14 19 15           Assessment:  Patient is experiencing anxiety. Patient is approaching 1 year anniversary of aneurism. [facility] and patient discussed her trauma anniversary and techniques to use to reduce symptoms. [facility] recommended patient to socialize and spend a minimum amount of time alone. [facility] recommended meet up and sent patient an email with support groups for individuals who've had Aneurisms.    Patient agreed to go to [employer], [employer] and [employer] and sons football practice as way to socialize.           Patient may benefit from Motivational Interviewing, Solution-Focused Strategies, and Mindfulness or Management consultant.   Plan: Follow up with behavioral health clinician on : within the next 30 days.      I discussed the assessment and treatment plan with the patient and/or parent/guardian. They were provided an opportunity to ask questions and all were answered. They agreed with the plan and demonstrated an understanding of the instructions.   They were advised to call back or seek an in-person evaluation if the symptoms worsen or if the condition fails to improve as anticipated.   Christen Butter, MSW, LCSW-A She/Her Behavioral Health Clinician [facility]  Internal Medicine Center Direct [phone]  Fax [phone] Main Office Phone: [phone] [address] [place] [place], [place] [address] Website: [facility] Internal Medicine [facility]  [place], [place]  [place]    They were advised to call back or seek an in-person evaluation if the symptoms worsen or if the condition fails to improve as anticipated.  Elmer Sow

## 2022-10-16 ENCOUNTER — Ambulatory Visit (INDEPENDENT_AMBULATORY_CARE_PROVIDER_SITE_OTHER): Payer: BC Managed Care – PPO | Admitting: Student

## 2022-10-16 ENCOUNTER — Other Ambulatory Visit: Payer: Self-pay | Admitting: Internal Medicine

## 2022-10-16 ENCOUNTER — Other Ambulatory Visit: Payer: Self-pay | Admitting: Student

## 2022-10-16 ENCOUNTER — Other Ambulatory Visit: Payer: Self-pay

## 2022-10-16 ENCOUNTER — Encounter: Payer: Self-pay | Admitting: Student

## 2022-10-16 VITALS — BP 120/60 | HR 87 | Temp 97.7°F | Ht 61.0 in | Wt 223.2 lb

## 2022-10-16 DIAGNOSIS — E559 Vitamin D deficiency, unspecified: Secondary | ICD-10-CM

## 2022-10-16 DIAGNOSIS — F322 Major depressive disorder, single episode, severe without psychotic features: Secondary | ICD-10-CM

## 2022-10-16 DIAGNOSIS — E611 Iron deficiency: Secondary | ICD-10-CM

## 2022-10-16 DIAGNOSIS — Z0000 Encounter for general adult medical examination without abnormal findings: Secondary | ICD-10-CM

## 2022-10-16 DIAGNOSIS — D500 Iron deficiency anemia secondary to blood loss (chronic): Secondary | ICD-10-CM | POA: Diagnosis not present

## 2022-10-16 DIAGNOSIS — E876 Hypokalemia: Secondary | ICD-10-CM

## 2022-10-16 MED ORDER — DILTIAZEM HCL ER COATED BEADS 120 MG PO CP24: 120.0000 mg | ORAL_CAPSULE | Freq: Every day | ORAL | 3 refills | Status: DC

## 2022-10-16 MED ORDER — DULOXETINE HCL 30 MG PO CPEP
30.0000 mg | ORAL_CAPSULE | Freq: Every day | ORAL | 2 refills | Status: DC
Start: 2022-10-16 — End: 2022-11-08

## 2022-10-16 NOTE — Assessment & Plan Note (Addendum)
Sadness, difficulty concentrating, poor energy, sleep troubles, overeating with weight gain, anhedonia. Ruminates about her health troubles of late. Symptoms ongoing for months, started a few months after SAH. Was on citalopram but has not had much benefit from this. No history of mania. Discontinue celexa in favor of duloxetine for dual indication of MDD and chronic pain (headache) and tingling in hands and feet. Follow-up in 4 weeks by phone for increase in dose if well-tolerated.     10/16/2022    9:05 AM 07/26/2022   11:45 AM 06/19/2022    3:21 PM  PHQ9 SCORE ONLY  PHQ-9 Total Score 21 12 7

## 2022-10-16 NOTE — Assessment & Plan Note (Addendum)
Not anemic on last CBC. Taking ferric maltol daily. Still with occasional bright red blood with stool and heavy menstrual bleeding. Colonoscopy in May 2024 with several polyps, most hyperplastic and one sessile adenoma. That exam also showed hemorrhoids. Declined rectal exam today but suspect ongoing bleeding from hemorrhoids. Repeat iron studies today and continue iron supplementation.

## 2022-10-16 NOTE — Patient Instructions (Signed)
Remember to bring all of the medications that you take (including over the counter medications and supplements) with you to every clinic visit.  This after visit summary is an important review of tests, referrals, and medication changes that were discussed during your visit. If you have questions or concerns, call [phone]. Outside of clinic business hours, call the main hospital at [phone] and ask the operator for the on-call internal medicine resident.   Marrianne Mood MD 10/16/2022, 9:49 AM

## 2022-10-16 NOTE — Assessment & Plan Note (Signed)
Medication reconciliation completed: yes Medications present with patient: no Barriers to med rec: none Patient reminded to bring medications to all doctor visits.  Pap smear scheduled with another provider at the end of the month.

## 2022-10-16 NOTE — Progress Notes (Addendum)
Subjective:  Kerry Chavez is a 37 y.o. who presents to clinic for the following:  Leg symptoms. Bilateral legs are trembling. Left leg hurts near posterior ankle.  Still feels very anxious after having SAH. Has tingling in hands and feet. Memory loss.   Review of Systems  Constitutional:  Positive for malaise/fatigue. Negative for chills, fever and weight loss.  Cardiovascular:  Positive for palpitations.  Gastrointestinal:  Positive for blood in stool. Negative for vomiting.  Psychiatric/Behavioral:  Positive for depression. Negative for substance abuse and suicidal ideas. The patient is nervous/anxious and has insomnia.    Current Outpatient Medications on File Prior to Visit  Medication Sig Dispense Refill   acetaminophen (TYLENOL) 325 MG tablet Take 2 tablets (650 mg total) by mouth every 6 (six) hours as needed for moderate pain. 30 tablet 0   cholecalciferol (VITAMIN D3) 25 MCG (1000 UNIT) tablet Take 1 tablet (1,000 Units total) by mouth daily. 90 tablet 1   famotidine (PEPCID) 20 MG tablet Take 1 tablet (20 mg total) by mouth 2 (two) times daily. 30 tablet 0   Ferric Maltol (ACCRUFER) 30 MG CAPS Take 1 capsule by mouth daily.     Fructose-Dextrose-Phosphor Acd (NAUSEA CONTROL PO) Take 1 tablet by mouth daily as needed (nausea and vomiting).     hydrOXYzine (ATARAX) 10 MG tablet Take 1 tablet (10 mg total) by mouth 3 (three) times daily as needed. 90 tablet 3   ibuprofen (ADVIL) 800 MG tablet Take 1 tablet (800 mg total) by mouth every 8 (eight) hours as needed. 30 tablet 5   metoprolol tartrate (LOPRESSOR) 25 MG tablet Take 1 tablet (25 mg total) by mouth 2 (two) times daily. 180 tablet 3   polyethylene glycol (MIRALAX) 17 g packet Take 17 g by mouth daily as needed. 30 each 1   [DISCONTINUED] citalopram (CELEXA) 20 MG tablet Take 1 tablet (20 mg total) by mouth daily. 30 tablet 2   No current facility-administered medications on file prior to visit.   Objective:    Vitals:   10/16/22 0859  BP: 120/60  Pulse: 87  Temp: 97.7 F (36.5 C)  TempSrc: Oral  SpO2: 100%  Weight: 223 lb 3.2 oz (101.2 kg)  Height: 5\' 1"  (1.549 m)    Physical Exam Well-appearing, no distress Heart rate and rhythm normal, strong radial pulses, no murmurs, no LE edema Breathing regular and unlabored, no wheezing or crackles Abdomen non-tender Skin warm and dry Alert and oriented, speech normal sounding, face symmetric, gait observed is normal Pleasant but tearful when relating history around [facility]  Assessment & Plan:   Problem List Items Addressed This Visit     Vitamin D deficiency    Taking 1000 units vit D3 daily. Repeat 25 OH vit D level.      Relevant Orders   Vitamin D (25 hydroxy)   Iron deficiency    Not anemic on last CBC. Taking ferric maltol daily. Still with occasional bright red blood with stool and heavy menstrual bleeding. Colonoscopy in May 2024 with several polyps, most hyperplastic and one sessile adenoma. That exam also showed hemorrhoids. Declined rectal exam today but suspect ongoing bleeding from hemorrhoids. Repeat iron studies today and continue iron supplementation.      Healthcare maintenance    Medication reconciliation completed: yes Medications present with patient: no Barriers to med rec: none Patient reminded to bring medications to all doctor visits.  Pap smear scheduled with another provider at the end of the month.  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 Ruminates about her health troubles of late. Symptoms ongoing for months, started a few months after SAH. Was on citalopram but has not had much benefit from this. No history of mania. Discontinue celexa in favor of duloxetine for dual indication of MDD and chronic pain (headache)  and tingling in hands and feet. Follow-up in 4 weeks by phone for increase in dose if well-tolerated.     10/16/2022    9:05 AM 07/26/2022   11:45 AM 06/19/2022    3:21 PM  PHQ9 SCORE ONLY  PHQ-9 Total Score 21 12 7          Relevant Medications   DULoxetine (CYMBALTA) 30 MG capsule    Return in 4 weeks for follow-up of mood after starting Cymbalta.  Patient discussed with Dr. Elige Ko MD 10/16/2022, 2:34 PM  [phone]

## 2022-10-16 NOTE — Assessment & Plan Note (Signed)
Taking 1000 units vit D3 daily. Repeat 25 OH vit D level.

## 2022-10-16 NOTE — Progress Notes (Signed)
Spoke with the patient, you do not need to call. Please move September appt up to August.  Mild stress test abnormality. Adding diltiazem 120 mg daily. Continue metoprolol tartrate 25 mg bid.  Not on Aspirin due to h/o subarachnoid hemorrhage.   Regards, Dr. Rosemary Holms

## 2022-10-17 LAB — IRON,TIBC AND FERRITIN PANEL
Ferritin: 16 ng/mL (ref 15–150)
Iron Saturation: 12 % — ABNORMAL LOW (ref 15–55)
Iron: 49 ug/dL (ref 27–159)
Total Iron Binding Capacity: 395 ug/dL (ref 250–450)
UIBC: 346 ug/dL (ref 131–425)

## 2022-10-17 LAB — VITAMIN D 25 HYDROXY (VIT D DEFICIENCY, FRACTURES): Vit D, 25-Hydroxy: 25.2 ng/mL — ABNORMAL LOW (ref 30.0–100.0)

## 2022-10-24 ENCOUNTER — Other Ambulatory Visit: Payer: BC Managed Care – PPO | Admitting: Obstetrics and Gynecology

## 2022-10-25 NOTE — Progress Notes (Signed)
Internal Medicine Clinic Attending  Case discussed with the resident at the time of the visit.  We reviewed the resident's history and exam and pertinent patient test results.  I agree with the assessment, diagnosis, and plan of care documented in the resident's note.  

## 2022-11-01 ENCOUNTER — Ambulatory Visit (INDEPENDENT_AMBULATORY_CARE_PROVIDER_SITE_OTHER): Payer: Self-pay | Admitting: Licensed Clinical Social Worker

## 2022-11-01 DIAGNOSIS — F411 Generalized anxiety disorder: Secondary | ICD-10-CM
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
## 2022-11-08 ENCOUNTER — Ambulatory Visit (INDEPENDENT_AMBULATORY_CARE_PROVIDER_SITE_OTHER): Payer: BC Managed Care – PPO | Admitting: Obstetrics and Gynecology

## 2022-11-08 ENCOUNTER — Encounter: Payer: Self-pay | Admitting: Obstetrics and Gynecology

## 2022-11-08 ENCOUNTER — Other Ambulatory Visit (HOSPITAL_COMMUNITY)
Admission: RE | Admit: 2022-11-08 | Discharge: 2022-11-08 | Disposition: A | Discharge status: Home / Self Care | Payer: BC Managed Care – PPO | Source: Ambulatory Visit | Attending: Obstetrics and Gynecology | Admitting: Obstetrics and Gynecology

## 2022-11-08 VITALS — BP 122/82 | HR 95 | Ht 61.0 in | Wt 217.1 lb

## 2022-11-08 DIAGNOSIS — Z01419 Encounter for gynecological examination (general) (routine) without abnormal findings: Secondary | ICD-10-CM | POA: Insufficient documentation

## 2022-11-08 DIAGNOSIS — Z202 Contact with and (suspected) exposure to infections with a predominantly sexual mode of transmission: Secondary | ICD-10-CM | POA: Diagnosis not present

## 2022-11-08 DIAGNOSIS — N920 Excessive and frequent menstruation with regular cycle: Secondary | ICD-10-CM | POA: Diagnosis not present

## 2022-11-08 NOTE — Progress Notes (Signed)
Pt is in the office for pap smear.  LMP 10/25/22 Pt would like to discuss ablation, reports heavy cycles, hx of BTL

## 2022-11-08 NOTE — Progress Notes (Signed)
Kerry Chavez is a 37 y.o. G8P2002 female here for a routine annual gynecologic exam.  Current complaints: [facility] for the last several yrs. Cycles monthly, heavy with cramps, clots, last 6-7 days. 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 Contraception: tubal ligation Last Pap: 2023. Results were: normal Last mammogram: 2024. 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   Anxiety    Asthma    Bleeding external hemorrhoids 06/04/2022   Depression    NSVT (nonsustained ventricular tachycardia) (HCC) 09/03/2022    Past Surgical History:  Procedure Laterality Date   CESAREAN SECTION     CESAREAN SECTION WITH BILATERAL TUBAL LIGATION Bilateral 06/12/2014   Procedure: Repeat CESAREAN SECTION WITH BILATERAL TUBAL LIGATION;  Surgeon: Maxie Better, MD;  Location: [facility] OR[facility];  Service: Obstetrics;  Laterality: Bilateral;  EDD: 06/19/14 Wants clear C-Section drape for this case   IR ANGIO INTRA EXTRACRAN SEL INTERNAL CAROTID UNI L MOD SED  12/21/2021   IR ANGIO INTRA EXTRACRAN SEL INTERNAL CAROTID UNI R MOD SED  12/21/2021   IR ANGIO INTRA EXTRACRAN SEL INTERNAL CAROTID UNI R MOD SED  06/26/2022   IR ANGIO VERTEBRAL SEL VERTEBRAL BILAT MOD SED  12/21/2021   IR ANGIOGRAM FOLLOW UP STUDY  12/21/2021   IR ANGIOGRAM FOLLOW UP STUDY  12/21/2021   IR  TRANSCATH/EMBOLIZ  12/21/2021   IR US GUIDE VASC ACCESS RIGHT  06/26/2022   RADIOLOGY WITH ANESTHESIA N/A 12/21/2021   Procedure: IR WITH ANESTHESIA;  Surgeon: Lisbeth Renshaw, MD;  Location: [facility] OR;  Service: Radiology;  Laterality: N/A;   TONSILLECTOMY AND ADENOIDECTOMY  1998    Current Outpatient Medications on File Prior to Visit  Medication Sig Dispense Refill   cholecalciferol (VITAMIN D3) 25 MCG (1000 UNIT) tablet Take 1 tablet (1,000 Units total) by mouth daily. 90 tablet 1   Ferric Maltol (ACCRUFER) 30 MG CAPS Take 1 capsule by mouth daily.     [DISCONTINUED] citalopram (CELEXA) 20 MG tablet Take 1 tablet (20 mg total) by mouth daily. 30 tablet 2   No current facility-administered medications on file prior to visit.    Allergies  Allergen Reactions   Shellfish Allergy Swelling   Ivp Dye [Iodinated Contrast Media] Other (See Comments)    Unknown reaction   Misc. Sulfonamide Containing Compounds    Other    Penicillins Hives, Itching and Swelling    REACTION: rash   Sulfamethoxazole Hives and Swelling    REACTION: rash   Latex Itching and Rash    With condoms only  Social History   Socioeconomic History   Marital status: Single    Spouse name: Not on file   Number of children: 2   Years of education: Not on file   Highest education level: Not on file  Occupational History   Occupation: Customer Service  Tobacco Use   Smoking status: Former    Current packs/day: 0.00    Average packs/day: 0.3 packs/day for 10.0 years (2.5 ttl pk-yrs)    Types: Cigarettes    Start date: 12/21/2011    Quit date: 12/20/2021    Years since quitting: 0.8    Passive exposure: Past   Smokeless tobacco: Never  Vaping Use   Vaping status: Never Used  Substance and Sexual Activity   Alcohol use: Yes    Comment: Socially   Drug use: Not Currently    Types: Marijuana   Sexual activity: Yes  Other Topics Concern   Not on file  Social History Narrative   Caffiene one daily.    Working: not right now   Lives with 2 kids.  (18 yr ,  8 ys).   Social Determinants of Health   Financial Resource Strain: High Risk (10/16/2022)   Overall Financial Resource Strain (CARDIA)    Difficulty of Paying Living Expenses: Very hard  Food Insecurity: No Food Insecurity (10/16/2022)   Hunger Vital Sign    Worried About Running Out of Food in the Last Year: Never true    Ran Out of Food in the Last Year: Never true  Transportation Needs: No Transportation Needs (10/16/2022)   PRAPARE - Administrator, Civil Service (Medical): No    Lack of Transportation (Non-Medical): No  Physical Activity: Insufficiently Active (10/16/2022)   Exercise Vital Sign    Days of Exercise per Week: 1 day    Minutes of Exercise per Session: 10 min  Stress: Stress Concern Present (10/16/2022)   [employer] of Occupational Health - Occupational Stress Questionnaire    Feeling of Stress : Very much  Social Connections: Moderately Isolated (10/16/2022)   Social Connection and Isolation Panel [NHANES]    Frequency of Communication with Friends and Family: More than three times a week    Frequency of Social Gatherings with Friends and Family: More than three times a week    Attends Religious Services: More than 4 times per year    Active Member of [employer] or Organizations: No    Attends Banker Meetings: Never    Marital Status: Never married  Intimate Partner Violence: Not At Risk (10/16/2022)   Humiliation, Afraid, Rape, and Kick questionnaire    Fear of Current or Ex-Partner: No    Emotionally Abused: No    Physically Abused: No    Sexually Abused: No    Family History  Problem Relation Age of Onset   Diabetes Mother    Cerebral aneurysm Father    CVA Father    Kidney disease Brother    Breast cancer Paternal Aunt     The following portions of the patient's history were reviewed and updated as appropriate: allergies, current medications, past family history, past  medical history, past social history, past surgical history and problem list.  Review of Systems Pertinent items are noted in HPI.   Objective:  BP 122/82   Pulse 95   Ht 5\' 1"  (1.549 m)   Wt 217 lb 1.6 oz (98.5 kg)   LMP 10/25/2022   BMI 41.02 kg/m  Chaperone present CONSTITUTIONAL: Well-developed, well-nourished female  in no acute distress.  HENT:  Normocephalic, atraumatic, External right and left ear normal. Oropharynx is clear and moist EYES: Conjunctivae and EOM are normal. Pupils are equal, round, and reactive to light. No scleral icterus.  NECK: Normal range of motion, supple, no masses.  Normal thyroid.  SKIN: Skin is warm and dry. No rash noted. Not diaphoretic. No erythema. No pallor. NEUROLGIC: Alert and oriented to person, place, and time. Normal reflexes, muscle tone coordination. No cranial nerve deficit noted. PSYCHIATRIC: Normal mood and affect. Normal behavior. Normal judgment and thought content. CARDIOVASCULAR: Normal heart rate noted, regular rhythm RESPIRATORY: Clear to auscultation bilaterally. Effort and breath sounds normal, no problems with respiration noted. BREASTS: Deferred ABDOMEN: Soft, normal bowel sounds, no distention noted.  No tenderness, rebound or guarding.  PELVIC: Normal appearing external genitalia; Small pericolitic skin tag noted normal appearing vaginal mucosa and cervix.  No abnormal discharge noted.  Pap smear obtained.  Normal uterine size, no other palpable masses, no uterine or adnexal tenderness. MUSCULOSKELETAL: Normal range of motion. No tenderness.  No cyanosis, clubbing, or edema.  2+ distal pulses.   Assessment:  Annual gynecologic examination with pap smear [facility] STD exposure Skin tag Plan:  Will follow up results of pap smear and manage accordingly. STD testing as per pt request Will check GYN U/S F/U in 3-4 weeks for test results and removal of skin tag Routine preventative health maintenance measures emphasized. Please  refer to After Visit Summary for other counseling recommendations.    Hermina Staggers, MD, FACOG Attending Obstetrician & Gynecologist Center for [facility], [place] [facility]

## 2022-11-08 NOTE — Patient Instructions (Addendum)
Health Maintenance, Female Adopting a healthy lifestyle and getting preventive care are important in promoting health and wellness. Ask your health care provider about: The right schedule for you to have regular tests and exams. Things you can do on your own to prevent diseases and keep yourself healthy. What should I know about diet, weight, and exercise? Eat a healthy diet  Eat a diet that includes plenty of vegetables, fruits, low-fat dairy products, and lean protein. Do not eat a lot of foods that are high in solid fats, added sugars, or sodium. Maintain a healthy weight Body mass index (BMI) is used to identify weight problems. It estimates body fat based on height and weight. Your health care provider can help determine your BMI and help you achieve or maintain a healthy weight. Get regular exercise Get regular exercise. This is one of the most important things you can do for your health. Most adults should: Exercise for at least 150 minutes each week. The exercise should increase your heart rate and make you sweat (moderate-intensity exercise). Do strengthening exercises at least twice a week. This is in addition to the moderate-intensity exercise. Spend less time sitting. Even light physical activity can be beneficial. Watch cholesterol and blood lipids Have your blood tested for lipids and cholesterol at 39 years of age, then have this test every 5 years. Have your cholesterol levels checked more often if: Your lipid or cholesterol levels are high. You are older than 39 years of age. You are at high risk for heart disease. What should I know about cancer screening? Depending on your health history and family history, you may need to have cancer screening at various ages. This may include screening for: Breast cancer. Cervical cancer. Colorectal cancer. Skin cancer. Lung cancer. What should I know about heart disease, diabetes, and high blood pressure? Blood pressure and heart  disease High blood pressure causes heart disease and increases the risk of stroke. This is more likely to develop in people who have high blood pressure readings or are overweight. Have your blood pressure checked: Every 3-5 years if you are 70-77 years of age. Every year if you are 29 years old or older. Diabetes Have regular diabetes screenings. This checks your fasting blood sugar level. Have the screening done: Once every three years after age 61 if you are at a normal weight and have a low risk for diabetes. More often and at a younger age if you are overweight or have a high risk for diabetes. What should I know about preventing infection? Hepatitis B If you have a higher risk for hepatitis B, you should be screened for this virus. Talk with your health care provider to find out if you are at risk for hepatitis B infection. Hepatitis C Testing is recommended for: Everyone born from [birth year] through 1967. Anyone with known risk factors for hepatitis C. Sexually transmitted infections (STIs) Get screened for STIs, including gonorrhea and chlamydia, if: You are sexually active and are younger than 39 years of age. You are older than 39 years of age and your health care provider tells you that you are at risk for this type of infection. Your sexual activity has changed since you were last screened, and you are at increased risk for chlamydia or gonorrhea. Ask your health care provider if you are at risk. Ask your health care provider about whether you are at high risk for HIV. Your health care provider may recommend a prescription medicine to help prevent HIV  infection. If you choose to take medicine to prevent HIV, you should first get tested for HIV. You should then be tested every 3 months for as long as you are taking the medicine. Pregnancy If you are about to stop having your period (premenopausal) and you may become pregnant, seek counseling before you get pregnant. Take 400 to 800  micrograms (mcg) of folic acid every day if you become pregnant. Ask for birth control (contraception) if you want to prevent pregnancy. Osteoporosis and menopause Osteoporosis is a disease in which the bones lose minerals and strength with aging. This can result in bone fractures. If you are 30 years old or older, or if you are at risk for osteoporosis and fractures, ask your health care provider if you should: Be screened for bone loss. Take a calcium or vitamin D supplement to lower your risk of fractures. Be given hormone replacement therapy (HRT) to treat symptoms of menopause. Follow these instructions at home: Alcohol use Do not drink alcohol if: Your health care provider tells you not to drink. You are pregnant, may be pregnant, or are planning to become pregnant. If you drink alcohol: Limit how much you have to: 0-1 drink a day. Know how much alcohol is in your drink. In the [place] one drink equals one 12 oz bottle of beer (355 mL), one 5 oz glass of wine (148 mL), or one 1 oz glass of hard liquor (44 mL). Lifestyle Do not use any products that contain nicotine or tobacco. These products include cigarettes, chewing tobacco, and vaping devices, such as e-cigarettes. If you need help quitting, ask your health care provider. Do not use street drugs. Do not share needles. Ask your health care provider for help if you need support or information about quitting drugs. General instructions Schedule regular health, dental, and eye exams. Stay current with your vaccines. Tell your health care provider if: You often feel depressed. You have ever been abused or do not feel safe at home. Summary Adopting a healthy lifestyle and getting preventive care are important in promoting health and wellness. Follow your health care provider's instructions about healthy diet, exercising, and getting tested or screened for diseases. Follow your health care provider's instructions on monitoring your  cholesterol and blood pressure. This information is not intended to replace advice given to you by your health care provider. Make sure you discuss any questions you have with your health care provider. Document Revised: 08/01/2020 Document Reviewed: 08/01/2020 Elsevier Patient Education  2024 Elsevier Inc. Menorrhagia Menorrhagia is when your monthly periods are heavy or last longer than normal. If you have this condition, bleeding and cramping may make it hard for you to do your daily activities. What are the causes? Common causes of this condition include: Growths in the womb (uterus). These are polyps or fibroids. These growths are not cancer. Problems with two hormones called estrogen and progesterone. One of the ovaries not releasing an egg during one or more months. A problem with the thyroid gland. Having a device for birth control (IUD). Side effects of some medicines, such as NSAIDs or blood thinners. A disorder that stops the blood from clotting normally. What increases the risk? You are more likely to have this condition if you have cancer of the womb. What are the signs or symptoms? Having to change your pad or tampon every 1-2 hours because it is soaked. Needing to use pads and tampons at the same time because of heavy bleeding. Needing to wake up  to change your pads or tampons during the night. Passing blood clots larger than 1 inch (2.5 cm) in size. Having bleeding that lasts for more than 7 days. Having symptoms of low iron levels (anemia), such as feeling tired or having shortness of breath. How is this treated? You may not need to be treated for this condition. But if you need treatment, you may be given medicines: To reduce bleeding during your period. These include birth control medicines. To make your blood thick. This slows bleeding. To reduce swelling. Medicines that do this include ibuprofen. That have a hormone called progestin. That make the ovaries stop  working for a short time. To treat low iron levels. You will be given iron pills if you have this condition. If medicines do not work, surgery may be done. Surgery may be done to: Remove a part of the lining of the womb. This lining is called the endometrium. This reduces bleeding during a period. Remove growths in the womb. These may be polyps or fibroids. Remove the entire lining of the womb. Remove the womb entirely. This procedure is called a hysterectomy. Follow these instructions at home: Medicines Take over-the-counter and prescription medicines only as told by your doctor. This includes iron pills. Do not change or switch medicines without asking your doctor. Do not take aspirin or medicines that contain aspirin 1 week before or during your period. Aspirin may make bleeding worse. Managing constipation Iron pills may cause trouble pooping (constipation). To prevent or treat problems when pooping, you may need to: Drink enough fluid to keep your pee (urine) pale yellow. Take over-the-counter or prescription medicines. Eat foods that are high in fiber. These include beans, whole grains, and fresh fruits and vegetables. Limit foods that are high in fat and sugar. These include fried or sweet foods. General instructions If you need to change your pad or tampon more than once every 2 hours, limit your activity until the bleeding stops. Eat healthy meals and foods that are high in iron. Foods that have a lot of iron include: Leafy green vegetables. Meat. Liver. Eggs. Whole-grain breads and cereals. Do not try to lose weight until your heavy bleeding has stopped and you have normal amounts of iron in your blood. If you need to lose weight, work with your doctor. Keep all follow-up visits. Contact a doctor if: You soak through a pad or tampon every 1 or 2 hours, and this happens every time you have a period. You need to use pads and tampons at the same time because you are bleeding so  much. You are taking medicine, and: You feel like you may vomit. You vomit. You have watery poop (diarrhea). You have other problems that may be related to the medicine you are taking. Get help right away if: You soak through more than a pad or tampon in 1 hour. You pass clots bigger than 1 inch (2.5 cm) wide. You feel short of breath. You feel like your heart is beating too fast. You feel dizzy or you faint. You feel very weak or tired. Summary Menorrhagia is when your menstrual periods are heavy or last longer than normal. You may not need to be treated for this condition. If you need treatment, you may be given medicines or have surgery. Take over-the-counter and prescription medicines only as told by your doctor. This includes iron pills. Get help right away if you soak through more than a pad or tampon in 1 hour or you pass large  clots. Also, get help right away if you feel dizzy, short of breath, or very weak or tired. This information is not intended to replace advice given to you by your health care provider. Make sure you discuss any questions you have with your health care provider. Document Revised: 11/24/2019 Document Reviewed: 11/24/2019 Elsevier Patient Education  2024 [employer].

## 2022-11-09 LAB — CERVICOVAGINAL ANCILLARY ONLY
Bacterial Vaginitis (gardnerella): POSITIVE — AB
Candida Glabrata: NEGATIVE
Candida Vaginitis: NEGATIVE
Chlamydia: NEGATIVE
Comment: NEGATIVE
Comment: NEGATIVE
Comment: NEGATIVE
Comment: NEGATIVE
Comment: NEGATIVE
Comment: NORMAL
Neisseria Gonorrhea: NEGATIVE
Trichomonas: NEGATIVE

## 2022-11-09 LAB — CYTOLOGY - PAP
Comment: NEGATIVE
Diagnosis: NEGATIVE
High risk HPV: NEGATIVE

## 2022-11-13 ENCOUNTER — Other Ambulatory Visit: Payer: Self-pay

## 2022-11-13 ENCOUNTER — Telehealth: Payer: Self-pay

## 2022-11-13 DIAGNOSIS — B9689 Other specified bacterial agents as the cause of diseases classified elsewhere: Secondary | ICD-10-CM

## 2022-11-13 MED ORDER — METRONIDAZOLE 500 MG PO TABS
500.0000 mg | ORAL_TABLET | Freq: Two times a day (BID) | ORAL | 0 refills | Status: AC
Start: 2022-11-13 — End: 2022-11-20

## 2022-11-13 NOTE — Telephone Encounter (Signed)
-----   Message from Kerry Chavez sent at 11/13/2022 10:02 AM EDT ----- Please let pt know that her pap smear was normal Vaginal swab revealed BV Send in Rx for Tx for BV  Thanks Casimiro Needle

## 2022-11-13 NOTE — Telephone Encounter (Signed)
Pt informed of lab results, Rx for Bv sent.

## 2022-11-14 DIAGNOSIS — K635 Polyp of colon: Secondary | ICD-10-CM | POA: Diagnosis not present

## 2022-11-14 DIAGNOSIS — K648 Other hemorrhoids: Secondary | ICD-10-CM | POA: Diagnosis not present

## 2022-11-14 DIAGNOSIS — K625 Hemorrhage of anus and rectum: Secondary | ICD-10-CM | POA: Diagnosis not present

## 2022-11-15 ENCOUNTER — Ambulatory Visit (HOSPITAL_BASED_OUTPATIENT_CLINIC_OR_DEPARTMENT_OTHER): Payer: BC Managed Care – PPO

## 2022-11-20 DIAGNOSIS — K635 Polyp of colon: Secondary | ICD-10-CM | POA: Diagnosis not present

## 2022-11-20 DIAGNOSIS — K625 Hemorrhage of anus and rectum: Secondary | ICD-10-CM | POA: Diagnosis not present

## 2022-11-21 ENCOUNTER — Ambulatory Visit: Payer: BC Managed Care – PPO | Admitting: Cardiology

## 2022-11-21 ENCOUNTER — Encounter: Payer: Self-pay | Admitting: Cardiology

## 2022-11-21 VITALS — BP 110/76 | HR 98 | Resp 16 | Ht 61.0 in | Wt 220.6 lb

## 2022-11-21 DIAGNOSIS — R072 Precordial pain: Secondary | ICD-10-CM | POA: Diagnosis not present

## 2022-11-21 DIAGNOSIS — I4729 Other ventricular tachycardia: Secondary | ICD-10-CM | POA: Diagnosis not present

## 2022-11-21 MED ORDER — METOPROLOL SUCCINATE ER 25 MG PO TB24: 25.0000 mg | ORAL_TABLET | Freq: Every day | ORAL | 3 refills | Status: DC

## 2022-11-21 NOTE — Progress Notes (Signed)
Patient referred by Champ Mungo, DO for chest pain  Subjective:   Kerry Chavez, female    DOB: 1985/09/29, 37 y.o.   MRN: [id number]   Chief Complaint  Patient presents with   Follow-up   NSVT (nonsustained ventricular tachycardia) (HCC)     HPI  37 y.o. African-American female with hypertension, h/o aneurysmal subarachnoid hemorrhage, anxiety, depression, referred for evaluation of chest pain.  Patient denies any exertional chest pain.  She has episodes of waking up middle of night feeling "panic attacks".  This has been ongoing for several years ever since her aneurysmal subarachnoid hemorrhage.  Discussed recent stress test results with the patient, details below.  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  She has made excellent neurological recovery, but has had significant anxiety depression since then.  She has episodes of chest pain that occur for few minutes, both at rest, but sometimes with exertion.  She is also noticed her heart rate goes very fast with minimal activity.  She has been struggling with anxiety and depression, sees a therapist and is also on medications for the same.  She does not smoke.    Current Outpatient Medications:    cholecalciferol (VITAMIN D3) 25 MCG (1000 UNIT) tablet, Take 1 tablet (1,000 Units total) by mouth daily., Disp: 90 tablet, Rfl: 1   Ferric Maltol (ACCRUFER) 30 MG CAPS, Take 1 capsule by mouth daily., Disp: , Rfl:    Cardiovascular and other pertinent studies:  Reviewed external labs and tests, independently interpreted  EKG 07/23/2022: Sinus tachycardia 105 bpm  Nonspecific T-abnormality  Regadenoson Nuclear stress test 10/01/2022: Breast attenuation noted in the inferior region. Cannot exclude small sized mild ischemia in the apical inferior and apical infero-septal  region.  Overall LV systolic function is normal without regional wall motion abnormalities. Stress LV EF: 41%, but visually appears normal.  Nondiagnostic ECG stress. The heart rate response was consistent with Regadenoson.  No previous exam available for comparison. Low risk.   Echocardiogram 08/28/2022: Normal LV systolic function with visual EF 55-60%. Left ventricle cavity is normal in size. Normal left ventricular wall thickness. Normal global wall motion. Normal diastolic filling pattern, normal LAP. 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 Mild (Grade I) mitral regurgitation. Mild tricuspid regurgitation. No evidence of pulmonary hypertension. No prior study for comparison.  Exercise treadmill stress test 08/08/2022: Exercise treadmill stress test performed using Bruce protocol.  Patient exercised for 5 minutes and 1 seconds, reached 7 METS, and 77%% of age predicted maximum heart rate.  Exercise capacity was low.  No chest pain reported.  Heart rate response limited due to low exercise capacity. Normal blood pressure response. Stress EKG at 77% MPHR revealed no ischemic changes. Inconclusive exercise treadmill stress test due to inability to reach 85% MPHR.  Mobile cardiac telemetry 13 days 07/23/2022 - 08/06/2022: Dominant rhythm: Sinus. HR 56-146 bpm. Avg HR 98 bpm, in sinus rhythm. 0 episodes of SVT. <1% isolated SVE, couplet, no triplets. 4 episodes of VT, fastest and longest at 194 bpm for 11 beats. <1% isolated VE, couplet, no triplets. No atrial fibrillation/atrial flutter/SVT/high grade AV block, sinus pause >3sec noted. 0 patient triggered events.   Recent labs: 09/2022: Glucose 122, BUN/Cr 6/0.69. EGFR >60. Na/K 137/4.0. Rest of the CMP normal H/H 12/37. MCV 85. Platelets 326   07/08/2022: Glucose  96, BUN/Cr 6/0.6. EGFR NA. K 4.2. Hb 10.7. TSH 0.8 normal   Review of Systems  Cardiovascular:  Positive for  palpitations. Negative for chest pain, dyspnea on exertion, leg swelling and syncope.         Vitals:   11/21/22 1209  BP: 110/76  Pulse: 98  Resp: 16  SpO2: 99%      Body mass index is 41.68 kg/m. Filed Weights   11/21/22 1209  Weight: 220 lb 9.6 oz (100.1 kg)      Objective:   Physical Exam Vitals and nursing note reviewed.  Constitutional:      General: She is not in acute distress. Neck:     Vascular: No JVD.  Cardiovascular:     Rate and Rhythm: Normal rate and regular rhythm.     Heart sounds: Normal heart sounds. No murmur heard. Pulmonary:     Effort: Pulmonary effort is normal.     Breath sounds: Normal breath sounds. No wheezing or rales.  Musculoskeletal:     Right lower leg: No edema.     Left lower leg: No edema.           Visit diagnoses:   ICD-10-CM   1. Precordial pain  R07.2 CT CARDIAC SCORING (SELF PAY ONLY)    Lipid panel    2. NSVT (nonsustained ventricular tachycardia) (HCC)  I47.29         Orders Placed This Encounter  Procedures   CT CARDIAC SCORING (SELF PAY ONLY)   Lipid panel      Assessment & Recommendations:    37 y.o. African-American female with hypertension, iron deficiency anemia, h/o aneurysmal subarachnoid hemorrhage, anxiety, depression, referred for evaluation of chest pain.  Chest pain: No recent recurrence. Submaximal exercise treadmill stress test, tissue attenuation on Lexiscan nuclear stress test. In absence of clear anginal symptoms, hold off any cardiac catheterization at this time. Will check calcium score scan and lipid panel for risk stratification.  Palpitations: Episodes of NSVT.  Reason for administration, recommend metoprolol succinate 25 mg daily.   F/u in 6 months   Elder Negus, MD Pager: [phone] Office: [phone]

## 2022-11-22 ENCOUNTER — Encounter (HOSPITAL_BASED_OUTPATIENT_CLINIC_OR_DEPARTMENT_OTHER): Payer: Self-pay | Admitting: Emergency Medicine

## 2022-11-22 ENCOUNTER — Ambulatory Visit (HOSPITAL_BASED_OUTPATIENT_CLINIC_OR_DEPARTMENT_OTHER)
Admission: RE | Admit: 2022-11-22 | Discharge: 2022-11-22 | Disposition: A | Discharge status: Home / Self Care | Payer: BC Managed Care – PPO | Source: Ambulatory Visit | Attending: Obstetrics and Gynecology | Admitting: Obstetrics and Gynecology

## 2022-11-22 DIAGNOSIS — N920 Excessive and frequent menstruation with regular cycle: Secondary | ICD-10-CM | POA: Insufficient documentation

## 2022-11-29 ENCOUNTER — Other Ambulatory Visit: Payer: Self-pay | Admitting: Student

## 2022-12-03 ENCOUNTER — Ambulatory Visit: Payer: BC Managed Care – PPO | Admitting: Obstetrics and Gynecology

## 2022-12-03 ENCOUNTER — Other Ambulatory Visit (HOSPITAL_COMMUNITY)
Admission: RE | Admit: 2022-12-03 | Discharge: 2022-12-03 | Disposition: A | Discharge status: Home / Self Care | Payer: BC Managed Care – PPO | Source: Ambulatory Visit | Attending: Obstetrics and Gynecology | Admitting: Obstetrics and Gynecology

## 2022-12-03 ENCOUNTER — Encounter: Payer: Self-pay | Admitting: Obstetrics and Gynecology

## 2022-12-03 ENCOUNTER — Ambulatory Visit: Payer: BC Managed Care – PPO | Admitting: Cardiology

## 2022-12-03 VITALS — BP 105/75 | HR 98 | Ht 61.0 in | Wt 219.0 lb

## 2022-12-03 DIAGNOSIS — N920 Excessive and frequent menstruation with regular cycle: Secondary | ICD-10-CM | POA: Diagnosis not present

## 2022-12-03 DIAGNOSIS — N939 Abnormal uterine and vaginal bleeding, unspecified: Secondary | ICD-10-CM | POA: Diagnosis not present

## 2022-12-03 DIAGNOSIS — L918 Other hypertrophic disorders of the skin: Secondary | ICD-10-CM | POA: Diagnosis present

## 2022-12-03 NOTE — Progress Notes (Signed)
37 y.o. GYN presents for FU and Skin Tag removal.

## 2022-12-03 NOTE — Progress Notes (Signed)
Kerry Chavez presents for discussion of GYN U/S and removal of skin tag See prior office notes Gyn U/S results reviewed with pt GYN U/S is normal  PE AF VSS Chaperone present  Procedure  After informed consent was obtained. Area surrounding skin tag was prepped in the usual sterile fashion Anesthized with 1 cc 1% lidocaine Skin tag was grasped with pick ups and removed Skin tag was sent to pathology Skin tag area was the cauterized with silver nitrate Hemostasis obtained General wound care reviewed with pt Pt tolerated procedure well.   A/P [name]        Skin tag  S/P removal. Discuss Tx options for [name]. Pt to discuss IUD with neurologist due to H/O CVA. Will call back with neurology's recommendation F/U PRN

## 2022-12-04 ENCOUNTER — Ambulatory Visit (INDEPENDENT_AMBULATORY_CARE_PROVIDER_SITE_OTHER): Payer: BC Managed Care – PPO | Admitting: Licensed Clinical Social Worker

## 2022-12-04 DIAGNOSIS — F411 Generalized anxiety disorder: Secondary | ICD-10-CM | POA: Diagnosis not present

## 2022-12-04 LAB — SURGICAL PATHOLOGY

## 2022-12-04 NOTE — BH Specialist Note (Signed)
Integrated Behavioral Health via Telemedicine Visit  12/04/2022 Kerry Chavez [id number]  Number of Integrated Behavioral Health Clinician visits: Additional Visit  Session Start time: 1100   Session End time: 1200  Total time in minutes: 60   Referring Provider: Champ Mungo, DO Patient/Family location: Home [facility] Provider location: Office All persons participating in visit: [facility] and Patient Types of Service: Telephone visit and Health & Behavioral Assessment/Intervention  I connected with Kerry Chavez and/or Kerry Chavez  via  Telephone or Video Enabled Telemedicine Application  (Video is Caregility application) and verified that I am speaking with the correct person using two identifiers. Discussed confidentiality: Yes   I discussed the limitations of telemedicine and the availability of in person appointments.  Discussed there is a possibility of technology failure and discussed alternative modes of communication if that failure occurs.  I discussed that engaging in this telemedicine visit, they consent to the provision of behavioral healthcare and the services will be billed under their insurance.  Patient and/or legal guardian expressed understanding and consented to Telemedicine visit: Yes   Presenting Concerns: Since our last visit, Kerry Chavez has made significant strides in her emotional and social well-being. She has been actively journaling, increasing her engagement in prayer, and attending her son's football games, which marks a notable improvement from her previous struggles with socializing and participating in family activities. Kerry Chavez expresses excitement about these positive changes in her life. However, she continues to grapple with PTSD related to her past cancer diagnoses and surgeries. Despite medical reports indicating that she is in good health, she harbors concerns about the possibility of receiving negative health news in the future. During our session,  the Behavioral Health Consultant [facility]) encouraged Kerry Chavez to celebrate her current victories and to practice meditation as a means of enhancing her mental health. While exercise was recommended to improve her overall well-being, Kerry Chavez expressed reluctance to exert herself physically. In response, the [facility] suggested chair yoga as a gentle alternative, pending approval from her primary care provider (PCP). To support her in this endeavor, the [facility] provided Kerry Chavez with instructional videos on chair yoga and discussed its benefits, emphasizing the importance of maintaining a balanced approach to both mental and physical health as she navigates her ongoing recovery journey.  Patient and/or Family's Strengths/Protective Factors: Sense of purpose  Goals Addressed: Patient will:  Reduce symptoms of: anxiety and depression   Increase knowledge and/or ability of: coping skills   Demonstrate ability to: Increase healthy adjustment to current life circumstances  Progress towards Goals: Ongoing  Interventions: Interventions utilized:  Motivational Interviewing, Supportive Counseling, and Supportive Reflection Standardized Assessments completed: PHQ-SADS  Patient and/or Family Response: Patient agrees to ongoing therapy  .  Plan: Follow up with behavioral health clinician on : within two weeks   I discussed the assessment and treatment plan with the patient and/or parent/guardian. They were provided an opportunity to ask questions and all were answered. They agreed with the plan and demonstrated an understanding of the instructions.   They were advised to call back or seek an in-person evaluation if the symptoms worsen or if the condition fails to improve as anticipated.  Christen Butter, MSW, LCSW-A She/Her Behavioral Health Clinician [facility]  Internal Medicine Center Direct Dial:[phone]  Fax [phone] Main Office Phone: [phone] [address] [place] [place], [place]  [address] Website: [facility] Internal Medicine [facility]  [place], [place]  [place]

## 2022-12-05 NOTE — Telephone Encounter (Signed)
I spoke with the patient and she informed me that she needed to cancel her SS at this time and will call back later to r/s.

## 2022-12-20 ENCOUNTER — Ambulatory Visit (INDEPENDENT_AMBULATORY_CARE_PROVIDER_SITE_OTHER): Payer: BC Managed Care – PPO | Admitting: Licensed Clinical Social Worker

## 2022-12-20 DIAGNOSIS — F419 Anxiety disorder, unspecified: Secondary | ICD-10-CM

## 2022-12-20 NOTE — BH Specialist Note (Signed)
Integrated Behavioral Health via Telemedicine Visit  12/20/2022 Kerry Chavez [id number]  Number of Integrated Behavioral Health Clinician visits: Additional Visit  Session Start time: 1330   Session End time: 1430  Total time in minutes: 60   Referring Provider: Faith Rogue, DO  Patient/Family location: Home [facility] Provider location: [place] All persons participating in visit: Patient and Kerry Chavez Types of Service: Individual psychotherapy  I connected with Kerry Chavez via  Telephone or Video Enabled  and verified that I am speaking with the correct person using two identifiers. Discussed confidentiality: Yes   I discussed the limitations of telemedicine and the availability of in person appointments.  Discussed there is a possibility of technology failure and discussed alternative modes of communication if that failure occurs.  I discussed that engaging in this telemedicine visit, they consent to the provision of behavioral healthcare and the services will be billed under their insurance.  Patient and/or legal guardian expressed understanding and consented to Telemedicine visit: Yes   Presenting Concerns: Patient and/or family reports the following symptoms/concerns: During today's follow-up session, the patient reported a significant milestone in their smoking cessation journey, having successfully abstained from smoking for one full year as of 12/21/2022. The patient expressed pride in this achievement and noted several positive impacts of their smoke-free lifestyle. They reported no longer experiencing lightheadedness, which had been a frequent occurrence when they were smoking. The patient also mentioned weight gain as a side effect of quitting, though they did not express significant concern about this. Interestingly, the patient noted an increased sensitivity to the smell of smoke, which they now find unpleasant, further reinforcing their commitment to remaining smoke-free. On a  less positive note, the patient disclosed that their long-term disability benefits have been discontinued, and they are currently in the process of appealing this decision. This situation is causing the patient considerable stress, which we discussed as a potential trigger for relapse. We explored coping strategies to manage this stress without resorting to smoking. The patient's one-year smoke-free anniversary presents an opportunity to reinforce the health benefits they've experienced and to encourage continued abstinence, especially in the face of current stressors. We agreed to closely monitor their stress levels and discuss additional support options if needed during the appeal process for their disability benefits. Duration of problem: Not reported; Severity of problem: moderate  Patient and/or Family's Strengths/Protective Factors: Social connections  Goals Addressed: Patient will:  Reduce symptoms of: anxiety   Increase knowledge and/or ability of: coping skills   Demonstrate ability to: Increase healthy adjustment to current life circumstances  Progress towards Goals: Ongoing  Interventions: Interventions utilized:  Motivational Interviewing, Solution-Focused Strategies, and CBT Cognitive Behavioral Therapy Standardized Assessments completed: PHQ-SADS  Patient and/or Family Response: Patient agrees to continued therapy  Assessment: Patient currently experiencing depression.   Patient may benefit from Ongoing therapy.  Plan: Follow up with behavioral health clinician on : Within the next 30 days   I discussed the assessment and treatment plan with the patient and/or parent/guardian. They were provided an opportunity to ask questions and all were answered. They agreed with the plan and demonstrated an understanding of the instructions.   They were advised to call back or seek an in-person evaluation if the symptoms worsen or if the condition fails to improve as  anticipated. Christen Butter, MSW, LCSW-A She/Her Behavioral Health Clinician [facility]  Internal Medicine Center Direct Dial:[phone]  Fax [phone] Main Office Phone: [phone] [address] [place] [place], [place] [address] Website: [facility]  Primary Care  [place], [place]  [place]

## 2022-12-20 NOTE — BH Specialist Note (Deleted)
Something  Been a year of 12/21/2022- no smoking   Positive impact of not smoking - no more lightheaded , weight gain, smelling smoke   Stopped long term disability/ appealing- stressing   g

## 2023-01-01 ENCOUNTER — Ambulatory Visit (INDEPENDENT_AMBULATORY_CARE_PROVIDER_SITE_OTHER): Payer: Medicaid Other | Admitting: Licensed Clinical Social Worker

## 2023-01-01 DIAGNOSIS — F419 Anxiety disorder, unspecified: Secondary | ICD-10-CM | POA: Diagnosis present

## 2023-01-01 NOTE — BH Specialist Note (Signed)
Integrated Behavioral Health via Telemedicine Visit  01/01/2023 Alzina Donarski [id number]  Number of Integrated Behavioral Health Clinician visits: Additional Visit  Session Start time: 1400   Session End time: 1500  Total time in minutes: 60   Referring Provider: PCP Patient/Family location: Home [facility] Provider location: Office All persons participating in visit: [facility] and Patient Types of Service: Individual psychotherapy  I connected with [name] via  Telephone  and verified that I am speaking with the correct person using two identifiers. Discussed confidentiality: Yes   I discussed the limitations of telemedicine and the availability of in person appointments.  Discussed there is a possibility of technology failure and discussed alternative modes of communication if that failure occurs.  I discussed that engaging in this telemedicine visit, they consent to the provision of behavioral healthcare and the services will be billed under their insurance.  Patient and/or legal guardian expressed understanding and consented to Telemedicine visit: Yes   Presenting Concerns: Patient and/or family reports the following symptoms/concerns: The patient has successfully maintained abstinence from smoking for one year as of 12/21/2022. She reported several positive impacts from quitting, including the cessation of lightheadedness, manageable weight gain, and no longer smelling like smoke. The patient expressed concerns regarding stress related to the cessation of her long-term disability benefits and is currently appealing this decision, which has contributed to her anxiety levels. During the telehealth session, we discussed coping strategies to manage her stress and anxiety effectively. A follow-up appointment is scheduled to monitor her progress and provide additional support as needed.  Patient and/or Family's Strengths/Protective Factors: Social connections  Goals Addressed: Patient  will:  Reduce symptoms of: anxiety   Increase knowledge and/or ability of: coping skills   Demonstrate ability to: Increase healthy adjustment to current life circumstances  Progress towards Goals: Ongoing  Interventions: Interventions utilized:  CBT Cognitive Behavioral Therapy   Patient and/or Family Response: Patient agrees to continued services  Assessment: Patient currently experiencing Anxiety.   Patient may benefit from Individual Therapy.  Plan: Follow up with behavioral health clinician on : Within the next 30 days.  I discussed the assessment and treatment plan with the patient and/or parent/guardian. They were provided an opportunity to ask questions and all were answered. They agreed with the plan and demonstrated an understanding of the instructions.   They were advised to call back or seek an in-person evaluation if the symptoms worsen or if the condition fails to improve as anticipated.  Christen Butter, MSW, LCSW-A She/Her Behavioral Health Clinician [facility]  Internal Medicine Center Direct [phone]  Fax [phone] Main Office Phone: [phone] [address] [place] [place], [place] [address] Website: [facility] Internal Medicine [facility]  [place], [place]  [place]

## 2023-01-04 ENCOUNTER — Ambulatory Visit (INDEPENDENT_AMBULATORY_CARE_PROVIDER_SITE_OTHER): Payer: BC Managed Care – PPO | Admitting: Pediatric Genetics

## 2023-01-07 DIAGNOSIS — I609 Nontraumatic subarachnoid hemorrhage, unspecified: Secondary | ICD-10-CM | POA: Diagnosis not present

## 2023-01-22 ENCOUNTER — Ambulatory Visit: Payer: Medicaid Other | Admitting: Medical Genetics

## 2023-05-21 ENCOUNTER — Encounter: Payer: Self-pay | Admitting: Obstetrics & Gynecology

## 2023-05-21 ENCOUNTER — Ambulatory Visit: Payer: Medicaid Other | Admitting: Obstetrics & Gynecology

## 2023-05-21 VITALS — BP 127/88 | HR 102 | Wt 224.0 lb

## 2023-05-21 DIAGNOSIS — F3289 Other specified depressive episodes: Secondary | ICD-10-CM | POA: Diagnosis not present

## 2023-05-21 DIAGNOSIS — N939 Abnormal uterine and vaginal bleeding, unspecified: Secondary | ICD-10-CM

## 2023-05-21 DIAGNOSIS — Z30430 Encounter for insertion of intrauterine contraceptive device: Secondary | ICD-10-CM | POA: Diagnosis not present

## 2023-05-21 DIAGNOSIS — Z3202 Encounter for pregnancy test, result negative: Secondary | ICD-10-CM

## 2023-05-21 LAB — POCT URINE PREGNANCY: Preg Test, Ur: NEGATIVE

## 2023-05-21 MED ORDER — ACCRUFER 30 MG PO CAPS: 1.0000 | ORAL_CAPSULE | Freq: Every day | ORAL | 11 refills | Status: DC

## 2023-05-21 MED ORDER — ESCITALOPRAM OXALATE 20 MG PO TABS: 20.0000 mg | ORAL_TABLET | Freq: Every day | ORAL | 5 refills | Status: AC

## 2023-05-21 MED ORDER — LEVONORGESTREL 20 MCG/DAY IU IUD
1.0000 | INTRAUTERINE_SYSTEM | Freq: Once | INTRAUTERINE | Status: AC
Start: 2023-05-21 — End: 2023-05-21
  Administered 2023-05-21: 1 via INTRAUTERINE

## 2023-05-21 NOTE — Progress Notes (Signed)
    GYNECOLOGY PROGRESS NOTE  Subjective:    Patient ID: Kerry Chavez, female    DOB: [birth year], 38 y.o.   MRN: [id number]  HPI  Patient is a 38 y.o. [id number] here for Mirena IUD insertion to manage her AUB.   She has had 2 subarachnoid hemorrhages due to brain aneurysms. Her neurologist has approved Mirena as treatment for her menorrhagia.   She is very very anxious and has some depression centering around her fears for her life due to the aneurysms. She has occasionally used her mom's valium as an aid. She is willing to try a SSRI. She has been prescribed these meds by a psychiatrist but has not yet tried one. She is willing to try one as of now.  The following portions of the patient's history were reviewed and updated as appropriate: allergies, current medications, past family history, past medical history, past social history, past surgical history, and problem list.  Review of Systems Pertinent items are noted in HPI.   Objective:   Blood pressure 127/88, pulse (!) 102, weight 224 lb (101.6 kg), last menstrual period 05/06/2023. Body mass index is 42.32 kg/m. Well nourished, well hydrated Black female, no apparent distress She is ambulating and conversing normally. General:  alert   Breasts:  inspection negative, no nipple discharge or bleeding, no masses or nodularity palpable  Lungs: clear to auscultation bilaterally  Heart:  regular rate and rhythm, S1, S2 normal, no murmur, click, rub or gallop  Abdomen: soft, non-tender; bowel sounds normal; no masses,  no organomegaly   Vulva:  normal  Vagina: normal  Cervix:  No lesions, normal discharge  Corpus: normal size and shape, anteverted uterus, normal adnexal exam  UPT negative, consent signed, Time out procedure done. Bimanual exam revealed a uterus NSSA, no adnexal masses or tenderness Cervix prepped with betadine and Hurricaine spray and then grasped with a single tooth tenaculum. Mirena was easily placed and the  strings were cut to 3-4 cm. Uterus sounded to 9 cm. She tolerated the procedure well.   Adnexa:  not enlarged or painful  Rectal Exam: Not performed.      Assessment:   1. Other depression   2. Abnormal uterine bleeding (AUB)   3. Encounter for IUD insertion      Plan:   1. Other depression (Primary) Lexapro 20 mg qhs  2. Abnormal uterine bleeding (AUB) Mirena placed  3. Encounter for IUD insertion  - POCT urine pregnancy

## 2023-05-21 NOTE — Progress Notes (Signed)
 Pt presents for GYN visit for prolonged menstrual cycles. Cycle has been on for a couple of weeks. Pt also has associated hemorrhoids with her menstrual cycle. Pt does admit being under a lot of stress as well. Has left breast enlargement, has had positive pregnancy tests even though not pregnant. (Had tubes tied). Pap normal 10/2022. Requests refill of accrufer.

## 2023-05-24 ENCOUNTER — Ambulatory Visit: Payer: Medicaid Other | Admitting: Cardiology

## 2023-06-25 ENCOUNTER — Ambulatory Visit: Payer: Medicaid Other | Admitting: Obstetrics & Gynecology

## 2023-06-25 VITALS — BP 105/78 | HR 77 | Ht 61.0 in | Wt 225.2 lb

## 2023-06-25 DIAGNOSIS — N760 Acute vaginitis: Secondary | ICD-10-CM

## 2023-06-25 DIAGNOSIS — Z30431 Encounter for routine checking of intrauterine contraceptive device: Secondary | ICD-10-CM | POA: Diagnosis not present

## 2023-06-25 DIAGNOSIS — D500 Iron deficiency anemia secondary to blood loss (chronic): Secondary | ICD-10-CM

## 2023-06-25 DIAGNOSIS — Z8639 Personal history of other endocrine, nutritional and metabolic disease: Secondary | ICD-10-CM

## 2023-06-25 DIAGNOSIS — F3289 Other specified depressive episodes: Secondary | ICD-10-CM

## 2023-06-25 DIAGNOSIS — Z975 Presence of (intrauterine) contraceptive device: Secondary | ICD-10-CM

## 2023-06-25 DIAGNOSIS — E559 Vitamin D deficiency, unspecified: Secondary | ICD-10-CM

## 2023-06-25 DIAGNOSIS — N939 Abnormal uterine and vaginal bleeding, unspecified: Secondary | ICD-10-CM | POA: Diagnosis not present

## 2023-06-25 DIAGNOSIS — B9689 Other specified bacterial agents as the cause of diseases classified elsewhere: Secondary | ICD-10-CM | POA: Diagnosis not present

## 2023-06-25 DIAGNOSIS — N921 Excessive and frequent menstruation with irregular cycle: Secondary | ICD-10-CM | POA: Diagnosis not present

## 2023-06-25 MED ORDER — METRONIDAZOLE 500 MG PO TABS: 500.0000 mg | ORAL_TABLET | Freq: Two times a day (BID) | ORAL | 0 refills | Status: DC

## 2023-06-25 MED ORDER — NORETHINDRONE 0.35 MG PO TABS: ORAL_TABLET | ORAL | 11 refills | Status: DC

## 2023-06-25 NOTE — Progress Notes (Signed)
    GYNECOLOGY PROGRESS NOTE  Subjective:    Patient ID: Kerry Chavez, female    DOB: [birth year], 38 y.o.   MRN: [id number]  HPI  Patient is a 38 y.o. [id number]  here for 2 issues: 1) string check on Mirena, inserted last month to manage her menorrhagia and anemia.  - She has overall less bleeding with Mirena than prior to Mirena, no more clots. However, she is having some daily spotting, very annoying to her.  2) depression/anxiety surrounding her diagnosis of brain aneurysms/subarachnoid hemorrhages.  At her first/last visit, I prescribed lexapro 20 mg at bedtime. She had been using her grandmother's valium prn. She reports that her depression is improved, she no longer thinks about her death "all the time". She does feel that some psychology therapy might be helpful.   The following portions of the patient's history were reviewed and updated as appropriate: allergies, current medications, past family history, past medical history, past social history, past surgical history, and problem list.  Review of Systems Pertinent items are noted in HPI.  Pap was normal in 2024.  Objective:   Blood pressure 105/78, pulse 77, height 5\' 1"  (1.549 m), weight 225 lb 3.2 oz (102.2 kg), last menstrual period 06/11/2023. Body mass index is 42.55 kg/m.   Assessment:   1. Abnormal uterine bleeding (AUB)   2. Other depression   3. Vitamin D deficiency   4. IUD check up   5. Breakthrough bleeding with IUD   6. BV (bacterial vaginosis)   7. Iron deficiency anemia due to chronic blood loss   8. History of low potassium      Plan:   1. Abnormal uterine bleeding (AUB) (Primary) - I have rec'd trial of IBU 200 mg every day-QID prn bleeding. I also prescribed micronor to be used prn  2. Other depression  - TSH + free T4 - Vitamin D 1,25 dihydroxy - B12 - Ambulatory referral to Psychology  3. Vitamin D deficiency  - Vitamin D 1,25 dihydroxy  4. IUD check up   5. Breakthrough bleeding  with IUD   6. BV (bacterial vaginosis) - She has tried boric acid and is willing to continue. I gave her a prescription for flagyl to be used if the boric acid doesn't work  7. Iron deficiency anemia due to chronic blood loss  - CBC  8. History of low potassium  - Comp Met (CMET)  9. Depression - continue Lexapro and refer for talk therapy

## 2023-06-25 NOTE — Progress Notes (Signed)
 Pt presents for f/u. Has questions about why she keeps bleeding while on the IUD. Pt would like her iron checked.

## 2023-06-26 ENCOUNTER — Ambulatory Visit: Payer: Medicaid Other | Attending: Cardiology | Admitting: Cardiology

## 2023-06-26 ENCOUNTER — Encounter: Payer: Self-pay | Admitting: Cardiology

## 2023-06-26 VITALS — BP 110/72 | HR 89 | Resp 16 | Ht 61.0 in | Wt 226.4 lb

## 2023-06-26 DIAGNOSIS — Z13220 Encounter for screening for lipoid disorders: Secondary | ICD-10-CM

## 2023-06-26 DIAGNOSIS — I4729 Other ventricular tachycardia: Secondary | ICD-10-CM | POA: Diagnosis not present

## 2023-06-26 LAB — LIPID PANEL
Chol/HDL Ratio: 4.2 ratio (ref 0.0–4.4)
Cholesterol, Total: 169 mg/dL (ref 100–199)
HDL: 40 mg/dL (ref >39)
LDL Chol Calc (NIH): 116 mg/dL — ABNORMAL HIGH (ref 0–99)
Triglycerides: 65 mg/dL (ref 0–149)
VLDL Cholesterol Cal: 13 mg/dL (ref 5–40)

## 2023-06-26 NOTE — Patient Instructions (Signed)
 Medication Instructions:   STOP TAKING METOPROLOL NOW  *If you need a refill on your cardiac medications before your next appointment, please call your pharmacy*  Lab Work:  TODAY--DOWNSTAIRS FIRST FLOOR AT [facility]  If you have labs (blood work) drawn today and your tests are completely normal, you will receive your results only by: MyChart Message (if you have MyChart) OR A paper copy in the mail If you have any lab test that is abnormal or we need to change your treatment, we will call you to review the results.    Follow-Up:  AS NEEDED WITH DR. PATWARDHAN         1st Floor: - Lobby - Registration  - Pharmacy  - Lab - Cafe  2nd Floor: - PV Lab - Diagnostic Testing (echo, CT, nuclear med)  3rd Floor: - Vacant  4th Floor: - TCTS (cardiothoracic surgery) - AFib Clinic - Structural Heart Clinic - Vascular Surgery  - Vascular Ultrasound  5th Floor: - [facility] Cardiology (general and EP) - Clinical Pharmacy for coumadin, hypertension, lipid, weight-loss medications, and med management appointments    Valet parking services will be available as well.

## 2023-06-26 NOTE — Progress Notes (Signed)
 Cardiology Office Note:  .   Date:  06/26/2023  ID:  Kerry Chavez, DOB [birth year], MRN [id number] PCP: Champ Mungo, DO  [facility] Providers Cardiologist:  Truett Mainland, MD PCP: Champ Mungo, DO  Chief Complaint  Patient presents with   Palpitations     Kerry Chavez is a 38 y.o. female with hypertension, h/o aneurysmal subarachnoid hemorrhage, anxiety, depression  Patient is doing well.  Anxiety has gotten better..  She is not having any chest pain symptoms.  She stopped taking metoprolol about 2 months ago, but has not had any significant palpitation symptoms.  She asks me if she could check her lipid panel.    Vitals:   06/26/23 0928  BP: 110/72  Pulse: 89  Resp: 16  SpO2: 98%      Review of Systems  Cardiovascular:  Negative for chest pain, dyspnea on exertion, leg swelling, palpitations and syncope.        Studies Reviewed: Marland Kitchen        EKG 06/26/2023: Normal sinus rhythm Normal ECG When compared with ECG of 08-Jul-2022 15:42, No significant change was found    Independently interpreted 09/2022: Hb 12.1 Cr 0.69   Mobile cardiac telemetry 13 days 07/23/2022 - 08/06/2022: Dominant rhythm: Sinus. HR 56-146 bpm. Avg HR 98 bpm, in sinus rhythm. 0 episodes of SVT. <1% isolated SVE, couplet, no triplets. 4 episodes of VT, fastest and longest at 194 bpm for 11 beats. <1% isolated VE, couplet, no triplets. No atrial fibrillation/atrial flutter/SVT/high grade AV block, sinus pause >3sec noted. 0 patient triggered events.    Regadenoson Nuclear stress test 10/01/2022: Breast attenuation noted in the inferior region. Cannot exclude small sized mild ischemia in the apical inferior and apical infero-septal region.  Overall LV systolic function is normal without regional wall motion abnormalities. Stress LV EF: 41%, but visually appears normal.  Nondiagnostic ECG stress. The heart rate response was consistent with Regadenoson.  No previous exam available  for comparison. Low risk.    Echocardiogram 08/2022:  Normal LV systolic function with visual EF 55-60%. Left ventricle cavity  is normal in size. Normal left ventricular wall thickness. Normal global  wall motion. Normal diastolic filling pattern, normal LAP.  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  Mild (Grade I) mitral regurgitation.  Mild tricuspid regurgitation. No evidence of pulmonary hypertension.  No prior study for comparison.   Exercise treadmill stress test 07/2022: Exercise treadmill stress test performed using Bruce protocol.  Patient exercised for 5 minutes and 1 seconds, reached 7 METS, and 77%% of age predicted maximum heart rate.  Exercise capacity was low.  No chest pain reported.  Heart rate response limited due to low exercise capacity. Normal blood pressure response. Stress EKG at 77% MPHR revealed no ischemic changes. Inconclusive exercise treadmill stress test due to inability to reach 85% MPHR.   Physical Exam Vitals and nursing note reviewed.  Constitutional:      General: She is not in acute distress. Neck:     Vascular: No JVD.  Cardiovascular:     Rate and Rhythm: Normal rate and regular rhythm.     Heart sounds: Normal heart sounds. No murmur heard. Pulmonary:     Effort: Pulmonary effort is normal.     Breath sounds: Normal breath sounds. No wheezing or rales.      VISIT DIAGNOSES:   ICD-10-CM   1. NSVT (nonsustained ventricular tachycardia) (HCC)  I47.29 EKG 12-Lead  2. Screening for lipid disorders  Z13.220 Lipid Profile    Lipid Profile       Kerry Chavez is a 38 y.o. female with hypertension, h/o aneurysmal subarachnoid hemorrhage, anxiety, depression  Doing well, no recurrence of chest pain and palpitation symptoms in spite of not taking metoprolol.  Okay to leave off.  Will check lipid panel for screening, at patient's request.  I will see her as needed.     Signed, Elder Negus, MD

## 2023-06-27 NOTE — Progress Notes (Signed)
 10-year ASCVD risk <1%.  Statin not a must. Continue heart healthy diet and lifestyle. If you want to be more aggressive with primary prevention, we can get calcium score scan for further risk stratification.  If calcium score 0, no statin needed.  If calcium score elevated, we could consider addition of statin.  Please let patient know that calcium score is not covered by insurance and would be out-of-pocket pay.  Thanks MJP

## 2023-07-02 ENCOUNTER — Telehealth: Payer: Self-pay | Admitting: Cardiology

## 2023-07-02 NOTE — Telephone Encounter (Signed)
 Patient returned RN's call regarding results.

## 2023-07-02 NOTE — Telephone Encounter (Signed)
 Spoke with patient and she is aware of lipid results. She will call back to discuss getting calcium score test.

## 2023-07-05 LAB — COMPREHENSIVE METABOLIC PANEL WITH GFR
ALT: 15 IU/L (ref 0–32)
AST: 15 IU/L (ref 0–40)
Albumin: 3.9 g/dL (ref 3.9–4.9)
Alkaline Phosphatase: 88 IU/L (ref 44–121)
BUN/Creatinine Ratio: 13 (ref 9–23)
BUN: 9 mg/dL (ref 6–20)
Bilirubin Total: <0.2 mg/dL (ref 0.0–1.2)
CO2: 22 mmol/L (ref 20–29)
Calcium: 8.7 mg/dL (ref 8.7–10.2)
Chloride: 105 mmol/L (ref 96–106)
Creatinine, Ser: 0.67 mg/dL (ref 0.57–1.00)
Globulin, Total: 2.7 g/dL (ref 1.5–4.5)
Glucose: 85 mg/dL (ref 70–99)
Potassium: 4.6 mmol/L (ref 3.5–5.2)
Sodium: 139 mmol/L (ref 134–144)
Total Protein: 6.6 g/dL (ref 6.0–8.5)
eGFR: 114 mL/min/{1.73_m2} (ref >59)

## 2023-07-05 LAB — CBC
Hematocrit: 31.3 % — ABNORMAL LOW (ref 34.0–46.6)
Hemoglobin: 9.6 g/dL — ABNORMAL LOW (ref 11.1–15.9)
MCH: 27.4 pg (ref 26.6–33.0)
MCHC: 30.7 g/dL — ABNORMAL LOW (ref 31.5–35.7)
MCV: 89 fL (ref 79–97)
Platelets: 393 10*3/uL (ref 150–450)
RBC: 3.51 x10E6/uL — ABNORMAL LOW (ref 3.77–5.28)
RDW: 15.7 % — ABNORMAL HIGH (ref 11.7–15.4)
WBC: 8.2 10*3/uL (ref 3.4–10.8)

## 2023-07-05 LAB — TSH+FREE T4
Free T4: 1.04 ng/dL (ref 0.82–1.77)
TSH: 1.15 u[IU]/mL (ref 0.450–4.500)

## 2023-07-05 LAB — VITAMIN D 1,25 DIHYDROXY
Vitamin D 1, 25 (OH)2 Total: 71 pg/mL — ABNORMAL HIGH
Vitamin D2 1, 25 (OH)2: <10 pg/mL
Vitamin D3 1, 25 (OH)2: 68 pg/mL

## 2023-07-05 LAB — VITAMIN B12: Vitamin B-12: 389 pg/mL (ref 232–1245)

## 2023-07-10 ENCOUNTER — Encounter: Payer: Self-pay | Admitting: Obstetrics & Gynecology

## 2023-12-10 ENCOUNTER — Other Ambulatory Visit: Payer: Self-pay | Admitting: Neurosurgery

## 2023-12-10 DIAGNOSIS — I609 Nontraumatic subarachnoid hemorrhage, unspecified: Secondary | ICD-10-CM

## 2024-01-10 ENCOUNTER — Other Ambulatory Visit: Payer: Self-pay | Admitting: Neurosurgery

## 2024-01-10 ENCOUNTER — Encounter (HOSPITAL_COMMUNITY): Payer: Self-pay | Admitting: *Deleted

## 2024-01-10 ENCOUNTER — Other Ambulatory Visit: Payer: Self-pay

## 2024-01-10 ENCOUNTER — Ambulatory Visit (HOSPITAL_COMMUNITY)
Admission: RE | Admit: 2024-01-10 | Discharge: 2024-01-10 | Disposition: A | Discharge status: Home / Self Care | Source: Ambulatory Visit | Attending: Neurosurgery | Admitting: Neurosurgery

## 2024-01-10 DIAGNOSIS — F419 Anxiety disorder, unspecified: Secondary | ICD-10-CM | POA: Diagnosis not present

## 2024-01-10 DIAGNOSIS — I609 Nontraumatic subarachnoid hemorrhage, unspecified: Secondary | ICD-10-CM

## 2024-01-10 DIAGNOSIS — I671 Cerebral aneurysm, nonruptured: Secondary | ICD-10-CM | POA: Insufficient documentation

## 2024-01-10 DIAGNOSIS — Z87891 Personal history of nicotine dependence: Secondary | ICD-10-CM | POA: Diagnosis not present

## 2024-01-10 HISTORY — PX: IR ANGIO VERTEBRAL SEL VERTEBRAL BILAT MOD SED: IMG5369

## 2024-01-10 HISTORY — PX: IR ANGIO INTRA EXTRACRAN SEL INTERNAL CAROTID BILAT MOD SED: IMG5363

## 2024-01-10 HISTORY — PX: IR US GUIDE VASC ACCESS RIGHT: IMG2390

## 2024-01-10 LAB — URINALYSIS, ROUTINE W REFLEX MICROSCOPIC
Bilirubin Urine: NEGATIVE
Glucose, UA: NEGATIVE mg/dL
Ketones, ur: NEGATIVE mg/dL
Leukocytes,Ua: NEGATIVE
Nitrite: NEGATIVE
Protein, ur: 30 mg/dL — AB
RBC / HPF: >50 RBC/hpf (ref 0–5)
Specific Gravity, Urine: 1.019 (ref 1.005–1.030)
pH: 6 (ref 5.0–8.0)

## 2024-01-10 LAB — CBC WITH DIFFERENTIAL/PLATELET
Abs Immature Granulocytes: 0.03 K/uL (ref 0.00–0.07)
Basophils Absolute: 0 K/uL (ref 0.0–0.1)
Basophils Relative: 0 %
Eosinophils Absolute: 0.1 K/uL (ref 0.0–0.5)
Eosinophils Relative: 2 %
HCT: 31.9 % — ABNORMAL LOW (ref 36.0–46.0)
Hemoglobin: 10.1 g/dL — ABNORMAL LOW (ref 12.0–15.0)
Immature Granulocytes: 0 %
Lymphocytes Relative: 16 %
Lymphs Abs: 1.5 K/uL (ref 0.7–4.0)
MCH: 26.8 pg (ref 26.0–34.0)
MCHC: 31.7 g/dL (ref 30.0–36.0)
MCV: 84.6 fL (ref 80.0–100.0)
Monocytes Absolute: 0.7 K/uL (ref 0.1–1.0)
Monocytes Relative: 7 %
Neutro Abs: 7 K/uL (ref 1.7–7.7)
Neutrophils Relative %: 75 %
Platelets: 296 K/uL (ref 150–400)
RBC: 3.77 MIL/uL — ABNORMAL LOW (ref 3.87–5.11)
RDW: 19.4 % — ABNORMAL HIGH (ref 11.5–15.5)
WBC: 9.4 K/uL (ref 4.0–10.5)
nRBC: 0 % (ref 0.0–0.2)

## 2024-01-10 LAB — BASIC METABOLIC PANEL WITH GFR
Anion gap: 8 (ref 5–15)
BUN: 10 mg/dL (ref 6–20)
CO2: 22 mmol/L (ref 22–32)
Calcium: 8.3 mg/dL — ABNORMAL LOW (ref 8.9–10.3)
Chloride: 107 mmol/L (ref 98–111)
Creatinine, Ser: 0.78 mg/dL (ref 0.44–1.00)
GFR, Estimated: >60 mL/min (ref >60)
Glucose, Bld: 92 mg/dL (ref 70–99)
Potassium: 4.7 mmol/L (ref 3.5–5.1)
Sodium: 137 mmol/L (ref 135–145)

## 2024-01-10 LAB — PREGNANCY, URINE: Preg Test, Ur: NEGATIVE

## 2024-01-10 MED ORDER — MIDAZOLAM HCL (PF) 2 MG/2ML IJ SOLN
INTRAMUSCULAR | Status: AC | PRN
  Administered 2024-01-10: 1 mg via INTRAVENOUS

## 2024-01-10 MED ORDER — DEXAMETHASONE SOD PHOSPHATE PF 10 MG/ML IJ SOLN
INTRAMUSCULAR | Status: AC | PRN
  Administered 2024-01-10: 10 mg via INTRAVENOUS

## 2024-01-10 MED ORDER — HEPARIN SODIUM (PORCINE) 1000 UNIT/ML IJ SOLN
INTRAMUSCULAR | Status: AC
  Filled 2024-01-10: qty 10

## 2024-01-10 MED ORDER — FENTANYL CITRATE (PF) 100 MCG/2ML IJ SOLN
INTRAMUSCULAR | Status: AC
  Filled 2024-01-10: qty 2

## 2024-01-10 MED ORDER — MIDAZOLAM HCL 2 MG/2ML IJ SOLN
INTRAMUSCULAR | Status: AC
  Filled 2024-01-10: qty 2

## 2024-01-10 MED ORDER — IOHEXOL 300 MG/ML  SOLN: 100.0000 mL | Freq: Once | INTRAMUSCULAR | Status: DC | PRN

## 2024-01-10 MED ORDER — SODIUM CHLORIDE 0.9 % IV SOLN: INTRAVENOUS | Status: DC

## 2024-01-10 MED ORDER — HEPARIN SODIUM (PORCINE) 1000 UNIT/ML IJ SOLN
INTRAMUSCULAR | Status: AC | PRN
  Administered 2024-01-10: 2000 [IU] via INTRAVENOUS

## 2024-01-10 MED ORDER — LIDOCAINE HCL 1 % IJ SOLN
INTRAMUSCULAR | Status: AC
  Filled 2024-01-10: qty 20

## 2024-01-10 MED ORDER — VANCOMYCIN HCL IN DEXTROSE 1-5 GM/200ML-% IV SOLN: 1000.0000 mg | INTRAVENOUS | Status: DC

## 2024-01-10 MED ORDER — FENTANYL CITRATE (PF) 100 MCG/2ML IJ SOLN
INTRAMUSCULAR | Status: AC | PRN
  Administered 2024-01-10: 25 ug via INTRAVENOUS

## 2024-01-10 MED ORDER — HYDROCODONE-ACETAMINOPHEN 5-325 MG PO TABS
1.0000 | ORAL_TABLET | ORAL | Status: DC | PRN
  Administered 2024-01-10: 2 via ORAL
  Filled 2024-01-10: qty 2

## 2024-01-10 NOTE — Op Note (Signed)
 ENDOVASCULAR NEUROSURGERY OPERATIVE NOTE   PROCEDURE: Diagnostic Cerebral Angiogram   SURGEON:   Dr. Gerldine Maizes, MD  HISTORY:   The patient is a 38 y.o. 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  She has done well from a neurologic standpoint and comes in today for routine long-term angiographic follow-up.  APPROACH:   The technical aspects of the procedure as well as its potential risks and benefits were reviewed with the patient. These risks included but were not limited bleeding, infection, allergic reaction, damage to organs/vital structures, stroke, non-diagnostic procedure, and the catastrophic outcomes of heart attack, coma, and death. With an understanding of these risks, informed consent was obtained and witnessed.    The patient was placed in the supine position on the angiography table and the skin of right groin prepped in the usual sterile fashion. The procedure was performed under local anesthesia (1%-solution of bicarbonate-bufferred Lidoacaine) and conscious sedation with Versed  and fentanyl  monitored by the in-suite nurse and myself, including non-invasive blood pressure and continuous pulse oxymetry.    Access to the right common femoral artery was obtained using ultrasound guidance in order to directly visualize the micropuncture needle into the lumen of the right common femoral artery.  A short 5 French glide sheath was then introduced using standard Seldinger technique.  HEPARIN :  2000 Units total.    CONTRAST AGENT:  See IR records   FLUOROSCOPY TIME:  See IR records    CATHETER(S) AND WIRE(S):    5-French JB-1 glidecatheter   0.035" glidewire    VESSELS CATHETERIZED:   Right internal carotid   Left internal carotid   Right vertebral   Left vertebral   Right common femoral  VESSELS STUDIED:   Right internal carotid,  head Left internal carotid, head Left vertebral Right vertebral Right femoral  PROCEDURAL NARRATIVE:   A 5-Fr JB-1 terumo glide catheter was advanced over a 0.035 glidewire into the aortic arch. The above vessels were then sequentially catheterized and cervical/cerebral angiograms taken. After review of images, the catheter was removed without incident.    INTERPRETATION:   Right internal carotid, head:   Injection reveals the presence of a widely patent ICA, M1, and A1 segments and their branches.  Coil mass is seen in the region of the posterior communicating artery.  There appears to have been neck recanalization of the aneurysm which on previous angiogram was felt to represent the origin of the posterior communicating artery however in hindsight likely represented a small neck residual.  The current study demonstrates neck recanalization now measuring approximately 5 mm wide and approximately 4 mm tall.  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  Overall morphology remains relatively smooth.  The parenchymal and venous phases are unremarkable. The venous sinuses are widely patent.    Left internal carotid, head:   Injection reveals the presence of a widely patent ICA, A1, and M1 segments and their branches.  Now visualized is an irregular almost trilobed appearance of an ophthalmic aneurysm projecting superiorly and laterally measuring approximately 5 mm wide and 3 mm tall.  The parenchymal and venous phases are unremarkable. The venous sinuses are widely patent.    Left vertebral:   Injection reveals the presence of a widely patent vertebral artery. This leads to a widely patent basilar artery that terminates in bilateral P1. The basilar apex is normal.  No aneurysms, arteriovenous malformations, or high flow fistulas are visualized. The parenchymal and venous phases are normal. The venous sinuses are patent.    Right vertebral:     Normal vessel. No PICA aneurysm. See basilar description above.    Right femoral:    Normal vessel. No significant atherosclerotic disease. Arterial sheath in adequate position.   DISPOSITION:  Upon completion of the study, the femoral sheath was removed and hemostasis obtained using a 5-Fr exoseal closure device. Good proximal and distal lower extremity pulses were documented upon achievement of hemostasis. The procedure was well tolerated and no early complications were observed.  The patient was transferred to the recovery area to be positioned flat in bed for 3 hours.    IMPRESSION:  1.  Development of an irregular appearing, trilobed left ophthalmic aneurysm as described above 2.  Enlargement of previously described right ophthalmic aneurysm now measuring over 3 mm. 3.  Enlargement of a neck recanalization from the previously coiled right posterior communicating artery aneurysm, as described above.    Gerldine Maizes, MD [facility]

## 2024-01-10 NOTE — Progress Notes (Signed)
 Client c/o headache and states when it started had visual disturbances of seeing squiggles; states that went away and 6/10 headache; PEARL: grips equal and strong, and pedal pushes equal and strong

## 2024-01-10 NOTE — H&P (Addendum)
 HPI:     Kerry Chavez is a 38 year old woman status post subarachnoid hemorrhage and coil embolization of a right posterior communicating artery aneurysm roughly two years ago.  Patient has done very well from a neurologic standpoint.  She underwent routine short-term angiographic follow-up which did not demonstrate any evidence of aneurysm recanalization.  She does also have a very small incidentally skiing right ophthalmic aneurysm.  She suffers from significant anxiety but since our last visit has joined a subarachnoid hemorrhage support group and has been seen a therapist and appears to be doing much better.    Patient Active Problem List   Diagnosis Date Noted   Skin tag 12/03/2022   Menorrhagia 11/08/2022   Visit for routine gyn exam 11/08/2022   Possible exposure to STD 11/08/2022   Current severe episode of major depressive disorder without psychotic features without prior episode (HCC) 10/16/2022   Restless leg syndrome 09/04/2022   NSVT (nonsustained ventricular tachycardia) (HCC) 09/03/2022   Precordial pain 07/24/2022   Abnormal uterine bleeding (AUB) 06/04/2022   Healthcare maintenance 06/04/2022   Headache 05/06/2022   Iron deficiency 02/27/2022   Palpitations 12/13/2020   Anxiety 12/13/2020   Vitamin D  deficiency 06/23/2020   Morbid obesity with BMI of 40.0-44.9, adult (HCC) 11/10/2007   Past Medical History:  Diagnosis Date   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HCC) 11/2021   Anxiety    Asthma    Bleeding external hemorrhoids 06/04/2022   Depression    NSVT (nonsustained ventricular tachycardia) (HCC) 09/03/2022    Past Surgical History:  Procedure Laterality Date   CESAREAN SECTION     CESAREAN SECTION WITH BILATERAL TUBAL LIGATION Bilateral 06/12/2014   Procedure: Repeat CESAREAN SECTION WITH BILATERAL TUBAL LIGATION;  Surgeon: Dickie Carder, MD;  Location: [facility];  Service: Obstetrics;  Laterality: Bilateral;  EDD: 06/19/14 Wants clear C-Section drape  for this case   IR ANGIO INTRA EXTRACRAN SEL INTERNAL CAROTID UNI L MOD SED  12/21/2021   IR ANGIO INTRA EXTRACRAN SEL INTERNAL CAROTID UNI R MOD SED  12/21/2021   IR ANGIO INTRA EXTRACRAN SEL INTERNAL CAROTID UNI R MOD SED  06/26/2022   IR ANGIO VERTEBRAL SEL VERTEBRAL BILAT MOD SED  12/21/2021   IR ANGIOGRAM FOLLOW UP STUDY  12/21/2021   IR ANGIOGRAM FOLLOW UP STUDY  12/21/2021   IR TRANSCATH/EMBOLIZ  12/21/2021   IR US  GUIDE VASC ACCESS RIGHT  06/26/2022   RADIOLOGY WITH ANESTHESIA N/A 12/21/2021   Procedure: IR WITH ANESTHESIA;  Surgeon: Lanis Pupa, MD;  Location: [facility] OR;  Service: Radiology;  Laterality: N/A;   TONSILLECTOMY AND ADENOIDECTOMY  1998    (Not in a hospital admission)  Allergies  Allergen Reactions   Shellfish Allergy Swelling   Ivp Dye [Iodinated Contrast Media] Other (See Comments)    Unknown reaction   Misc. Sulfonamide Containing Compounds    Other    Penicillins Hives, Itching and Swelling    REACTION: rash   Sulfamethoxazole Hives and Swelling    REACTION: rash   Latex Itching and Rash    With condoms only    Social History   Tobacco Use   Smoking status: Former    Current packs/day: 0.00    Average packs/day: 0.3 packs/day for 10.0 years (2.5 ttl pk-yrs)    Types: Cigarettes    Start date: 12/21/2011    Quit date: 12/20/2021    Years since quitting: 2.0    Passive exposure: Past   Smokeless tobacco: Never  Substance Use Topics  Alcohol use: Yes    Comment: Socially    Family History  Problem Relation Age of Onset   Diabetes Mother    Cerebral aneurysm Father    CVA Father    Kidney disease Brother    Breast cancer Paternal Aunt      Objective:   Patient Vitals for the past 8 hrs:  BP Temp Temp src Pulse Resp SpO2 Height Weight  01/10/24 0708 (!) 146/94 97.9 F (36.6 C) Oral 87 18 100 % 5' 2 (1.575 m) 97.1 kg     Assessment:   38 year old woman two years status post subarachnoid hemorrhage and coil embolization of a right posterior  communicating artery aneurysm.  Overall she is doing quite well.  She does have a small right ophthalmic aneurysm as well.  Awake, alert, oriented Speech fluent, appropriate CN grossly intact 5/5 BUE/BLE    Plan:   - we will plan on routine angiographic follow-up today.  -I have reviewed the details of the procedure with the patient both in the office and in preop today. We have discussed the expected postoperative course and recovery. We have also reviewed the risks of the procedure to include stroke, arterial dissection, contrast reaction, and access site bleeding. All her questions today were answered and she provided informed consent to proceed.   Kerry Maizes, MD [facility]

## 2024-01-10 NOTE — Progress Notes (Signed)
 Vomited yellow liquid and states last time took hydrocodone  vomited; states relief from nausea and no headache now

## 2024-01-10 NOTE — Progress Notes (Signed)
 Up and walked to bathroom and tolerated well; right groin stable, no bleeding or hematoma; states slight nausea and given coke and crackers; Dr Lanis notified of c/o headache and nausea and ok to d/c home

## 2024-01-13 ENCOUNTER — Encounter (HOSPITAL_COMMUNITY): Payer: Self-pay

## 2024-01-20 DIAGNOSIS — I671 Cerebral aneurysm, nonruptured: Secondary | ICD-10-CM | POA: Diagnosis not present

## 2024-01-20 DIAGNOSIS — I609 Nontraumatic subarachnoid hemorrhage, unspecified: Secondary | ICD-10-CM | POA: Diagnosis not present

## 2024-01-22 ENCOUNTER — Other Ambulatory Visit (HOSPITAL_COMMUNITY): Payer: Self-pay | Admitting: Neurosurgery

## 2024-01-22 ENCOUNTER — Other Ambulatory Visit: Payer: Self-pay | Admitting: Neurosurgery

## 2024-01-22 DIAGNOSIS — I671 Cerebral aneurysm, nonruptured: Secondary | ICD-10-CM

## 2024-02-10 NOTE — Progress Notes (Signed)
 Surgical Instructions   Your procedure is scheduled on Friday, November 21st, 2025. Report to [facility] Main Entrance A at 9:30 A.M., then check in with the Admitting office. Any questions or running late day of surgery: call [phone]  Questions prior to your surgery date: call [phone], Monday-Friday, 8am-4pm. If you experience any cold or flu symptoms such as cough, fever, chills, shortness of breath, etc. between now and your scheduled surgery, please notify us  at the above number.     Remember:  Do not eat or drink after midnight the night before your surgery     Take these medicines the morning of surgery with A SIP OF WATER: Aspirin Escitalopram  (Lexapro ) Ticagrelor (Brilinta)   May take these medicines IF NEEDED: None.     One week prior to surgery, STOP taking any Aleve, Naproxen, Ibuprofen , Motrin , Advil , Goody's, BC's, all herbal medications, fish oil, and non-prescription vitamins.                     Do NOT Smoke (Tobacco/Vaping) for 24 hours prior to your procedure.  If you use a CPAP at night, you may bring your mask/headgear for your overnight stay.   You will be asked to remove any contacts, glasses, piercing's, hearing aid's, dentures/partials prior to surgery. Please bring cases for these items if needed.    Patients discharged the day of surgery will not be allowed to drive home, and someone needs to stay with them for 24 hours.  SURGICAL WAITING ROOM VISITATION Patients may have no more than 2 support people in the waiting area - these visitors may rotate.   Pre-op nurse will coordinate an appropriate time for 1 ADULT support person, who may not rotate, to accompany patient in pre-op.  Children under the age of 51 must have an adult with them who is not the patient and must remain in the main waiting area with an adult.  If the patient needs to stay at the hospital during part of their recovery, the visitor guidelines for inpatient rooms  apply.  Please refer to the [facility] website for the visitor guidelines for any additional information.   If you received a COVID test during your pre-op visit  it is requested that you wear a mask when out in public, stay away from anyone that may not be feeling well and notify your surgeon if you develop symptoms. If you have been in contact with anyone that has tested positive in the last 10 days please notify you surgeon.      Pre-operative CHG Bathing Instructions   You can play a key role in reducing the risk of infection after surgery. Your skin needs to be as free of germs as possible. You can reduce the number of germs on your skin by washing with CHG (chlorhexidine  gluconate) soap before surgery. CHG is an antiseptic soap that kills germs and continues to kill germs even after washing.   DO NOT use if you have an allergy to chlorhexidine /CHG or antibacterial soaps. If your skin becomes reddened or irritated, stop using the CHG and notify one of our RNs at [phone].              TAKE A SHOWER THE NIGHT BEFORE SURGERY   Please keep in mind the following:  DO NOT shave, including legs and underarms, 48 hours prior to surgery.   You may shave your face before/day of surgery.  Place clean sheets on your bed the night before  surgery Use a clean washcloth (not used since being washed) for shower. DO NOT sleep with pet's night before surgery.  CHG Shower Instructions:  Wash your face and private area with normal soap. If you choose to wash your hair, wash first with your normal shampoo.  After you use shampoo/soap, rinse your hair and body thoroughly to remove shampoo/soap residue.  Turn the water OFF and apply half the bottle of CHG soap to a CLEAN washcloth.  Apply CHG soap ONLY FROM YOUR NECK DOWN TO YOUR TOES (washing for 3-5 minutes)  DO NOT use CHG soap on face, private areas, open wounds, or sores.  Pay special attention to the area where your surgery is being  performed.  If you are having back surgery, having someone wash your back for you may be helpful. Wait 2 minutes after CHG soap is applied, then you may rinse off the CHG soap.  Pat dry with a clean towel  Put on clean pajamas    Additional instructions for the day of surgery: If you choose, you may shower the morning of surgery with an antibacterial soap.  DO NOT APPLY any lotions, deodorants, cologne, or perfumes.   Do not wear jewelry or makeup Do not wear nail polish, gel polish, artificial nails, or any other type of covering on natural nails (fingers and toes) Do not bring valuables to the hospital. [facility] is not responsible for valuables/personal belongings. Put on clean/comfortable clothes.  Please brush your teeth.  Ask your nurse before applying any prescription medications to the skin.

## 2024-02-11 ENCOUNTER — Encounter (HOSPITAL_COMMUNITY)
Admission: RE | Admit: 2024-02-11 | Discharge: 2024-02-11 | Disposition: A | Discharge status: Home / Self Care | Source: Ambulatory Visit | Attending: Neurosurgery | Admitting: Neurosurgery

## 2024-02-11 ENCOUNTER — Encounter (HOSPITAL_COMMUNITY): Payer: Self-pay

## 2024-02-11 ENCOUNTER — Other Ambulatory Visit: Payer: Self-pay

## 2024-02-11 VITALS — BP 126/85 | HR 102 | Temp 98.5°F | Resp 17 | Ht 61.0 in | Wt 211.9 lb

## 2024-02-11 DIAGNOSIS — I10 Essential (primary) hypertension: Secondary | ICD-10-CM | POA: Insufficient documentation

## 2024-02-11 DIAGNOSIS — Z01812 Encounter for preprocedural laboratory examination: Secondary | ICD-10-CM | POA: Insufficient documentation

## 2024-02-11 DIAGNOSIS — D649 Anemia, unspecified: Secondary | ICD-10-CM | POA: Diagnosis not present

## 2024-02-11 DIAGNOSIS — Z01818 Encounter for other preprocedural examination: Secondary | ICD-10-CM

## 2024-02-11 HISTORY — DX: Anemia, unspecified: D64.9

## 2024-02-11 LAB — URINALYSIS, ROUTINE W REFLEX MICROSCOPIC
Bacteria, UA: NONE SEEN
Bilirubin Urine: NEGATIVE
Glucose, UA: NEGATIVE mg/dL
Ketones, ur: 5 mg/dL — AB
Leukocytes,Ua: NEGATIVE
Nitrite: NEGATIVE
Protein, ur: NEGATIVE mg/dL
Specific Gravity, Urine: 1.023 (ref 1.005–1.030)
pH: 5 (ref 5.0–8.0)

## 2024-02-11 LAB — CBC WITH DIFFERENTIAL/PLATELET
Abs Immature Granulocytes: 0.05 K/uL (ref 0.00–0.07)
Basophils Absolute: 0 K/uL (ref 0.0–0.1)
Basophils Relative: 0 %
Eosinophils Absolute: 0.1 K/uL (ref 0.0–0.5)
Eosinophils Relative: 1 %
HCT: 33.7 % — ABNORMAL LOW (ref 36.0–46.0)
Hemoglobin: 10.3 g/dL — ABNORMAL LOW (ref 12.0–15.0)
Immature Granulocytes: 0 %
Lymphocytes Relative: 13 %
Lymphs Abs: 1.6 K/uL (ref 0.7–4.0)
MCH: 25.9 pg — ABNORMAL LOW (ref 26.0–34.0)
MCHC: 30.6 g/dL (ref 30.0–36.0)
MCV: 84.9 fL (ref 80.0–100.0)
Monocytes Absolute: 0.6 K/uL (ref 0.1–1.0)
Monocytes Relative: 5 %
Neutro Abs: 9.7 K/uL — ABNORMAL HIGH (ref 1.7–7.7)
Neutrophils Relative %: 81 %
Platelets: 372 K/uL (ref 150–400)
RBC: 3.97 MIL/uL (ref 3.87–5.11)
RDW: 19.4 % — ABNORMAL HIGH (ref 11.5–15.5)
WBC: 12 K/uL — ABNORMAL HIGH (ref 4.0–10.5)
nRBC: 0 % (ref 0.0–0.2)

## 2024-02-11 LAB — BASIC METABOLIC PANEL WITH GFR
Anion gap: 9 (ref 5–15)
BUN: 7 mg/dL (ref 6–20)
CO2: 23 mmol/L (ref 22–32)
Calcium: 8.7 mg/dL — ABNORMAL LOW (ref 8.9–10.3)
Chloride: 106 mmol/L (ref 98–111)
Creatinine, Ser: 0.74 mg/dL (ref 0.44–1.00)
GFR, Estimated: >60 mL/min (ref >60)
Glucose, Bld: 105 mg/dL — ABNORMAL HIGH (ref 70–99)
Potassium: 3.4 mmol/L — ABNORMAL LOW (ref 3.5–5.1)
Sodium: 138 mmol/L (ref 135–145)

## 2024-02-11 LAB — PROTIME-INR
INR: 1 (ref 0.8–1.2)
Prothrombin Time: 14.2 s (ref 11.4–15.2)

## 2024-02-11 LAB — APTT: aPTT: 30 s (ref 24–36)

## 2024-02-11 NOTE — Progress Notes (Signed)
 PCP - patient states that she does not have a PCP at this time but goes to [facility]  Cardiologist - Dr. Newman Lawrence - LOV - 06/26/23  PPM/ICD - denies Device Orders - n/a Rep Notified - n/a  Chest x-ray - denies EKG - 06/26/23 Stress Test - 10/01/22 ECHO - 08/28/22 Cardiac Cath -denies   Sleep Study - denies   No DM  Last dose of GLP1 agonist-  n/a GLP1 instructions:  n/a  Blood Thinner Instructions: patient instructed to continue Brilinta including DOS Aspirin Instructions: patient instructed to contine Aspirin including DOS  ERAS Protcol - NPO PRE-SURGERY Ensure or G2- n/a  COVID TEST- n/a   Anesthesia review:  yes - NSVT  Patient denies shortness of breath, fever, cough and chest pain at PAT appointment   All instructions explained to the patient, with a verbal understanding of the material. Patient agrees to go over the instructions while at home for a better understanding. Patient also instructed to self quarantine after being tested for COVID-19. The opportunity to ask questions was provided.

## 2024-02-12 NOTE — Anesthesia Preprocedure Evaluation (Addendum)
 Anesthesia Evaluation  Patient identified by MRN, date of birth, ID band Patient awake    Reviewed: Allergy & Precautions, NPO status , Patient's Chart, lab work & pertinent test results  History of Anesthesia Complications Negative for: history of anesthetic complications  Airway Mallampati: II  TM Distance: >3 FB Neck ROM: Full    Dental  (+) Dental Advisory Given, Poor Dentition   Pulmonary asthma , former smoker   Pulmonary exam normal breath sounds clear to auscultation       Cardiovascular Normal cardiovascular exam+ dysrhythmias Ventricular Tachycardia  Rhythm:Regular Rate:Normal   '24 TTE - EF 55-60%. 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 Mild MR and TR.    Neuro/Psych  Headaches PSYCHIATRIC DISORDERS Anxiety Depression     SAH     GI/Hepatic negative GI ROS, Neg liver ROS,,,  Endo/Other    Class 3 obesity  Renal/GU negative Renal ROS     Musculoskeletal negative musculoskeletal ROS (+)    Abdominal   Peds  Hematology  (+) Blood dyscrasia, anemia  On brilinta     Anesthesia Other Findings   Reproductive/Obstetrics                              Anesthesia Physical Anesthesia Plan  ASA: 3  Anesthesia Plan: General   Post-op Pain Management: Tylenol  PO (pre-op)* and Minimal or no pain anticipated   Induction: Intravenous  PONV Risk Score and Plan: 3 and Treatment may vary due to age or medical condition, Ondansetron , Dexamethasone  and Midazolam   Airway Management Planned: Oral ETT  Additional Equipment: Arterial line  Intra-op Plan:   Post-operative Plan: Extubation in OR  Informed Consent: I have reviewed the patients History and Physical, chart, labs and discussed the procedure including the risks, benefits and alternatives for the proposed anesthesia with the patient or authorized representative who has indicated  his/her understanding and acceptance.     Dental advisory given  Plan Discussed with: CRNA and Anesthesiologist  Anesthesia Plan Comments: (PAT note by Lynwood Hope, PA-C)         Anesthesia Quick Evaluation

## 2024-02-12 NOTE — Progress Notes (Signed)
 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  Patient was evaluated by cardiology in 2024 for report of chest pain and palpitations.  Workup was benign.  Event monitor 06/2022 showed underlying rhythm to be sinus, 4 episodes of VT, fastest and longest at 194 bpm for 11 beats.  Exercise treadmill stress test 07/2022 negative for ischemia but inconclusive due to inability to reach target heart rate.  Echo 08/2022 EF 55 to 60%, mild-mod regurgitation, mild tricuspid regurgitation.  Nuclear stress test 09/2022 low risk.  Last seen in follow-up by Dr. Elmira 06/26/2023 and noted to be doing well at that time, she had discontinued metoprolol  without recurrence of chest pain or palpitations.  Recommend follow-up as needed.  Cerebral angiogram 01/10/2024 showed development of an irregular appearing, trilobed left ophthalmic artery aneurysm, enlargement of previously described right without aneurysm, enlargement of a neck recanalization from the previously coiled right posterior communicating artery aneurysm.  Preop labs reviewed, mild anemia hemoglobin 10.3, otherwise unremarkable.  EKG 06/26/23: NSR.  Rate 76.  Mobile cardiac telemetry 13 days 07/23/2022 - 08/06/2022: Dominant rhythm: Sinus. HR 56-146 bpm. Avg HR 98 bpm, in sinus rhythm. 0 episodes of SVT. <1% isolated SVE, couplet, no triplets. 4 episodes of VT, fastest and longest at 194 bpm for 11 beats. <1% isolated VE, couplet, no triplets. No atrial fibrillation/atrial flutter/SVT/high grade AV block, sinus pause >3sec noted. 0 patient triggered events.    Regadenoson  Nuclear stress test 10/01/2022: Breast attenuation noted in the inferior region. Cannot exclude small sized mild ischemia in the apical inferior and apical infero-septal region.  Overall LV systolic function is normal  without regional wall motion abnormalities. Stress LV EF: 41%, but visually appears normal.  Nondiagnostic ECG stress. The heart rate response was consistent with Regadenoson .  No previous exam available for comparison. Low risk.    Echocardiogram 08/2022:  Normal LV systolic function with visual EF 55-60%. Left ventricle cavity  is normal in size. Normal left ventricular wall thickness. Normal global  wall motion. Normal diastolic filling pattern, normal LAP.  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  Mild (Grade I) mitral regurgitation.  Mild tricuspid regurgitation. No evidence of pulmonary hypertension.  No prior study for comparison.    Exercise treadmill stress test 07/2022: Exercise treadmill stress test performed using Bruce protocol.  Patient exercised for 5 minutes and 1 seconds, reached 7 METS, and 77%% of age predicted maximum heart rate.  Exercise capacity was low.  No chest pain reported.  Heart rate response limited due to low exercise capacity. Normal blood pressure response. Stress EKG at 77% MPHR revealed no ischemic changes. Inconclusive exercise treadmill stress test due to inability to reach 85% MPHR.    Lynwood Geofm RIGGERS [facility] Short Stay Center/Anesthesiology Phone [phone] 02/12/2024 3:52 PM

## 2024-02-13 ENCOUNTER — Other Ambulatory Visit: Payer: Self-pay | Admitting: Radiology

## 2024-02-14 ENCOUNTER — Encounter (HOSPITAL_COMMUNITY)
Admission: RE | Disposition: A | Discharge status: Home / Self Care | Payer: Self-pay | Source: Home / Self Care | Attending: Neurosurgery

## 2024-02-14 ENCOUNTER — Ambulatory Visit (HOSPITAL_COMMUNITY): Admitting: Certified Registered Nurse Anesthetist

## 2024-02-14 ENCOUNTER — Inpatient Hospital Stay (HOSPITAL_COMMUNITY)
Admission: RE | Admit: 2024-02-14 | Discharge: 2024-02-14 | Disposition: A | Discharge status: Home / Self Care | Source: Ambulatory Visit | Attending: Neurosurgery | Admitting: Neurosurgery

## 2024-02-14 ENCOUNTER — Ambulatory Visit (HOSPITAL_COMMUNITY)
Admission: RE | Admit: 2024-02-14 | Discharge: 2024-02-15 | Disposition: A | Discharge status: Home / Self Care | Attending: Neurosurgery | Admitting: Neurosurgery

## 2024-02-14 ENCOUNTER — Ambulatory Visit (HOSPITAL_COMMUNITY): Payer: Self-pay | Admitting: Physician Assistant

## 2024-02-14 DIAGNOSIS — I671 Cerebral aneurysm, nonruptured: Principal | ICD-10-CM

## 2024-02-14 DIAGNOSIS — E66813 Obesity, class 3: Secondary | ICD-10-CM | POA: Insufficient documentation

## 2024-02-14 DIAGNOSIS — F32A Depression, unspecified: Secondary | ICD-10-CM | POA: Insufficient documentation

## 2024-02-14 DIAGNOSIS — Z01818 Encounter for other preprocedural examination: Secondary | ICD-10-CM

## 2024-02-14 DIAGNOSIS — Z87891 Personal history of nicotine dependence: Secondary | ICD-10-CM | POA: Diagnosis not present

## 2024-02-14 DIAGNOSIS — F419 Anxiety disorder, unspecified: Secondary | ICD-10-CM | POA: Diagnosis not present

## 2024-02-14 DIAGNOSIS — Z8679 Personal history of other diseases of the circulatory system: Secondary | ICD-10-CM | POA: Insufficient documentation

## 2024-02-14 DIAGNOSIS — Z6839 Body mass index (BMI) 39.0-39.9, adult: Secondary | ICD-10-CM | POA: Diagnosis not present

## 2024-02-14 DIAGNOSIS — I6522 Occlusion and stenosis of left carotid artery: Secondary | ICD-10-CM

## 2024-02-14 DIAGNOSIS — F418 Other specified anxiety disorders: Secondary | ICD-10-CM

## 2024-02-14 DIAGNOSIS — J45909 Unspecified asthma, uncomplicated: Secondary | ICD-10-CM

## 2024-02-14 HISTORY — PX: IR US GUIDE VASC ACCESS RIGHT: IMG2390

## 2024-02-14 HISTORY — PX: IR ANGIOGRAM FOLLOW UP STUDY: IMG697

## 2024-02-14 HISTORY — PX: IR ANGIO INTRA EXTRACRAN SEL INTERNAL CAROTID UNI L MOD SED: IMG5361

## 2024-02-14 HISTORY — PX: IR TRANSCATH/EMBOLIZ: IMG695

## 2024-02-14 HISTORY — PX: RADIOLOGY WITH ANESTHESIA: SHX6223

## 2024-02-14 LAB — POCT PREGNANCY, URINE: Preg Test, Ur: NEGATIVE

## 2024-02-14 LAB — MRSA NEXT GEN BY PCR, NASAL: MRSA by PCR Next Gen: NOT DETECTED

## 2024-02-14 MED ORDER — PANTOPRAZOLE SODIUM 40 MG IV SOLR
40.0000 mg | Freq: Every day | INTRAVENOUS | Status: DC
  Administered 2024-02-14: 40 mg via INTRAVENOUS
  Filled 2024-02-14: qty 10

## 2024-02-14 MED ORDER — MORPHINE SULFATE (PF) 2 MG/ML IV SOLN: 1.0000 mg | INTRAVENOUS | Status: DC | PRN

## 2024-02-14 MED ORDER — ORAL CARE MOUTH RINSE: 15.0000 mL | Freq: Once | OROMUCOSAL | Status: AC

## 2024-02-14 MED ORDER — ROCURONIUM BROMIDE 10 MG/ML (PF) SYRINGE
PREFILLED_SYRINGE | INTRAVENOUS | Status: DC | PRN
Start: 2024-02-14 — End: 2024-02-14
  Administered 2024-02-14: 50 mg via INTRAVENOUS
  Administered 2024-02-14: 20 mg via INTRAVENOUS

## 2024-02-14 MED ORDER — DEXAMETHASONE SOD PHOSPHATE PF 10 MG/ML IJ SOLN
INTRAMUSCULAR | Status: DC | PRN
  Administered 2024-02-14: 10 mg via INTRAVENOUS

## 2024-02-14 MED ORDER — ACETAMINOPHEN 500 MG PO TABS
1000.0000 mg | ORAL_TABLET | Freq: Once | ORAL | Status: AC
  Administered 2024-02-14: 1000 mg via ORAL
  Filled 2024-02-14: qty 2

## 2024-02-14 MED ORDER — LABETALOL HCL 5 MG/ML IV SOLN: 10.0000 mg | INTRAVENOUS | Status: DC | PRN

## 2024-02-14 MED ORDER — PHENYLEPHRINE 80 MCG/ML (10ML) SYRINGE FOR IV PUSH (FOR BLOOD PRESSURE SUPPORT)
PREFILLED_SYRINGE | INTRAVENOUS | Status: DC | PRN
Start: 2024-02-14 — End: 2024-02-14
  Administered 2024-02-14 (×3): 80 ug via INTRAVENOUS

## 2024-02-14 MED ORDER — TICAGRELOR 90 MG PO TABS
90.0000 mg | ORAL_TABLET | Freq: Two times a day (BID) | ORAL | Status: DC
  Administered 2024-02-14 – 2024-02-15 (×2): 90 mg via ORAL
  Filled 2024-02-14 (×2): qty 1

## 2024-02-14 MED ORDER — VANCOMYCIN HCL IN DEXTROSE 1-5 GM/200ML-% IV SOLN
INTRAVENOUS | Status: AC
Start: 2024-02-14 — End: 2024-02-14
  Administered 2024-02-14: 1000 mg via INTRAVENOUS
  Filled 2024-02-14: qty 200

## 2024-02-14 MED ORDER — FENTANYL CITRATE (PF) 250 MCG/5ML IJ SOLN
INTRAMUSCULAR | Status: DC | PRN
  Administered 2024-02-14 (×2): 50 ug via INTRAVENOUS

## 2024-02-14 MED ORDER — LACTATED RINGERS IV SOLN: INTRAVENOUS | Status: DC

## 2024-02-14 MED ORDER — CHLORHEXIDINE GLUCONATE CLOTH 2 % EX PADS
6.0000 | MEDICATED_PAD | Freq: Every day | CUTANEOUS | Status: DC
  Administered 2024-02-15: 6 via TOPICAL

## 2024-02-14 MED ORDER — LACTATED RINGERS IV SOLN: INTRAVENOUS | Status: DC | PRN

## 2024-02-14 MED ORDER — PROMETHAZINE HCL 25 MG PO TABS: 12.5000 mg | ORAL_TABLET | ORAL | Status: DC | PRN

## 2024-02-14 MED ORDER — ASPIRIN 81 MG PO CHEW
CHEWABLE_TABLET | ORAL | Status: AC
  Administered 2024-02-14: 81 mg via ORAL
  Filled 2024-02-14: qty 1

## 2024-02-14 MED ORDER — FENTANYL CITRATE (PF) 100 MCG/2ML IJ SOLN
INTRAMUSCULAR | Status: AC
  Filled 2024-02-14: qty 2

## 2024-02-14 MED ORDER — FENTANYL CITRATE (PF) 100 MCG/2ML IJ SOLN: 25.0000 ug | INTRAMUSCULAR | Status: DC | PRN

## 2024-02-14 MED ORDER — PHENYLEPHRINE HCL-NACL 20-0.9 MG/250ML-% IV SOLN
INTRAVENOUS | Status: DC | PRN
  Administered 2024-02-14: 40 ug/min via INTRAVENOUS

## 2024-02-14 MED ORDER — ESCITALOPRAM OXALATE 10 MG PO TABS
20.0000 mg | ORAL_TABLET | Freq: Every day | ORAL | Status: DC
  Administered 2024-02-14: 20 mg via ORAL
  Filled 2024-02-14: qty 2

## 2024-02-14 MED ORDER — CHLORHEXIDINE GLUCONATE CLOTH 2 % EX PADS: 6.0000 | MEDICATED_PAD | Freq: Once | CUTANEOUS | Status: DC

## 2024-02-14 MED ORDER — AMISULPRIDE (ANTIEMETIC) 5 MG/2ML IV SOLN: 10.0000 mg | Freq: Once | INTRAVENOUS | Status: DC | PRN

## 2024-02-14 MED ORDER — NALOXONE HCL 0.4 MG/ML IJ SOLN: 0.0800 mg | INTRAMUSCULAR | Status: DC | PRN

## 2024-02-14 MED ORDER — EPHEDRINE SULFATE-NACL 50-0.9 MG/10ML-% IV SOSY
PREFILLED_SYRINGE | INTRAVENOUS | Status: DC | PRN
Start: 2024-02-14 — End: 2024-02-14
  Administered 2024-02-14 (×2): 5 mg via INTRAVENOUS

## 2024-02-14 MED ORDER — HYDROCODONE-ACETAMINOPHEN 5-325 MG PO TABS
1.0000 | ORAL_TABLET | ORAL | Status: DC | PRN
  Administered 2024-02-14 – 2024-02-15 (×3): 1 via ORAL
  Filled 2024-02-14 (×3): qty 1

## 2024-02-14 MED ORDER — VANCOMYCIN HCL IN DEXTROSE 1-5 GM/200ML-% IV SOLN: 1000.0000 mg | INTRAVENOUS | Status: AC

## 2024-02-14 MED ORDER — ONDANSETRON HCL 4 MG/2ML IJ SOLN: 4.0000 mg | INTRAMUSCULAR | Status: DC | PRN

## 2024-02-14 MED ORDER — HEPARIN SODIUM (PORCINE) 1000 UNIT/ML IJ SOLN
INTRAMUSCULAR | Status: DC | PRN
  Administered 2024-02-14: 5000 [IU] via INTRAVENOUS

## 2024-02-14 MED ORDER — PROPOFOL 10 MG/ML IV BOLUS
INTRAVENOUS | Status: DC | PRN
  Administered 2024-02-14: 200 mg via INTRAVENOUS

## 2024-02-14 MED ORDER — DIPHENHYDRAMINE HCL 50 MG/ML IJ SOLN
INTRAMUSCULAR | Status: DC | PRN
  Administered 2024-02-14: 50 mg via INTRAVENOUS

## 2024-02-14 MED ORDER — DIPHENHYDRAMINE HCL 50 MG/ML IJ SOLN
INTRAMUSCULAR | Status: AC
  Filled 2024-02-14: qty 1

## 2024-02-14 MED ORDER — ORAL CARE MOUTH RINSE: 15.0000 mL | OROMUCOSAL | Status: DC | PRN

## 2024-02-14 MED ORDER — ONDANSETRON HCL 4 MG PO TABS
4.0000 mg | ORAL_TABLET | ORAL | Status: DC | PRN
  Administered 2024-02-15: 4 mg via ORAL
  Filled 2024-02-14: qty 1

## 2024-02-14 MED ORDER — SUGAMMADEX SODIUM 200 MG/2ML IV SOLN
INTRAVENOUS | Status: DC | PRN
Start: 2024-02-14 — End: 2024-02-14
  Administered 2024-02-14: 400 mg via INTRAVENOUS

## 2024-02-14 MED ORDER — CHLORHEXIDINE GLUCONATE 0.12 % MT SOLN: 15.0000 mL | Freq: Once | OROMUCOSAL | Status: AC

## 2024-02-14 MED ORDER — ONDANSETRON HCL 4 MG/2ML IJ SOLN
INTRAMUSCULAR | Status: DC | PRN
  Administered 2024-02-14: 4 mg via INTRAVENOUS

## 2024-02-14 MED ORDER — ASPIRIN 81 MG PO CHEW: 81.0000 mg | CHEWABLE_TABLET | Freq: Once | ORAL | Status: AC

## 2024-02-14 MED ORDER — IOHEXOL 300 MG/ML  SOLN: 150.0000 mL | Freq: Once | INTRAMUSCULAR | Status: DC | PRN

## 2024-02-14 MED ORDER — LIDOCAINE 2% (20 MG/ML) 5 ML SYRINGE
INTRAMUSCULAR | Status: DC | PRN
Start: 2024-02-14 — End: 2024-02-14
  Administered 2024-02-14: 60 mg via INTRAVENOUS

## 2024-02-14 MED ORDER — CHLORHEXIDINE GLUCONATE CLOTH 2 % EX PADS
6.0000 | MEDICATED_PAD | Freq: Once | CUTANEOUS | Status: AC
  Administered 2024-02-14: 6 via TOPICAL

## 2024-02-14 MED ORDER — POTASSIUM CHLORIDE IN NACL 20-0.9 MEQ/L-% IV SOLN
INTRAVENOUS | Status: DC
  Filled 2024-02-14: qty 1000

## 2024-02-14 MED ORDER — CHLORHEXIDINE GLUCONATE 0.12 % MT SOLN
OROMUCOSAL | Status: AC
Start: 2024-02-14 — End: 2024-02-14
  Administered 2024-02-14: 15 mL via OROMUCOSAL
  Filled 2024-02-14: qty 15

## 2024-02-14 MED ORDER — SODIUM CHLORIDE 0.9 % IV SOLN: 12.5000 mg | INTRAVENOUS | Status: DC | PRN

## 2024-02-14 NOTE — Sedation Documentation (Signed)
Patient under the care of anesthesia 

## 2024-02-14 NOTE — H&P (Signed)
 Chief Complaint   Aneurysm  History of Present Illness  [name] is a 38 year old woman I am seeing in followup having previously suffered subarachnoid hemorrhage at which time she underwent coil embolization of a posterior communicating artery aneurysm, more than two years ago. She has recently undergone longer-term angiographic follow-up demonstrating enlargement of a now irregular Left ophthalmic aneurysm as well as enlargement/recanalization of right-sided aneurysms. She has been pre-treated with ASA/Brilinta  and presents for elective Pipeline embolization of LICA aneurysm.  Past Medical History   Past Medical History:  Diagnosis Date   Anemia    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HCC) 11/2021   Anxiety    Asthma    Bleeding external hemorrhoids 06/04/2022   Depression    NSVT (nonsustained ventricular tachycardia) (HCC) 09/03/2022    Past Surgical History   Past Surgical History:  Procedure Laterality Date   CESAREAN SECTION     CESAREAN SECTION WITH BILATERAL TUBAL LIGATION Bilateral 06/12/2014   Procedure: Repeat CESAREAN SECTION WITH BILATERAL TUBAL LIGATION;  Surgeon: Dickie Carder, MD;  Location: [facility];  Service: Obstetrics;  Laterality: Bilateral;  EDD: 06/19/14 Wants clear C-Section drape for this case   IR ANGIO INTRA EXTRACRAN SEL INTERNAL CAROTID BILAT MOD SED  01/10/2024   IR ANGIO INTRA EXTRACRAN SEL INTERNAL CAROTID UNI L MOD SED  12/21/2021   IR ANGIO INTRA EXTRACRAN SEL INTERNAL CAROTID UNI R MOD SED  12/21/2021   IR ANGIO INTRA EXTRACRAN SEL INTERNAL CAROTID UNI R MOD SED  06/26/2022   IR ANGIO VERTEBRAL SEL VERTEBRAL BILAT MOD SED  12/21/2021   IR ANGIO VERTEBRAL SEL VERTEBRAL BILAT MOD SED  01/10/2024   IR ANGIOGRAM FOLLOW UP STUDY  12/21/2021   IR ANGIOGRAM FOLLOW UP STUDY  12/21/2021   IR TRANSCATH/EMBOLIZ  12/21/2021   IR US  GUIDE VASC ACCESS RIGHT  06/26/2022   IR US  GUIDE VASC ACCESS RIGHT  01/10/2024   RADIOLOGY WITH ANESTHESIA N/A 12/21/2021    Procedure: IR WITH ANESTHESIA;  Surgeon: Lanis Pupa, MD;  Location: [facility] OR;  Service: Radiology;  Laterality: N/A;   TONSILLECTOMY AND ADENOIDECTOMY  1998    Social History   Social History   Tobacco Use   Smoking status: Former    Current packs/day: 0.00    Average packs/day: 0.3 packs/day for 10.0 years (2.5 ttl pk-yrs)    Types: Cigarettes    Start date: 12/21/2011    Quit date: 12/20/2021    Years since quitting: 2.1    Passive exposure: Past   Smokeless tobacco: Never  Vaping Use   Vaping status: Never Used  Substance Use Topics   Alcohol use: Yes    Comment: Socially   Drug use: Not Currently    Types: Marijuana    Medications   Prior to Admission medications   Medication Sig Start Date End Date Taking? Authorizing Provider  levonorgestrel  (MIRENA ) 20 MCG/DAY IUD 1 each by Intrauterine route once.   Yes [provider]  aspirin  EC 81 MG tablet Take 81 mg by mouth daily. Swallow whole.    [provider]  escitalopram  (LEXAPRO ) 20 MG tablet Take 1 tablet (20 mg total) by mouth at bedtime. Patient taking differently: Take 20 mg by mouth daily. 05/21/23   Dove, Myra C, MD  ticagrelor  (BRILINTA ) 90 MG TABS tablet Take by mouth 2 (two) times daily.    [provider]  citalopram  (CELEXA ) 20 MG tablet Take 1 tablet (20 mg total) by mouth daily. 01/25/20 06/18/20  Chandra Toribio POUR, MD  Allergies   Allergies  Allergen Reactions   Shellfish Allergy Swelling   Hydrocodone -Acetaminophen  Nausea And Vomiting    Patient states that it's only if she takes 2   Ivp Dye [Iodinated Contrast Media] Other (See Comments)    Unknown reaction   Misc. Sulfonamide Containing Compounds    Other    Penicillins Hives, Itching and Swelling    REACTION: rash   Sulfamethoxazole Hives and Swelling    REACTION: rash   Latex Itching and Rash    With condoms only    Review of Systems  ROS  Neurologic Exam  Awake, alert, oriented Memory and concentration  grossly intact Speech fluent, appropriate CN grossly intact Motor exam: Upper Extremities Deltoid Bicep Tricep Grip  Right 5/5 5/5 5/5 5/5  Left 5/5 5/5 5/5 5/5   Lower Extremities IP Quad PF DF EHL  Right 5/5 5/5 5/5 5/5 5/5  Left 5/5 5/5 5/5 5/5 5/5   Sensation grossly intact to LT  Impression  - 38 y.o. 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  Plan  - Will proceed with Pipeline embolization of LICA aneurysm  I have reviewed the indications for the procedure as well as the details of the procedure and the expected postoperative course and recovery at length with the patient in the office. We have also reviewed in detail the risks, benefits, and alternatives to the procedure. All questions were answered and Kerry Chavez provided informed consent to proceed.  Kerry Maizes, MD [facility]

## 2024-02-14 NOTE — Transfer of Care (Signed)
 Immediate Anesthesia Transfer of Care Note  Patient: Kerry Chavez  Procedure(s) Performed: RADIOLOGY WITH ANESTHESIA  Patient Location: PACU  Anesthesia Type:General  Level of Consciousness: awake, alert , oriented, and patient cooperative  Airway & Oxygen Therapy: Patient Spontanous Breathing and Patient connected to nasal cannula oxygen  Post-op Assessment: Report given to RN, Post -op Vital signs reviewed and stable, and Patient moving all extremities X 4  Post vital signs: Reviewed and stable  Last Vitals:  Vitals Value Taken Time  BP 127/93 02/14/24 13:50  Temp    Pulse 97 02/14/24 13:56  Resp 19 02/14/24 13:56  SpO2 92 % 02/14/24 13:56  Vitals shown include unfiled device data.  Last Pain:  Vitals:   02/14/24 1048  PainSc: 0-No pain         Complications: There were no known notable events for this encounter.

## 2024-02-14 NOTE — Anesthesia Procedure Notes (Signed)
 Arterial Line Insertion Start/End11/21/2025 10:55 AM, 02/14/2024 11:00 AM Performed by: Claudene Arlin LABOR, CRNA, CRNA  Patient location: Pre-op. Preanesthetic checklist: patient identified, IV checked, site marked, risks and benefits discussed, surgical consent, monitors and equipment checked, pre-op evaluation, timeout performed and anesthesia consent Lidocaine  1% used for infiltration Left, radial was placed Catheter size: 20 G Hand hygiene performed  and maximum sterile barriers used  Allen's test indicative of satisfactory collateral circulation Attempts: 1 Following insertion, dressing applied and Biopatch. Post procedure assessment: normal and unchanged  Patient tolerated the procedure well with no immediate complications.

## 2024-02-14 NOTE — Op Note (Signed)
 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 Gerldine Maizes, MD  HISTORY:    The patient is a 38 y.o. 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  Patient has undergone follow-up angiograms which have demonstrated enlargement of the left ophthalmic aneurysm as well as enlargement of right sided aneurysms as well.  After lengthy discussion in the outpatient clinic, the patient presents today for pipeline embolization of the left ophthalmic aneurysm.  She has been pretreated with aspirin  and Brilinta .  APPROACH:    The technical aspects of the procedure as well as its potential risks and benefits were reviewed with the patient. These risks included but were not limited bleeding, infection, allergic reaction, damage to organs/vital structures, stroke, non-diagnostic procedure, and the catastrophic outcomes of heart attack, coma, and death. With an understanding of these risks, informed consent was obtained and witnessed.    The patient was placed in the supine position on the angiography table and the skin of right groin prepped in the usual sterile fashion. The procedure was performed under general anesthesia.  A 8- French sheath was introduced in the right common femoral artery using Seldinger technique.  A fluorophase sequence was used to document the sheath position.    HEPARIN :  5000 Units total.     CONTRAST AGENT:  See IR records    FLUOROSCOPY TIME:  See IR records   CATHETER(S) AND WIRE(S):     0.035" glidewire   80 cm 6 French NeuronMax guide sheath 125 cm 6 French Berenstein select guide catheter 115 cm 058 Cat 5 guide catheter 150 cm Phenom 27 microcatheter Synchro 2 select standard microwire  VESSELS CATHETERIZED:    Left internal carotid artery Right common femoral  VESSELS STUDIED:     Left internal carotid artery, pre-embolization Left internal carotid artery, post embolization Left internal carotid artery, final control  PIPELINE DEVICE USED (MRI COMPATIBLE): 3.75 x 20mm Pipeline Flex with Shield  PROCEDURAL NARRATIVE:   The guide sheath was introduced over the select guide catheter and 035 wire. The left internal carotid artery was then selected, and the guide sheath was advanced into the proximal cervical left internal carotid artery. The select catheter was then removed without incident. The guide catheter was then introduced over the microcatheter and Synchro standard microwire. The microcatheter was used to select the supraclinoid internal carotid artery followed by the left middle cerebral artery, and the catalyst guide catheter was then advanced to its final position in the distal petrous internal carotid.  Microwire was removed and the above Pipeline device introduced. Utilizing live fluoro and single shots, the device was carefully deployed across the aneurysm. The pusher wire was then recaptured and removed. The microcatheter was then removed without incident. The guide catheter and sheath were then withdrawn into the distal cervical internal carotid artery, and final control angiogram was taken. The entire catheter construct was then removed synchronously without incident.  INTERPRETATION:    Left internal carotid artery, pre-embolization: Injection demonstrates patency of the internal carotid artery, with normal bifurcation into anterior and middle cerebral arteries. The previously described ophthalmic aneurysm is again identified. Capillary phases unremarkable. Venous phases normal.  Left internal carotid, post-embolization: Post-deployment angiograms taken immediately and a delayed fashion reveal widely patent carotid artery. Pipeline device is widely patent with stable position and good vessel wall apposition both proximally and distally.  There is  a very small  segment of mall apposition likely related to fusiform dilatation of the distal cavernous segment.  Nonetheless, this is distinct and proximal to the aneurysm neck. No filling defects are seen. There is minimal contrast stasis within the aneurysm.  Left carotid artery, final control: Injection demonstrates patency of the internal carotid artery, with normal bifurcation. The distal branches of the anterior cerebral and middle cerebral arteries are normal. Pipeline device is stable, without any filling defects seen. Capillary phase does not demonstrate any perfusion deficits. Venous phases is unremarkable.  Right femoral:     Normal vessel. No significant atherosclerotic disease. Arterial sheath in adequate position.   DISPOSITION:   Upon completion of the study, the femoral sheath was removed and hemostasis obtained using a 8-Fr Angio-Seal closure device. Good proximal and distal lower extremity pulses were documented upon achievement of hemostasis. The procedure was well tolerated and no early complications were observed.   The patient was extubated and taken to the postanesthesia care unit in stable hemodynamic condition.  IMPRESSION:   1. Successful Pipeline embolization of a left ophthalmic aneurysm without local thrombotic or distal embolic complication.   Gerldine Maizes, MD [facility]

## 2024-02-14 NOTE — Progress Notes (Signed)
 eLink Physician-Brief Progress Note Patient Name: Kerry Chavez DOB: November 28, 1985 MRN: [id number]   Date of Service  02/14/2024  HPI/Events of Note  Status post pipeline embolization of the left internal carotid artery Intraoperative IV fluids still running, but the patient is eating and drinking  eICU Interventions  DC IVF     Intervention Category Minor Interventions: Routine modifications to care plan (e.g. PRN medications for pain, fever)  Adriano Bischof 02/14/2024, 8:47 PM

## 2024-02-14 NOTE — Consult Note (Signed)
 NAME:  Kerry Chavez, MRN:  [id number], DOB:  Jan 02, 1986, LOS: 1 ADMISSION DATE:  02/14/2024, CONSULTATION DATE:  11/21 REFERRING MD:  Dr. Lanis, CHIEF COMPLAINT:  S/p pipeline embolization of LICA aneurysm   History of Present Illness:  Patient is a 38 yo F w/ previous hx of SAH and previous coil embolization of right posterior communicating artery aneurysm presents to [facility] on 11/21 for elective surgery/pipeline embolization.  Patient follows with NSG outpt. Angiograms showing enlargement of the left and right ophthalmic aneurysm. Pretreated w/ ASA and Brilinta . Came to [facility] on 11/21 for pipeline embolization of LICA. Post op taken to 4N ICU. SBP goal <160. Pccm consulted.  Pertinent  Medical History   Past Medical History:  Diagnosis Date   Anemia    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HCC) 11/2021   Anxiety    Asthma    Bleeding external hemorrhoids 06/04/2022   Depression    NSVT (nonsustained ventricular tachycardia) (HCC) 09/03/2022     Significant Hospital Events: Including procedures, antibiotic start and stop dates in addition to other pertinent events   11/21 s/p pipeline embolization of LICA  Interim History / Subjective:  See above  Objective    Blood pressure 131/82, pulse (!) 111, temperature 98.1 F (36.7 C), resp. rate 20, last menstrual period 02/05/2024, SpO2 95%.        Intake/Output Summary (Last 24 hours) at 02/14/2024 1845 Last data filed at 02/14/2024 1342 Gross per 24 hour  Intake 900 ml  Output --  Net 900 ml   There were no vitals filed for this visit.  Examination: General: pleasant middle aged adult female, lying in ICU bed in NAD  HENT: Normocephalic, PERRLA intact- 2MM-B/L, teeth intact, pink MM  Lungs: clear, diminished, RA, no distress  Cardiovascular: s1,s2 RRR, no JVD, No MRG Abdomen: bs, active, soft  Extremities: moves all extremities to command with ease, no deficits  Neuro: alert, oriented x 4, no focal neuro deficits   GU: deferred   Resolved problem list   Assessment and Plan   S/p pipeline embolization of LICA Previous hx of sah s/p coil embolization P: -nsg primary -frequent neuro checks -limit sedating meds -SBP goal per nsg -Continue neuroprotective measures- normothermia, euglycemia, HOB greater than 30, head in neutral alignment, normocapnia, normoxia -when to resume asa and brilinta  per nsg -pain management Obtain CBC, BMET in morning   Hx of anxiety/depression P: Restart lexapro     Labs   CBC: Recent Labs  Lab 02/11/24 0930  WBC 12.0*  NEUTROABS 9.7*  HGB 10.3*  HCT 33.7*  MCV 84.9  PLT 372    Basic Metabolic Panel: Recent Labs  Lab 02/11/24 0930  NA 138  K 3.4*  CL 106  CO2 23  GLUCOSE 105*  BUN 7  CREATININE 0.74  CALCIUM 8.7*   GFR: Estimated Creatinine Clearance: 98.9 mL/min (by C-G formula based on SCr of 0.74 mg/dL). Recent Labs  Lab 02/11/24 0930  WBC 12.0*    Liver Function Tests: No results for input(s): AST, ALT, ALKPHOS, BILITOT, PROT, ALBUMIN in the last 168 hours. No results for input(s): LIPASE, AMYLASE in the last 168 hours. No results for input(s): AMMONIA in the last 168 hours.  ABG No results found for: PHART, PCO2ART, PO2ART, HCO3, TCO2, ACIDBASEDEF, O2SAT   Coagulation Profile: Recent Labs  Lab 02/11/24 0930  INR 1.0    Cardiac Enzymes: No results for input(s): CKTOTAL, CKMB, CKMBINDEX, TROPONINI in the last 168 hours.  HbA1C: No results found for: HGBA1C  CBG: No results for input(s): GLUCAP in the last 168 hours.  Review of Systems:   Review of Systems  Constitutional: Negative.   HENT: Negative.    Eyes: Negative.   Respiratory: Negative.    Cardiovascular: Negative.   Gastrointestinal: Negative.   Genitourinary: Negative.   Musculoskeletal: Negative.   Skin: Negative.   Neurological: Negative.   Endo/Heme/Allergies: Negative.   Psychiatric/Behavioral:  The  patient is nervous/anxious.      Past Medical History:  She,  has a past medical history of Anemia, 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HCC) (11/2021), Anxiety, Asthma, Bleeding external hemorrhoids (06/04/2022), Depression, and NSVT (nonsustained ventricular tachycardia) (HCC) (09/03/2022).   Surgical History:   Past Surgical History:  Procedure Laterality Date   CESAREAN SECTION     CESAREAN SECTION WITH BILATERAL TUBAL LIGATION Bilateral 06/12/2014   Procedure: Repeat CESAREAN SECTION WITH BILATERAL TUBAL LIGATION;  Surgeon: Dickie Carder, MD;  Location: [facility] ORS;  Service: Obstetrics;  Laterality: Bilateral;  EDD: 06/19/14 Wants clear C-Section drape for this case   IR ANGIO INTRA EXTRACRAN SEL INTERNAL CAROTID BILAT MOD SED  01/10/2024   IR ANGIO INTRA EXTRACRAN SEL INTERNAL CAROTID UNI L MOD SED  12/21/2021   IR ANGIO INTRA EXTRACRAN SEL INTERNAL CAROTID UNI R MOD SED  12/21/2021   IR ANGIO INTRA EXTRACRAN SEL INTERNAL CAROTID UNI R MOD SED  06/26/2022   IR ANGIO VERTEBRAL SEL VERTEBRAL BILAT MOD SED  12/21/2021   IR ANGIO VERTEBRAL SEL VERTEBRAL BILAT MOD SED  01/10/2024   IR ANGIOGRAM FOLLOW UP STUDY  12/21/2021   IR ANGIOGRAM FOLLOW UP STUDY  12/21/2021   IR TRANSCATH/EMBOLIZ  12/21/2021   IR US  GUIDE VASC ACCESS RIGHT  06/26/2022   IR US  GUIDE VASC ACCESS RIGHT  01/10/2024   RADIOLOGY WITH ANESTHESIA N/A 12/21/2021   Procedure: IR WITH ANESTHESIA;  Surgeon: Lanis Pupa, MD;  Location: [facility] OR;  Service: Radiology;  Laterality: N/A;   TONSILLECTOMY AND ADENOIDECTOMY  1998     Social History:   reports that she quit smoking about 2 years ago. Her smoking use included cigarettes. She started smoking about 12 years ago. She has a 2.5 pack-year smoking history. She has been exposed to tobacco smoke. She has never used smokeless tobacco. She reports current alcohol use. She reports that she does not currently use drugs after having used the following drugs: Marijuana.   Family  History:  Her family history includes Breast cancer in her paternal aunt; CVA in her father; Cerebral aneurysm in her father; Diabetes in her mother; Kidney disease in her brother.   Allergies Allergies  Allergen Reactions   Shellfish Allergy Swelling   Hydrocodone -Acetaminophen  Nausea And Vomiting    Patient states that it's only if she takes 2   Ivp Dye [Iodinated Contrast Media] Other (See Comments)    Unknown reaction   Misc. Sulfonamide Containing Compounds    Other    Penicillins Hives, Itching and Swelling    REACTION: rash   Sulfamethoxazole Hives and Swelling    REACTION: rash   Latex Itching and Rash    With condoms only     Home Medications  Prior to Admission medications   Medication Sig Start Date End Date Taking? Authorizing Provider  aspirin  EC 81 MG tablet Take 81 mg by mouth daily. Swallow whole.   Yes [provider]  levonorgestrel  (MIRENA ) 20 MCG/DAY IUD 1 each by Intrauterine route once.   Yes [provider]  ticagrelor  (BRILINTA ) 90 MG TABS tablet Take  by mouth 2 (two) times daily.   Yes [provider]  escitalopram  (LEXAPRO ) 20 MG tablet Take 1 tablet (20 mg total) by mouth at bedtime. Patient taking differently: Take 20 mg by mouth daily. 05/21/23   Dove, Myra C, MD  citalopram  (CELEXA ) 20 MG tablet Take 1 tablet (20 mg total) by mouth daily. 01/25/20 06/18/20  Chandra Toribio POUR, MD     Critical care time: 45 mins     Christian Shariya Gaster AGACNP-BC   [facility] 02/14/2024, 7:25 PM  Please see [email or web address] for pager details.  From 7A-7P if no response, please call [phone]. After hours, please call ELink [phone].

## 2024-02-14 NOTE — Anesthesia Procedure Notes (Signed)
 Procedure Name: Intubation Date/Time: 02/14/2024 12:22 PM  Performed by: Sherlyn Lapine, CRNAPre-anesthesia Checklist: Patient identified, Emergency Drugs available, Suction available and Patient being monitored Patient Re-evaluated:Patient Re-evaluated prior to induction Oxygen Delivery Method: Circle System Utilized Preoxygenation: Pre-oxygenation with 100% oxygen Induction Type: IV induction Ventilation: Mask ventilation without difficulty Laryngoscope Size: Miller and 2 Grade View: Grade I Tube type: Oral Tube size: 7.0 mm Number of attempts: 1 Airway Equipment and Method: Stylet and Oral airway Placement Confirmation: ETT inserted through vocal cords under direct vision, positive ETCO2 and breath sounds checked- equal and bilateral Secured at: 23 cm Tube secured with: Tape Dental Injury: Teeth and Oropharynx as per pre-operative assessment

## 2024-02-15 DIAGNOSIS — I671 Cerebral aneurysm, nonruptured: Secondary | ICD-10-CM | POA: Diagnosis not present

## 2024-02-15 LAB — BASIC METABOLIC PANEL WITH GFR
Anion gap: 10 (ref 5–15)
BUN: <5 mg/dL — ABNORMAL LOW (ref 6–20)
CO2: 21 mmol/L — ABNORMAL LOW (ref 22–32)
Calcium: 8.6 mg/dL — ABNORMAL LOW (ref 8.9–10.3)
Chloride: 105 mmol/L (ref 98–111)
Creatinine, Ser: 0.66 mg/dL (ref 0.44–1.00)
GFR, Estimated: >60 mL/min (ref >60)
Glucose, Bld: 110 mg/dL — ABNORMAL HIGH (ref 70–99)
Potassium: 3.9 mmol/L (ref 3.5–5.1)
Sodium: 136 mmol/L (ref 135–145)

## 2024-02-15 LAB — CBC
HCT: 28.5 % — ABNORMAL LOW (ref 36.0–46.0)
Hemoglobin: 8.9 g/dL — ABNORMAL LOW (ref 12.0–15.0)
MCH: 25.8 pg — ABNORMAL LOW (ref 26.0–34.0)
MCHC: 31.2 g/dL (ref 30.0–36.0)
MCV: 82.6 fL (ref 80.0–100.0)
Platelets: 352 K/uL (ref 150–400)
RBC: 3.45 MIL/uL — ABNORMAL LOW (ref 3.87–5.11)
RDW: 19.5 % — ABNORMAL HIGH (ref 11.5–15.5)
WBC: 14 K/uL — ABNORMAL HIGH (ref 4.0–10.5)
nRBC: 0 % (ref 0.0–0.2)

## 2024-02-15 MED ORDER — ACETAMINOPHEN 500 MG PO TABS
1000.0000 mg | ORAL_TABLET | Freq: Once | ORAL | Status: AC
  Administered 2024-02-15: 1000 mg via ORAL
  Filled 2024-02-15: qty 2

## 2024-02-15 MED ORDER — ASPIRIN 81 MG PO CHEW
81.0000 mg | CHEWABLE_TABLET | Freq: Once | ORAL | Status: AC
  Administered 2024-02-15: 81 mg via ORAL
  Filled 2024-02-15: qty 1

## 2024-02-15 NOTE — Discharge Summary (Signed)
   Physician Discharge Summary  Patient ID: Kerry Chavez MRN: [id number] DOB/AGE: May 18, 1985 38 y.o.  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 female who underwent elective pipeline embolization of cerebral aneurysm. Postoperatively, the patient was mobilized without difficulty.  Her groin was soft.  Neurologically she remained intact.  She was ambulating well patient was deemed ready for discharge.   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 rate 19, last menstrual period 02/05/2024, SpO2 97%. Awake, alert, oriented Speech fluent, appropriate CN grossly intact 5/5 BUE/BLE Wound c/d/i  Disposition: Discharge disposition: 01-Home or Self Care       Discharge Instructions     Discharge patient   Complete by: As directed    Discharge disposition: 01-Home or Self Care   Discharge patient date: 02/15/2024      Allergies as of 02/15/2024       Reactions   Shellfish Allergy Swelling   Hydrocodone -acetaminophen  Nausea And Vomiting   Patient states that it's only if she takes 2   Ivp Dye [iodinated Contrast Media] Other (See Comments)   Unknown reaction   Misc. Sulfonamide Containing Compounds    Other    Penicillins Hives, Itching, Swelling   REACTION: rash   Sulfamethoxazole Hives, Swelling   REACTION: rash   Latex Itching, Rash   With condoms only        Medication List     TAKE these medications    aspirin  EC 81 MG tablet Take 81 mg by mouth daily. Swallow whole.   escitalopram  20 MG  tablet Commonly known as: Lexapro  Take 1 tablet (20 mg total) by mouth at bedtime. What changed: when to take this   levonorgestrel  20 MCG/DAY Iud Commonly known as: MIRENA  1 each by Intrauterine route once.   ticagrelor  90 MG Tabs tablet Commonly known as: BRILINTA  Take by mouth 2 (two) times daily.        Follow-up Information     Lanis Pupa, MD. Schedule an appointment as soon as possible for a visit in 2 week(s).   Specialty: Neurosurgery Contact information: [address] [place] [address] [phone]                 Signed: Dorn KANDICE Ned 02/15/2024, 10:39 AM

## 2024-02-15 NOTE — Discharge Instructions (Signed)
.  [facility]

## 2024-02-17 ENCOUNTER — Encounter (HOSPITAL_COMMUNITY): Payer: Self-pay | Admitting: Neurosurgery

## 2024-02-17 NOTE — Anesthesia Postprocedure Evaluation (Signed)
 Anesthesia Post Note  Patient: Kerry Chavez  Procedure(s) Performed: RADIOLOGY WITH ANESTHESIA     Patient location during evaluation: PACU Anesthesia Type: General Level of consciousness: awake and alert Pain management: pain level controlled Vital Signs Assessment: post-procedure vital signs reviewed and stable Respiratory status: spontaneous breathing, nonlabored ventilation and respiratory function stable Cardiovascular status: stable and blood pressure returned to baseline Anesthetic complications: no   There were no known notable events for this encounter.  Last Vitals:  Vitals:   02/15/24 1100 02/15/24 1101  BP:    Pulse: 90 95  Resp: (!) 24 (!) 24  Temp: 36.7 C   SpO2: 98% 97%    Last Pain:  Vitals:   02/15/24 1100  TempSrc: Oral  PainSc:                  Debby FORBES Like

## 2024-02-18 ENCOUNTER — Encounter (HOSPITAL_COMMUNITY): Payer: Self-pay

## 2024-02-18 ENCOUNTER — Other Ambulatory Visit (HOSPITAL_COMMUNITY): Payer: Self-pay | Admitting: Neurosurgery

## 2024-02-18 DIAGNOSIS — I671 Cerebral aneurysm, nonruptured: Secondary | ICD-10-CM

## 2024-02-18 HISTORY — PX: IR NEURO EACH ADD'L AFTER BASIC UNI LEFT (MS): IMG5373

## 2024-03-04 DIAGNOSIS — I609 Nontraumatic subarachnoid hemorrhage, unspecified: Secondary | ICD-10-CM | POA: Diagnosis not present

## 2024-03-04 DIAGNOSIS — I671 Cerebral aneurysm, nonruptured: Secondary | ICD-10-CM | POA: Diagnosis not present

## 2024-03-05 ENCOUNTER — Other Ambulatory Visit: Payer: Self-pay | Admitting: Neurosurgery

## 2024-03-05 ENCOUNTER — Other Ambulatory Visit (HOSPITAL_COMMUNITY): Payer: Self-pay | Admitting: Neurosurgery

## 2024-03-05 DIAGNOSIS — I671 Cerebral aneurysm, nonruptured: Secondary | ICD-10-CM

## 2024-04-03 ENCOUNTER — Telehealth: Payer: Self-pay | Admitting: *Deleted

## 2024-04-03 ENCOUNTER — Ambulatory Visit: Payer: Self-pay

## 2024-04-03 ENCOUNTER — Other Ambulatory Visit: Payer: Self-pay | Admitting: Student

## 2024-04-03 ENCOUNTER — Ambulatory Visit: Admitting: Student

## 2024-04-03 ENCOUNTER — Other Ambulatory Visit: Payer: Self-pay

## 2024-04-03 ENCOUNTER — Encounter: Payer: Self-pay | Admitting: Student

## 2024-04-03 VITALS — BP 127/77 | HR 106 | Temp 98.6°F | Ht 61.0 in | Wt 211.2 lb

## 2024-04-03 DIAGNOSIS — L700 Acne vulgaris: Secondary | ICD-10-CM

## 2024-04-03 MED ORDER — BENZOYL PEROXIDE-ERYTHROMYCIN 5-3 % EX GEL: CUTANEOUS | 0 refills | Status: AC

## 2024-04-03 MED ORDER — DOXYCYCLINE HYCLATE 50 MG PO CAPS: 50.0000 mg | ORAL_CAPSULE | Freq: Two times a day (BID) | ORAL | 0 refills | Status: AC

## 2024-04-03 MED ORDER — BENZOYL PEROXIDE-ERYTHROMYCIN 5-3 % EX GEL: CUTANEOUS | 0 refills | Status: DC

## 2024-04-03 NOTE — Addendum Note (Signed)
 Addended by: HARRIE BRUCKNER on: 04/03/2024 01:40 PM   Modules accepted: Orders

## 2024-04-03 NOTE — Telephone Encounter (Signed)
 Will forward to Dr. Harrie.                  Copied from CRM #[id number]. Topic: Clinical - Prescription Issue >> Apr 03, 2024 10:17 AM DeAngela L wrote: Reason for CRM: Patient calling back to inform the doctor that her pharmacy faxed something to the office and waiting for a response back and also the patient insurance doesn't cover this medication  benzoyl peroxide -erythromycin  (BENZAMYCIN) gel   [id number] - [place], [place] - [address] AT [facility] OF [address] [place] [address] Phone: [phone] Fax: [phone]  Pt num  [phone]

## 2024-04-03 NOTE — Progress Notes (Addendum)
" ° °  CC: Face rash  HPI:  Kerry Chavez is a 39 y.o. female with a PMH stated below who presents today for a rash on the face.  Please see problem based assessment and plan for additional details.  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  Vitals:   04/03/24 0909  BP: 127/77  Pulse: (!) 106  Temp: 98.6 F (37 C)  TempSrc: Oral  SpO2: 97%  Weight: 211 lb 3.2 oz (95.8 kg)  Height: 5' 1 (1.549 m)   Physical Exam: Constitutional: well-appearing woman in no acute distress HENT: normocephalic atraumatic, mucous membranes moist. Comedonal rash on the face - bilateral cheeks, glabella, chin, spares the scalp, no open lesions Eyes: conjunctiva non-erythematous Cardiovascular: regular rate and rhythm, no m/r/g Pulmonary/Chest: normal work of breathing on room air, lungs clear to auscultation bilaterally Abdominal: soft, non-tender, non-distended MSK: normal bulk and tone Neurological: alert & oriented x 3, no focal deficit Skin: warm and dry Psych: normal mood and behavior  Assessment & Plan:   Patient seen with Dr. Francesco Assessment & Plan Acne vulgaris 7-day history of itchy rash on the face and shoulders.  No new exposures or lotions. Includes comedones.  Spares the scalp.  No redness or pustular lesions.  No scales.  Says has happened once in the past and received a pill that helped it go away.  Self treatment with Selsun Blue has improved somewhat.  Could consider seborrheic dermatitis but presence of comedones and sparing of the scalp and skin/hair border argues against it.  For now treatment topically for acne vulgaris. - Benzyl peroxide erythromycin  ointment provided - If not  improved over the weekend, will send Rx for doxycycline   ADD: pt called to say that ointment too expensive. Will instead treat with doxycycline  50 BID with food x 2 weeks and benzoyl peroxide  ointment.  Lonni Africa, D.O. [facility] Internal Medicine, PGY-2 Phone: [phone] Date 04/03/2024 Time 11:59 AM "

## 2024-04-03 NOTE — Pre-Procedure Instructions (Signed)
 Surgical Instructions   Your procedure is scheduled on April 10, 2024. Report to [facility] Main Entrance A at 9:30 A.M., then check in with the Admitting office. Any questions or running late day of surgery: call [phone]  Questions prior to your surgery date: call [phone], Monday-Friday, 8am-4pm. If you experience any cold or flu symptoms such as cough, fever, chills, shortness of breath, etc. between now and your scheduled surgery, please notify us  at the above number.     Remember:  Do not eat or drink after midnight the night before your surgery   Take these medicines the morning of surgery with A SIP OF WATER : Aspirin  Escitalopram  (Lexapro ) Ticagrelor  (Brilinta )  May take these medicines IF NEEDED: Acetaminophen  (Tylenol )   One week prior to surgery, STOP taking any Aleve, Naproxen, Ibuprofen , Motrin , Advil , Goody's, BC's, all herbal medications, fish oil, and non-prescription vitamins.                     Do NOT Smoke (Tobacco/Vaping) for 24 hours prior to your procedure.  If you use a CPAP at night, you may bring your mask/headgear for your overnight stay.   You will be asked to remove any contacts, glasses, piercing's, hearing aid's, dentures/partials prior to surgery. Please bring cases for these items if needed.    Your surgeon will determine if you are to be admitted or discharged the same day.  Patients discharged the day of surgery will not be allowed to drive home, and someone needs to stay with them for 24 hours.  SURGICAL WAITING ROOM VISITATION Patients may have no more than 2 support people in the waiting area - these visitors may rotate.   Pre-op nurse will coordinate an appropriate time for 2 ADULT support persons, who may not rotate, to accompany patient in pre-op.  Children under the age of 8 must have an adult with them who is not the patient and must remain in the main waiting area with an adult.  If the patient needs to stay at the  hospital during part of their recovery, the visitor guidelines for inpatient rooms apply.  Please refer to the [facility] website for the visitor guidelines for any additional information.   If you received a COVID test during your pre-op visit  it is requested that you wear a mask when out in public, stay away from anyone that may not be feeling well and notify your surgeon if you develop symptoms. If you have been in contact with anyone that has tested positive in the last 10 days please notify you surgeon.      Pre-operative CHG Bathing Instructions   You can play a key role in reducing the risk of infection after surgery. Your skin needs to be as free of germs as possible. You can reduce the number of germs on your skin by washing with CHG (chlorhexidine  gluconate) soap before surgery. CHG is an antiseptic soap that kills germs and continues to kill germs even after washing.   DO NOT use if you have an allergy to chlorhexidine /CHG or antibacterial soaps. If your skin becomes reddened or irritated, stop using the CHG and notify one of our RNs at [phone].              TAKE A SHOWER THE NIGHT BEFORE SURGERY   Please keep in mind the following:  DO NOT shave, including legs and underarms, 48 hours prior to surgery.   You may shave your face before/day  of surgery.  Place clean sheets on your bed the night before surgery Use a clean washcloth (not used since being washed) for shower. DO NOT sleep with pet's night before surgery.  CHG Shower Instructions:  Wash your face and private area with normal soap. If you choose to wash your hair, wash first with your normal shampoo.  After you use shampoo/soap, rinse your hair and body thoroughly to remove shampoo/soap residue.  Turn the water OFF and apply half the bottle of CHG soap to a CLEAN washcloth.  Apply CHG soap ONLY FROM YOUR NECK DOWN TO YOUR TOES (washing for 3-5 minutes)  DO NOT use CHG soap on face, private areas, open wounds,  or sores.  Pay special attention to the area where your surgery is being performed.  If you are having back surgery, having someone wash your back for you may be helpful. Wait 2 minutes after CHG soap is applied, then you may rinse off the CHG soap.  Pat dry with a clean towel  Put on clean pajamas    Additional instructions for the day of surgery: If you choose, you may shower the morning of surgery with an antibacterial soap.  DO NOT APPLY any lotions, deodorants, cologne, or perfumes.   Do not wear jewelry or makeup Do not wear nail polish, gel polish, artificial nails, or any other type of covering on natural nails (fingers and toes) Do not bring valuables to the hospital. [facility] is not responsible for valuables/personal belongings. Put on clean/comfortable clothes.  Please brush your teeth.  Ask your nurse before applying any prescription medications to the skin.

## 2024-04-03 NOTE — Telephone Encounter (Unsigned)
 Copied from CRM [id number]. Topic: Clinical - Refused Triage >> Apr 03, 2024  8:39 AM Farrel B wrote: Patient/caller voiced complaints of inflamed rash on face, she stated that it appears to be spreading to her shoulders was concerned because she has surgery next week. Offered NT she stated that she had attempted urgent care however, the were extremely packed Declined DT because she needed an appt ASAP and didn't have time to wait for a call, literally begging to try and get her in . Declined transfer to triage.

## 2024-04-04 NOTE — Progress Notes (Signed)
 Internal Medicine Clinic Attending  I was physically present during the key portions of the resident provided service and participated in the medical decision making of patient's management care. I reviewed pertinent patient test results.  The assessment, diagnosis, and plan were formulated together and I agree with the documentation in the resident's note.  Francesco Elsie NOVAK, MD

## 2024-04-06 ENCOUNTER — Inpatient Hospital Stay (HOSPITAL_COMMUNITY)
Admission: RE | Admit: 2024-04-06 | Discharge: 2024-04-06 | Disposition: A | Source: Ambulatory Visit | Attending: Neurosurgery

## 2024-04-06 ENCOUNTER — Encounter (HOSPITAL_COMMUNITY): Payer: Self-pay

## 2024-04-06 ENCOUNTER — Other Ambulatory Visit: Payer: Self-pay

## 2024-04-06 VITALS — BP 110/84 | HR 101 | Temp 98.2°F | Resp 17 | Ht 61.0 in | Wt 210.4 lb

## 2024-04-06 DIAGNOSIS — Z8673 Personal history of transient ischemic attack (TIA), and cerebral infarction without residual deficits: Secondary | ICD-10-CM | POA: Insufficient documentation

## 2024-04-06 DIAGNOSIS — Z01812 Encounter for preprocedural laboratory examination: Secondary | ICD-10-CM | POA: Diagnosis present

## 2024-04-06 DIAGNOSIS — D649 Anemia, unspecified: Secondary | ICD-10-CM | POA: Diagnosis not present

## 2024-04-06 DIAGNOSIS — I10 Essential (primary) hypertension: Secondary | ICD-10-CM | POA: Diagnosis not present

## 2024-04-06 DIAGNOSIS — Z01818 Encounter for other preprocedural examination: Secondary | ICD-10-CM

## 2024-04-06 LAB — CBC WITH DIFFERENTIAL/PLATELET
Abs Immature Granulocytes: 0.04 K/uL (ref 0.00–0.07)
Basophils Absolute: 0.1 K/uL (ref 0.0–0.1)
Basophils Relative: 1 %
Eosinophils Absolute: 0.1 K/uL (ref 0.0–0.5)
Eosinophils Relative: 1 %
HCT: 32.1 % — ABNORMAL LOW (ref 36.0–46.0)
Hemoglobin: 9.7 g/dL — ABNORMAL LOW (ref 12.0–15.0)
Immature Granulocytes: 0 %
Lymphocytes Relative: 15 %
Lymphs Abs: 1.5 K/uL (ref 0.7–4.0)
MCH: 26.1 pg (ref 26.0–34.0)
MCHC: 30.2 g/dL (ref 30.0–36.0)
MCV: 86.5 fL (ref 80.0–100.0)
Monocytes Absolute: 0.6 K/uL (ref 0.1–1.0)
Monocytes Relative: 6 %
Neutro Abs: 7.7 K/uL (ref 1.7–7.7)
Neutrophils Relative %: 77 %
Platelets: 372 K/uL (ref 150–400)
RBC: 3.71 MIL/uL — ABNORMAL LOW (ref 3.87–5.11)
RDW: 20.1 % — ABNORMAL HIGH (ref 11.5–15.5)
WBC: 10 K/uL (ref 4.0–10.5)
nRBC: 0 % (ref 0.0–0.2)

## 2024-04-06 LAB — BASIC METABOLIC PANEL WITH GFR
Anion gap: 9 (ref 5–15)
BUN: 10 mg/dL (ref 6–20)
CO2: 23 mmol/L (ref 22–32)
Calcium: 9 mg/dL (ref 8.9–10.3)
Chloride: 106 mmol/L (ref 98–111)
Creatinine, Ser: 0.66 mg/dL (ref 0.44–1.00)
GFR, Estimated: >60 mL/min
Glucose, Bld: 86 mg/dL (ref 70–99)
Potassium: 4.1 mmol/L (ref 3.5–5.1)
Sodium: 138 mmol/L (ref 135–145)

## 2024-04-06 LAB — PROTIME-INR
INR: 1 (ref 0.8–1.2)
Prothrombin Time: 13.6 s (ref 11.4–15.2)

## 2024-04-06 LAB — APTT: aPTT: 29 s (ref 24–36)

## 2024-04-06 NOTE — Progress Notes (Signed)
 Called main lab about UA results which had not resulted in EPIC.  Lab tech stated, UA needs to be recollected on DOS. Will place order in EPIC for UA on DOS.

## 2024-04-06 NOTE — Progress Notes (Signed)
 PCP - Lonni Africa DO Cardiologist - Dr Newman Lawrence  Chest x-ray - n/a EKG - 06/26/23 Stress Test - 10/01/22 ECHO - 08/28/22 Cardiac Cath - n/a  ICD Pacemaker/Loop - n/a  Sleep Study -  n/a  Diabetes - n/a  Brilinta :  Continue per MD  Aspirin  Instructions: Continue per MD  NPO  Anesthesia review: Yes, Hx NSVT  STOP now taking any Aspirin  (unless otherwise instructed by your surgeon), Aleve, Naproxen, Ibuprofen , Motrin , Advil , Goody's, BC's, all herbal medications, fish oil, and all vitamins.   Coronavirus Screening Do you have any of the following symptoms:  Cough yes/no: No Fever (>100.7F)  yes/no: No Runny nose yes/no: No Sore throat yes/no: No Difficulty breathing/shortness of breath  yes/no: No  Have you traveled in the last 14 days and where? yes/no: No  Patient verbalized understanding of instructions that were given to them at the PAT appointment. Patient was also instructed that they will need to review over the PAT instructions again at home before surgery.

## 2024-04-07 NOTE — Anesthesia Preprocedure Evaluation (Addendum)
"                                    Anesthesia Evaluation  Patient identified by MRN, date of birth, ID band Patient awake    Reviewed: Allergy & Precautions, NPO status , Patient's Chart, lab work & pertinent test results  History of Anesthesia Complications Negative for: history of anesthetic complications  Airway Mallampati: II  TM Distance: >3 FB Neck ROM: Full    Dental  (+) Dental Advisory Given   Pulmonary asthma , former smoker   Pulmonary exam normal        Cardiovascular Normal cardiovascular exam+ dysrhythmias Ventricular Tachycardia    '24 TTE - EF 55-60%. 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 Mild MR and TR.    Neuro/Psych  Headaches PSYCHIATRIC DISORDERS Anxiety Depression     SAH  CVA, No Residual Symptoms    GI/Hepatic negative GI ROS, Neg liver ROS,,,  Endo/Other    Class 3 obesity  Renal/GU negative Renal ROS     Musculoskeletal negative musculoskeletal ROS (+)    Abdominal   Peds  Hematology  On brilinta     Anesthesia Other Findings   Reproductive/Obstetrics                              Anesthesia Physical Anesthesia Plan  ASA: 3  Anesthesia Plan: General   Post-op Pain Management: Tylenol  PO (pre-op)* and Minimal or no pain anticipated   Induction: Intravenous  PONV Risk Score and Plan: 3 and Treatment may vary due to age or medical condition, Ondansetron , Dexamethasone  and Midazolam   Airway Management Planned: Oral ETT  Additional Equipment: Arterial line  Intra-op Plan:   Post-operative Plan: Extubation in OR  Informed Consent: I have reviewed the patients History and Physical, chart, labs and discussed the procedure including the risks, benefits and alternatives for the proposed anesthesia with the patient or authorized representative who has indicated his/her understanding and acceptance.     Dental advisory given  Plan  Discussed with: CRNA and Anesthesiologist  Anesthesia Plan Comments: (PAT note by Lynwood Hope, PA-C)         Anesthesia Quick Evaluation  "

## 2024-04-07 NOTE — Progress Notes (Signed)
 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   Patient was evaluated by cardiology in 2024 for report of chest pain and palpitations.  Workup was benign.  Event monitor 06/2022 showed underlying rhythm to be sinus, 4 episodes of VT, fastest and longest at 194 bpm for 11 beats.  Exercise treadmill stress test 07/2022 negative for ischemia but inconclusive due to inability to reach target heart rate.  Echo 08/2022 EF 55 to 60%, mild-mod regurgitation, mild tricuspid regurgitation.  Nuclear stress test 09/2022 low risk.  Last seen in follow-up by Dr. Elmira 06/26/2023 and noted to be doing well at that time, she had discontinued metoprolol  without recurrence of chest pain or palpitations.  Recommend follow-up as needed.   Cerebral angiogram 01/10/2024 showed development of an irregular appearing, trilobed left ophthalmic artery aneurysm, enlargement of previously described right ophthalmic aneurysm, enlargement of a neck recanalization from the previously coiled right posterior communicating artery aneurysm.  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   Preop labs reviewed, moderate chronic anemia with hemoglobin 9.7, otherwise unremarkable.   EKG 06/26/23: NSR.  Rate 76.   Mobile cardiac telemetry 13 days 07/23/2022 - 08/06/2022: Dominant rhythm: Sinus. HR 56-146 bpm. Avg HR 98 bpm, in sinus rhythm. 0 episodes of SVT. <1% isolated SVE, couplet, no triplets. 4 episodes of VT, fastest and longest at 194 bpm for 11 beats. <1% isolated VE, couplet, no triplets. No atrial fibrillation/atrial flutter/SVT/high grade AV block, sinus pause >3sec noted. 0 patient triggered events.    Regadenoson  Nuclear stress test 10/01/2022: Breast attenuation noted in the inferior  region. Cannot exclude small sized mild ischemia in the apical inferior and apical infero-septal region.  Overall LV systolic function is normal without regional wall motion abnormalities. Stress LV EF: 41%, but visually appears normal.  Nondiagnostic ECG stress. The heart rate response was consistent with Regadenoson .  No previous exam available for comparison. Low risk.    Echocardiogram 08/2022:  Normal LV systolic function with visual EF 55-60%. Left ventricle cavity  is normal in size. Normal left ventricular wall thickness. Normal global  wall motion. Normal diastolic filling pattern, normal LAP.  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  Mild (Grade I) mitral regurgitation.  Mild tricuspid regurgitation. No evidence of pulmonary hypertension.  No prior study for comparison.    Exercise treadmill stress test 07/2022: Exercise treadmill stress test performed using Bruce protocol.  Patient exercised for 5 minutes and 1 seconds, reached 7 METS, and 77%% of age predicted maximum heart rate.  Exercise capacity was low.  No chest pain reported.  Heart rate response limited due to low exercise capacity. Normal blood pressure response. Stress EKG at 77% MPHR revealed no ischemic changes. Inconclusive exercise treadmill stress test due to inability to reach 85% MPHR.    Lynwood Geofm RIGGERS [facility]/Anesthesiology Phone [phone] 04/07/2024 1:37 PM

## 2024-04-08 ENCOUNTER — Ambulatory Visit: Payer: Self-pay

## 2024-04-08 NOTE — Telephone Encounter (Addendum)
 I called / talked to pt - she stated she has been taking Doxycycline  since the 9th. Stated her face has been itching like crazy. She did not take it yesterday but she did take Benadryl . Denies having trouble breathing ; just the rash and itching. Offered an appt for tomorrow; stated she wants to wait until she hears from the doctor; she also has an appt w/derm later this month. Instructed pt to go to UC/ER if if symptoms worsen, develop any trouble breathing, swelling.

## 2024-04-08 NOTE — Telephone Encounter (Signed)
 FYI Only or Action Required?: Action required by provider: update on patient condition.  Patient was last seen in primary care on 04/03/2024 by Harrie Bruckner, DO.  Called Nurse Triage reporting Acne.  Symptoms began several days ago.  Interventions attempted: Other: washed face.  Symptoms are: unchanged.  Triage Disposition: See PCP When Office is Open (Within 3 Days)  Patient/caregiver understands and will follow disposition?: Yes   Message from [place] [facility] sent at 04/08/2024  3:23 PM EST  Summary: Mobile [phone]: Medication is causing her to break out more   Reason for Triage: meds have been prescribed to her for rash in her was the pcp had prescribed benzoyl peroxide -erythromycin  (BENZAMYCIN) gel with an allergic reaction to it and doxycyline pill pt was crying on the phone she states she was supposed to have surgery on Friday now she's worried they won't even touch her. She's crying saying she's ugly         Reason for Disposition  [1] Treatment with topical acne medicine (e.g., adapalene gel, benzoyl peroxide  gel, etc.) AND [2] makes the skin itchy or swollen  Answer Assessment - Initial Assessment Questions 1. MAIN SYMPTOM: What is your main concern or symptom right now?  Skin itching, red, and inflamed.  Feels like recently prescribed cream, benzamycin, is irritating her skin.  Patient reports that she is feeling depressed and overwhelmed with her appearance and her upcoming brain surgery 04/10/2024.   Patient needs guidance about how to use cream, benzamycin, or if she should discontinue.  Patient scheduled with dermatology on 04/16/2024  Can be contacted via phone or mychart  Protocols used: Acne-A-AH

## 2024-04-08 NOTE — Telephone Encounter (Signed)
 Talked to pt who she thinks the cream (Benzamycin) not the pill (Doxycycline ) causing the rash on her face. Stated whenever she put the cream on her face, it causes itching and burning (along with the rash). Stated she has washed her face which has helped. Stated she has a lot going on with having stent put in on Friday. And the rash is making her look ugly. Stated she's not suicidal. Offered an appt - she denied. Stated she has a derm appt on 1/22. I will be alright.

## 2024-04-09 NOTE — Telephone Encounter (Signed)
 I had talked to pt yesterday afternoon; refused OV appt.

## 2024-04-10 ENCOUNTER — Observation Stay (HOSPITAL_COMMUNITY)
Admission: RE | Admit: 2024-04-10 | Discharge: 2024-04-11 | Disposition: A | Discharge status: Home / Self Care | Attending: Neurosurgery | Admitting: Neurosurgery

## 2024-04-10 ENCOUNTER — Inpatient Hospital Stay (HOSPITAL_COMMUNITY): Payer: Self-pay | Admitting: Certified Registered"

## 2024-04-10 ENCOUNTER — Encounter (HOSPITAL_COMMUNITY)
Admission: RE | Disposition: A | Discharge status: Home / Self Care | Payer: Self-pay | Source: Home / Self Care | Attending: Neurosurgery

## 2024-04-10 ENCOUNTER — Inpatient Hospital Stay (HOSPITAL_COMMUNITY)

## 2024-04-10 ENCOUNTER — Encounter (HOSPITAL_COMMUNITY): Payer: Self-pay | Admitting: Neurosurgery

## 2024-04-10 ENCOUNTER — Encounter (HOSPITAL_COMMUNITY): Payer: Self-pay | Admitting: Physician Assistant

## 2024-04-10 DIAGNOSIS — Z01818 Encounter for other preprocedural examination: Secondary | ICD-10-CM

## 2024-04-10 DIAGNOSIS — Z8659 Personal history of other mental and behavioral disorders: Secondary | ICD-10-CM

## 2024-04-10 DIAGNOSIS — Z87891 Personal history of nicotine dependence: Secondary | ICD-10-CM | POA: Insufficient documentation

## 2024-04-10 DIAGNOSIS — F418 Other specified anxiety disorders: Secondary | ICD-10-CM

## 2024-04-10 DIAGNOSIS — I720 Aneurysm of carotid artery: Secondary | ICD-10-CM

## 2024-04-10 DIAGNOSIS — I671 Cerebral aneurysm, nonruptured: Secondary | ICD-10-CM

## 2024-04-10 DIAGNOSIS — Z7902 Long term (current) use of antithrombotics/antiplatelets: Secondary | ICD-10-CM | POA: Insufficient documentation

## 2024-04-10 DIAGNOSIS — F419 Anxiety disorder, unspecified: Secondary | ICD-10-CM | POA: Insufficient documentation

## 2024-04-10 DIAGNOSIS — J45909 Unspecified asthma, uncomplicated: Secondary | ICD-10-CM

## 2024-04-10 DIAGNOSIS — F322 Major depressive disorder, single episode, severe without psychotic features: Secondary | ICD-10-CM | POA: Insufficient documentation

## 2024-04-10 DIAGNOSIS — Z8679 Personal history of other diseases of the circulatory system: Secondary | ICD-10-CM | POA: Diagnosis not present

## 2024-04-10 DIAGNOSIS — Z7982 Long term (current) use of aspirin: Secondary | ICD-10-CM | POA: Insufficient documentation

## 2024-04-10 DIAGNOSIS — Z79899 Other long term (current) drug therapy: Secondary | ICD-10-CM | POA: Insufficient documentation

## 2024-04-10 HISTORY — PX: RADIOLOGY WITH ANESTHESIA: SHX6223

## 2024-04-10 HISTORY — PX: IR US GUIDE VASC ACCESS RIGHT: IMG2390

## 2024-04-10 HISTORY — PX: IR ANGIO INTRA EXTRACRAN SEL INTERNAL CAROTID UNI R MOD SED: IMG5362

## 2024-04-10 HISTORY — PX: IR NEURO EACH ADD'L AFTER BASIC UNI RIGHT (MS): IMG5374

## 2024-04-10 HISTORY — PX: IR ANGIOGRAM FOLLOW UP STUDY: IMG697

## 2024-04-10 HISTORY — PX: IR TRANSCATH/EMBOLIZ: IMG695

## 2024-04-10 LAB — URINALYSIS, ROUTINE W REFLEX MICROSCOPIC
Bilirubin Urine: NEGATIVE
Glucose, UA: NEGATIVE mg/dL
Ketones, ur: 5 mg/dL — AB
Leukocytes,Ua: NEGATIVE
Nitrite: NEGATIVE
Protein, ur: 30 mg/dL — AB
Specific Gravity, Urine: 1.024 (ref 1.005–1.030)
pH: 5 (ref 5.0–8.0)

## 2024-04-10 LAB — POCT PREGNANCY, URINE: Preg Test, Ur: NEGATIVE

## 2024-04-10 MED ORDER — ORAL CARE MOUTH RINSE: 15.0000 mL | OROMUCOSAL | Status: DC | PRN

## 2024-04-10 MED ORDER — PHENYLEPHRINE 80 MCG/ML (10ML) SYRINGE FOR IV PUSH (FOR BLOOD PRESSURE SUPPORT)
PREFILLED_SYRINGE | INTRAVENOUS | Status: DC | PRN
  Administered 2024-04-10 (×2): 160 ug via INTRAVENOUS
  Administered 2024-04-10: 80 ug via INTRAVENOUS
  Administered 2024-04-10: 160 ug via INTRAVENOUS
  Administered 2024-04-10: 80 ug via INTRAVENOUS

## 2024-04-10 MED ORDER — CHLORHEXIDINE GLUCONATE CLOTH 2 % EX PADS: 6.0000 | MEDICATED_PAD | Freq: Once | CUTANEOUS | Status: DC

## 2024-04-10 MED ORDER — MORPHINE SULFATE (PF) 2 MG/ML IV SOLN: 1.0000 mg | INTRAVENOUS | Status: DC | PRN

## 2024-04-10 MED ORDER — ACETAMINOPHEN 650 MG RE SUPP: 650.0000 mg | RECTAL | Status: DC | PRN

## 2024-04-10 MED ORDER — SODIUM CHLORIDE 0.9 % IV SOLN: 12.5000 mg | INTRAVENOUS | Status: DC | PRN

## 2024-04-10 MED ORDER — DIPHENHYDRAMINE HCL 25 MG PO CAPS
50.0000 mg | ORAL_CAPSULE | Freq: Once | ORAL | Status: AC
  Administered 2024-04-10: 50 mg via ORAL
  Filled 2024-04-10: qty 2

## 2024-04-10 MED ORDER — ACETAMINOPHEN 500 MG PO TABS
1000.0000 mg | ORAL_TABLET | Freq: Once | ORAL | Status: AC
  Administered 2024-04-10: 1000 mg via ORAL
  Filled 2024-04-10: qty 2

## 2024-04-10 MED ORDER — ONDANSETRON HCL 4 MG/2ML IJ SOLN
INTRAMUSCULAR | Status: DC | PRN
  Administered 2024-04-10: 4 mg via INTRAVENOUS

## 2024-04-10 MED ORDER — PROPOFOL 10 MG/ML IV BOLUS
INTRAVENOUS | Status: DC | PRN
  Administered 2024-04-10: 200 mg via INTRAVENOUS

## 2024-04-10 MED ORDER — DIPHENHYDRAMINE HCL 50 MG/ML IJ SOLN
25.0000 mg | Freq: Once | INTRAMUSCULAR | Status: AC
  Administered 2024-04-10: 25 mg via INTRAVENOUS
  Filled 2024-04-10: qty 1

## 2024-04-10 MED ORDER — PHENYLEPHRINE HCL-NACL 20-0.9 MG/250ML-% IV SOLN
INTRAVENOUS | Status: DC | PRN
  Administered 2024-04-10: 25 ug/min via INTRAVENOUS

## 2024-04-10 MED ORDER — OXYCODONE HCL 5 MG/5ML PO SOLN: 5.0000 mg | Freq: Once | ORAL | Status: DC | PRN

## 2024-04-10 MED ORDER — MIDAZOLAM HCL (PF) 2 MG/2ML IJ SOLN
INTRAMUSCULAR | Status: DC | PRN
  Administered 2024-04-10: 2 mg via INTRAVENOUS

## 2024-04-10 MED ORDER — ACETAMINOPHEN 325 MG PO TABS: 650.0000 mg | ORAL_TABLET | ORAL | Status: DC | PRN

## 2024-04-10 MED ORDER — ROCURONIUM BROMIDE 10 MG/ML (PF) SYRINGE
PREFILLED_SYRINGE | INTRAVENOUS | Status: DC | PRN
  Administered 2024-04-10: 50 mg via INTRAVENOUS
  Administered 2024-04-10: 10 mg via INTRAVENOUS

## 2024-04-10 MED ORDER — DEXAMETHASONE SOD PHOSPHATE PF 10 MG/ML IJ SOLN
10.0000 mg | Freq: Once | INTRAMUSCULAR | Status: AC
  Administered 2024-04-10: 10 mg via INTRAVENOUS
  Filled 2024-04-10: qty 1

## 2024-04-10 MED ORDER — AMISULPRIDE (ANTIEMETIC) 5 MG/2ML IV SOLN: 10.0000 mg | Freq: Once | INTRAVENOUS | Status: DC | PRN

## 2024-04-10 MED ORDER — FENTANYL CITRATE (PF) 100 MCG/2ML IJ SOLN: 25.0000 ug | INTRAMUSCULAR | Status: DC | PRN

## 2024-04-10 MED ORDER — FENTANYL CITRATE (PF) 100 MCG/2ML IJ SOLN
INTRAMUSCULAR | Status: AC
  Filled 2024-04-10: qty 2

## 2024-04-10 MED ORDER — ORAL CARE MOUTH RINSE: 15.0000 mL | Freq: Once | OROMUCOSAL | Status: AC

## 2024-04-10 MED ORDER — CHLORHEXIDINE GLUCONATE CLOTH 2 % EX PADS
6.0000 | MEDICATED_PAD | Freq: Once | CUTANEOUS | Status: AC
  Administered 2024-04-10: 6 via TOPICAL

## 2024-04-10 MED ORDER — DOXYCYCLINE HYCLATE 50 MG PO CAPS
50.0000 mg | ORAL_CAPSULE | Freq: Two times a day (BID) | ORAL | Status: DC
  Filled 2024-04-10 (×2): qty 1

## 2024-04-10 MED ORDER — IOHEXOL 300 MG/ML  SOLN: 150.0000 mL | Freq: Once | INTRAMUSCULAR | Status: DC | PRN

## 2024-04-10 MED ORDER — CHLORHEXIDINE GLUCONATE CLOTH 2 % EX PADS
6.0000 | MEDICATED_PAD | Freq: Every day | CUTANEOUS | Status: DC
  Administered 2024-04-10: 6 via TOPICAL

## 2024-04-10 MED ORDER — TICAGRELOR 90 MG PO TABS
90.0000 mg | ORAL_TABLET | Freq: Two times a day (BID) | ORAL | Status: DC
  Administered 2024-04-10 – 2024-04-11 (×2): 90 mg via ORAL
  Filled 2024-04-10 (×2): qty 1

## 2024-04-10 MED ORDER — ESCITALOPRAM OXALATE 10 MG PO TABS
20.0000 mg | ORAL_TABLET | Freq: Every day | ORAL | Status: DC
  Administered 2024-04-10: 20 mg via ORAL
  Filled 2024-04-10: qty 2

## 2024-04-10 MED ORDER — DEXAMETHASONE 4 MG PO TABS
6.0000 mg | ORAL_TABLET | Freq: Once | ORAL | Status: AC
  Administered 2024-04-10: 6 mg via ORAL
  Filled 2024-04-10: qty 2

## 2024-04-10 MED ORDER — OXYCODONE HCL 5 MG PO TABS: 5.0000 mg | ORAL_TABLET | Freq: Once | ORAL | Status: DC | PRN

## 2024-04-10 MED ORDER — SODIUM CHLORIDE 0.9 % IV SOLN: INTRAVENOUS | Status: DC

## 2024-04-10 MED ORDER — SUGAMMADEX SODIUM 200 MG/2ML IV SOLN
INTRAVENOUS | Status: DC | PRN
  Administered 2024-04-10: 200 mg via INTRAVENOUS

## 2024-04-10 MED ORDER — ONDANSETRON HCL 4 MG PO TABS: 4.0000 mg | ORAL_TABLET | ORAL | Status: DC | PRN

## 2024-04-10 MED ORDER — IOHEXOL 300 MG/ML  SOLN
150.0000 mL | Freq: Once | INTRAMUSCULAR | Status: AC | PRN
  Administered 2024-04-10: 50 mL via INTRA_ARTERIAL

## 2024-04-10 MED ORDER — FENTANYL CITRATE (PF) 250 MCG/5ML IJ SOLN
INTRAMUSCULAR | Status: DC | PRN
  Administered 2024-04-10: 100 ug via INTRAVENOUS

## 2024-04-10 MED ORDER — VANCOMYCIN HCL IN DEXTROSE 1-5 GM/200ML-% IV SOLN
1000.0000 mg | INTRAVENOUS | Status: AC
  Administered 2024-04-10: 1000 mg via INTRAVENOUS
  Filled 2024-04-10: qty 200

## 2024-04-10 MED ORDER — HYDROCODONE-ACETAMINOPHEN 5-325 MG PO TABS
1.0000 | ORAL_TABLET | ORAL | Status: DC | PRN
  Administered 2024-04-10 – 2024-04-11 (×2): 1 via ORAL
  Filled 2024-04-10 (×2): qty 1

## 2024-04-10 MED ORDER — ASPIRIN 81 MG PO CHEW
81.0000 mg | CHEWABLE_TABLET | Freq: Every day | ORAL | Status: DC
  Administered 2024-04-11: 81 mg via ORAL
  Filled 2024-04-10: qty 1

## 2024-04-10 MED ORDER — SENNOSIDES-DOCUSATE SODIUM 8.6-50 MG PO TABS: 1.0000 | ORAL_TABLET | Freq: Every evening | ORAL | Status: DC | PRN

## 2024-04-10 MED ORDER — HEPARIN SODIUM (PORCINE) 1000 UNIT/ML IJ SOLN
INTRAMUSCULAR | Status: DC | PRN
  Administered 2024-04-10: 5000 [IU] via INTRAVENOUS

## 2024-04-10 MED ORDER — LIDOCAINE 2% (20 MG/ML) 5 ML SYRINGE
INTRAMUSCULAR | Status: DC | PRN
  Administered 2024-04-10: 60 mg via INTRAVENOUS

## 2024-04-10 MED ORDER — LABETALOL HCL 5 MG/ML IV SOLN: 10.0000 mg | INTRAVENOUS | Status: DC | PRN

## 2024-04-10 MED ORDER — PANTOPRAZOLE SODIUM 40 MG IV SOLR
40.0000 mg | Freq: Every day | INTRAVENOUS | Status: DC
  Administered 2024-04-10: 40 mg via INTRAVENOUS
  Filled 2024-04-10: qty 10

## 2024-04-10 MED ORDER — MIDAZOLAM HCL 2 MG/2ML IJ SOLN
INTRAMUSCULAR | Status: AC
  Filled 2024-04-10: qty 2

## 2024-04-10 MED ORDER — EPHEDRINE SULFATE-NACL 50-0.9 MG/10ML-% IV SOSY
PREFILLED_SYRINGE | INTRAVENOUS | Status: DC | PRN
  Administered 2024-04-10 (×2): 10 mg via INTRAVENOUS

## 2024-04-10 MED ORDER — ONDANSETRON HCL 4 MG/2ML IJ SOLN: 4.0000 mg | INTRAMUSCULAR | Status: DC | PRN

## 2024-04-10 MED ORDER — CHLORHEXIDINE GLUCONATE 0.12 % MT SOLN
15.0000 mL | Freq: Once | OROMUCOSAL | Status: AC
  Administered 2024-04-10: 15 mL via OROMUCOSAL
  Filled 2024-04-10: qty 15

## 2024-04-10 MED ORDER — LACTATED RINGERS IV SOLN: INTRAVENOUS | Status: DC

## 2024-04-10 NOTE — Consult Note (Signed)
 "  NAME:  Kerry Chavez, MRN:  [id number], DOB:  02/28/1986, LOS: 0 ADMISSION DATE:  04/10/2024, CONSULTATION DATE:  1/16 REFERRING MD:  Lanis, CHIEF COMPLAINT:  post-op NS   History of Present Illness:  Kerry Chavez is a 39 y/o woman with a PMH aneurysmal SAH in 2023 who presented for pipeline embolization on 1/16. Her procedure was performed uneventfully and she was transferred to the ICU post-operatively.  She has some mild pain around her access site. She denies headache, weakness, CP, SOB.   Pertinent  Medical History  SAH with aneurysm in 2023 NSVT  Significant Hospital Events: Including procedures, antibiotic start and stop dates in addition to other pertinent events   1/16 pipeline embolizaiton  Interim History / Subjective:  Mild sore throat from being intubated   Objective    Blood pressure 95/61, pulse (!) 106, temperature 98.2 F (36.8 C), resp. rate (!) 21, height 5' 1 (1.549 m), weight 95.7 kg, SpO2 97%.        Intake/Output Summary (Last 24 hours) at 04/10/2024 1404 Last data filed at 04/10/2024 1343 Gross per 24 hour  Intake 1300 ml  Output 2225 ml  Net -925 ml   Filed Weights   04/10/24 0953  Weight: 95.7 kg    Examination: General: middle aged woman lying in bed in NAD HENT: /AT, eyes anicteric Lungs: breathing comfortably on RA, CTAB Cardiovascular: S1S2, RRR Abdomen: soft, NT Extremities: R femoral access site with minimal blood on bandage, no hematoma Neuro: awake, alert, moving all extremities. Normal speech, answering questions appropriately.   Pre-op labs reviewed H/ 9.7/32.1 UA: 0-5 WBC   Resolved problem list   Assessment and Plan   R carotid aneurysms, s/p pipeline embolization H/o aneurysmal SAH in 2023 -post-op care per NS -neuro checks per protocol -lie flat x 4 hours -goal SBP 120-140 -DAPT -pain control- hydrocodone , morphine  PRN -decadron  post-op  H/o anxiety and depression -con't PTA lexapro   Labs   CBC: Recent  Labs  Lab 04/06/24 0857  WBC 10.0  NEUTROABS 7.7  HGB 9.7*  HCT 32.1*  MCV 86.5  PLT 372    Basic Metabolic Panel: Recent Labs  Lab 04/06/24 0857  NA 138  K 4.1  CL 106  CO2 23  GLUCOSE 86  BUN 10  CREATININE 0.66  CALCIUM 9.0   GFR: Estimated Creatinine Clearance: 98.9 mL/min (by C-G formula based on SCr of 0.66 mg/dL). Recent Labs  Lab 04/06/24 0857  WBC 10.0    Liver Function Tests: No results for input(s): AST, ALT, ALKPHOS, BILITOT, PROT, ALBUMIN in the last 168 hours. No results for input(s): LIPASE, AMYLASE in the last 168 hours. No results for input(s): AMMONIA in the last 168 hours.  ABG No results found for: PHART, PCO2ART, PO2ART, HCO3, TCO2, ACIDBASEDEF, O2SAT   Coagulation Profile: Recent Labs  Lab 04/06/24 0857  INR 1.0    Cardiac Enzymes: No results for input(s): CKTOTAL, CKMB, CKMBINDEX, TROPONINI in the last 168 hours.  HbA1C: No results found for: HGBA1C  CBG: No results for input(s): GLUCAP in the last 168 hours.  Review of Systems:   Review of Systems  Constitutional:  Negative for chills and fever.  Respiratory:  Negative for cough and shortness of breath.   Cardiovascular:  Negative for chest pain and leg swelling.  Gastrointestinal: Negative.   Skin:  Negative for rash.     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   Surgical History:   Past Surgical History:  Procedure Laterality Date   CESAREAN SECTION     CESAREAN SECTION WITH BILATERAL TUBAL LIGATION Bilateral 06/12/2014   Procedure: Repeat CESAREAN SECTION WITH BILATERAL TUBAL LIGATION;  Surgeon: Dickie Carder, MD;  Location: [facility];  Service: Obstetrics;  Laterality:  Bilateral;  EDD: 06/19/14 Wants clear C-Section drape for this case   IR ANGIO INTRA EXTRACRAN SEL INTERNAL CAROTID BILAT MOD SED  01/10/2024   IR ANGIO INTRA EXTRACRAN SEL INTERNAL CAROTID UNI L MOD SED  12/21/2021   IR ANGIO INTRA EXTRACRAN SEL INTERNAL CAROTID UNI L MOD SED  02/14/2024   IR ANGIO INTRA EXTRACRAN SEL INTERNAL CAROTID UNI R MOD SED  12/21/2021   IR ANGIO INTRA EXTRACRAN SEL INTERNAL CAROTID UNI R MOD SED  06/26/2022   IR ANGIO VERTEBRAL SEL VERTEBRAL BILAT MOD SED  12/21/2021   IR ANGIO VERTEBRAL SEL VERTEBRAL BILAT MOD SED  01/10/2024   IR ANGIOGRAM FOLLOW UP STUDY  12/21/2021   IR ANGIOGRAM FOLLOW UP STUDY  12/21/2021   IR ANGIOGRAM FOLLOW UP STUDY  02/14/2024   IR NEURO EACH ADD'L AFTER BASIC UNI LEFT (MS)  02/18/2024   IR TRANSCATH/EMBOLIZ  12/21/2021   IR TRANSCATH/EMBOLIZ  02/14/2024   IR US  GUIDE VASC ACCESS RIGHT  06/26/2022   IR US  GUIDE VASC ACCESS RIGHT  01/10/2024   IR US  GUIDE VASC ACCESS RIGHT  02/14/2024   RADIOLOGY WITH ANESTHESIA N/A 12/21/2021   Procedure: IR WITH ANESTHESIA;  Surgeon: Lanis Pupa, MD;  Location: [facility] OR;  Service: Radiology;  Laterality: N/A;   RADIOLOGY WITH ANESTHESIA N/A 02/14/2024   Procedure: RADIOLOGY WITH ANESTHESIA;  Surgeon: Lanis Pupa, MD;  Location: [facility] OR;  Service: Radiology;  Laterality: N/A;  Pipeline embolization of left carotid aneurysm   TONSILLECTOMY AND ADENOIDECTOMY  1998     Social History:   reports that she quit smoking about 2 years ago. Her smoking use included cigarettes. She started smoking about 12 years ago. She has a 2.5 pack-year smoking history. She has been exposed to tobacco smoke. She has never used smokeless tobacco. She reports that she does not currently use alcohol. She reports that she does not currently use drugs after having used the following drugs: Marijuana.   Family History:  Her family history includes Breast cancer in her paternal aunt; CVA in her father; Cerebral aneurysm in her  father; Diabetes in her mother; Kidney disease in her brother.   Allergies Allergies[1]   Home Medications  Prior to Admission medications  Medication Sig Start Date End Date Taking? Authorizing Provider  acetaminophen  (TYLENOL ) 500 MG tablet Take 500-1,000 mg by mouth every 6 (six) hours as needed (pain.).   Yes [provider]  aspirin  81 MG chewable tablet Chew 81 mg by mouth in the morning. 02/05/24  Yes [provider]  benzoyl peroxide -erythromycin  (BENZAMYCIN) gel Apply to affected area 2 times daily 04/03/24 04/03/25 Yes Juberg, Lonni, DO  doxycycline  (VIBRAMYCIN ) 50 MG capsule Take 1 capsule (50 mg total) by mouth 2 (two) times daily for 14 days. 04/03/24 04/17/24 Yes Juberg, Christopher, DO  escitalopram  (LEXAPRO ) 20 MG tablet Take 1 tablet (20 mg total) by mouth at bedtime. 05/21/23  Yes Dove, Myra C, MD  Ferric Maltol  (ACCRUFER ) 30 MG CAPS Take 30 mg by mouth daily.   Yes [provider]  levonorgestrel  (MIRENA ) 20 MCG/DAY IUD 1  each by Intrauterine route once.   Yes [provider]  Multiple Vitamin (MULTIVITAMIN WITH MINERALS) TABS tablet Take 1 tablet by mouth daily.   Yes [provider]  ticagrelor  (BRILINTA ) 90 MG TABS tablet Take 90 mg by mouth 2 (two) times daily.   Yes [provider]  citalopram  (CELEXA ) 20 MG tablet Take 1 tablet (20 mg total) by mouth daily. 01/25/20 06/18/20  Chandra Toribio POUR, MD     Critical care time: n/a     Leita SHAUNNA Gaskins, DO 04/10/24 2:04 PM [facility]  For contact information, see Amion. If no response to pager, please call PCCM consult pager. After hours, 7PM- 7AM, please call Elink.          [1]  Allergies Allergen Reactions   Shellfish Allergy Swelling   Hydrocodone -Acetaminophen  Nausea And Vomiting    Patient states that it's only if she takes 2   Ivp Dye [Iodinated Contrast Media] Other (See Comments)    Unknown reaction   Misc. Sulfonamide  Containing Compounds    Penicillins Hives, Itching and Swelling    REACTION: rash   Sulfamethoxazole Hives and Swelling    REACTION: rash   Latex Itching and Rash    With condoms only   "

## 2024-04-10 NOTE — Anesthesia Postprocedure Evaluation (Signed)
"   Anesthesia Post Note  Patient: Kerry Chavez  Procedure(s) Performed: RADIOLOGY WITH ANESTHESIA     Patient location during evaluation: PACU Anesthesia Type: General Level of consciousness: awake and alert Pain management: pain level controlled Vital Signs Assessment: post-procedure vital signs reviewed and stable Respiratory status: spontaneous breathing, nonlabored ventilation and respiratory function stable Cardiovascular status: stable, blood pressure returned to baseline and tachycardic Anesthetic complications: no   No notable events documented.  Last Vitals:  Vitals:   04/10/24 1500 04/10/24 1600  BP: 92/60   Pulse: (!) 105   Resp: 20   Temp:  36.6 C  SpO2: 98%     Last Pain:  Vitals:   04/10/24 1600  TempSrc: Axillary  PainSc:                  Debby FORBES Like      "

## 2024-04-10 NOTE — Anesthesia Procedure Notes (Signed)
 Arterial Line Insertion Start/End1/16/2026 10:15 AM, 04/10/2024 10:22 AM Performed by: Lucious Debby BRAVO, MD, Savannaha Stonerock, Lonni PARAS, CRNA, CRNA  Preanesthetic checklist: patient identified, IV checked, site marked, risks and benefits discussed, surgical consent, monitors and equipment checked, pre-op evaluation, timeout performed and anesthesia consent Lidocaine  1% used for infiltration Left, radial was placed Catheter size: 20 G Hand hygiene performed  and Seldinger technique used Allen's test indicative of satisfactory collateral circulation Attempts: 1 Following insertion, dressing applied (CHG infused dressing). Post procedure assessment: normal  Patient tolerated the procedure well with no immediate complications.

## 2024-04-10 NOTE — Progress Notes (Addendum)
 Patient transferred from PACU to 4N ~ 1400.  Patient is alert and oriented.  Mild shadowing noted on groin site.  Pulses intact.  CCM notified of admission.    1445:  Patient's groin site bleeding.  Pressure held and retaped at 1500.

## 2024-04-10 NOTE — Transfer of Care (Signed)
 Immediate Anesthesia Transfer of Care Note  Patient: Kerry Chavez  Procedure(s) Performed: RADIOLOGY WITH ANESTHESIA  Patient Location: PACU  Anesthesia Type:General  Level of Consciousness: awake, alert , oriented, and patient cooperative  Airway & Oxygen Therapy: Patient Spontanous Breathing  Post-op Assessment: Report given to RN and Post -op Vital signs reviewed and stable  Post vital signs: Reviewed and stable  Last Vitals:  Vitals Value Taken Time  BP 86/60 04/10/24 13:00  Temp 36.8 C 04/10/24 13:00  Pulse 109 04/10/24 13:01  Resp 21 04/10/24 13:04  SpO2 93 % 04/10/24 13:01  Vitals shown include unfiled device data.  Last Pain:  Vitals:   04/10/24 1003  TempSrc:   PainSc: 4       Patients Stated Pain Goal: 3 (04/10/24 1003)  Complications: No notable events documented.

## 2024-04-10 NOTE — H&P (Addendum)
 "  HPI:     Kerry Chavez is a 39 year old woman I am seeing after undergoing pipeline embolization of an enlarging left carotid aneurysm.  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  More recent angiogram has demonstrated enlargement of both left and right-sided aneurysms. She underwent Pipeline embolization of the left-sided aneurysms about 6 weeks ago. She remains on daily aspirin  and twice Daily Brilinta .  She does not report any new headaches or visual changes and presents today for treatment of the enlarging right-sided aneurysms.   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 02/14/2024   Skin tag 12/03/2022   Menorrhagia 11/08/2022   Visit for routine gyn exam 11/08/2022   Possible exposure to STD 11/08/2022   Current severe episode of major depressive disorder without psychotic features without prior episode (HCC) 10/16/2022   Restless leg syndrome 09/04/2022   NSVT (nonsustained ventricular tachycardia) (HCC) 09/03/2022   Precordial pain 07/24/2022   Abnormal uterine bleeding (AUB) 06/04/2022   Healthcare maintenance 06/04/2022   Headache 05/06/2022   Iron deficiency 02/27/2022   Palpitations 12/13/2020   Anxiety 12/13/2020   Vitamin D  deficiency 06/23/2020   Morbid obesity with BMI of 40.0-44.9, adult (HCC) 11/10/2007   Past Medical History:  Diagnosis Date   Anemia    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HCC) 11/2021   Anxiety    Asthma    Bleeding external hemorrhoids 06/04/2022   Depression    NSVT (nonsustained ventricular tachycardia) (HCC) 09/03/2022   Stroke (HCC) 12/13/2021    Past Surgical History:  Procedure Laterality Date   CESAREAN SECTION     CESAREAN SECTION WITH BILATERAL TUBAL LIGATION Bilateral 06/12/2014    Procedure: Repeat CESAREAN SECTION WITH BILATERAL TUBAL LIGATION;  Surgeon: Dickie Carder, MD;  Location: [facility];  Service: Obstetrics;  Laterality: Bilateral;  EDD: 06/19/14 Wants clear C-Section drape for this case   IR ANGIO INTRA EXTRACRAN SEL INTERNAL CAROTID BILAT MOD SED  01/10/2024   IR ANGIO INTRA EXTRACRAN SEL INTERNAL CAROTID UNI L MOD SED  12/21/2021   IR ANGIO INTRA EXTRACRAN SEL INTERNAL CAROTID UNI L MOD SED  02/14/2024   IR ANGIO INTRA EXTRACRAN SEL INTERNAL CAROTID UNI R MOD SED  12/21/2021   IR ANGIO INTRA EXTRACRAN SEL INTERNAL CAROTID UNI R MOD SED  06/26/2022   IR ANGIO VERTEBRAL SEL VERTEBRAL BILAT MOD SED  12/21/2021   IR ANGIO VERTEBRAL SEL VERTEBRAL BILAT MOD SED  01/10/2024   IR ANGIOGRAM FOLLOW UP STUDY  12/21/2021   IR ANGIOGRAM FOLLOW UP STUDY  12/21/2021   IR ANGIOGRAM FOLLOW UP STUDY  02/14/2024   IR NEURO EACH ADD'L AFTER BASIC UNI LEFT (MS)  02/18/2024   IR TRANSCATH/EMBOLIZ  12/21/2021   IR TRANSCATH/EMBOLIZ  02/14/2024   IR US  GUIDE VASC ACCESS RIGHT  06/26/2022   IR US  GUIDE VASC ACCESS RIGHT  01/10/2024   IR US  GUIDE VASC ACCESS RIGHT  02/14/2024   RADIOLOGY WITH ANESTHESIA N/A 12/21/2021   Procedure: IR WITH ANESTHESIA;  Surgeon: Lanis Pupa, MD;  Location: [facility] OR;  Service: Radiology;  Laterality: N/A;   RADIOLOGY WITH ANESTHESIA N/A 02/14/2024   Procedure: RADIOLOGY WITH ANESTHESIA;  Surgeon: Lanis Pupa, MD;  Location: [facility] OR;  Service: Radiology;  Laterality: N/A;  Pipeline embolization of left carotid aneurysm   TONSILLECTOMY AND  ADENOIDECTOMY  1998    No medications prior to admission.   Allergies[1]  Social History   Tobacco Use   Smoking status: Former    Current packs/day: 0.00    Average packs/day: 0.3 packs/day for 10.0 years (2.5 ttl pk-yrs)    Types: Cigarettes    Start date: 12/21/2011    Quit date: 12/20/2021    Years since quitting: 2.3    Passive exposure: Past   Smokeless tobacco: Never  Substance Use Topics   Alcohol  use: Not Currently    Comment: Socially    Family History  Problem Relation Age of Onset   Diabetes Mother    Cerebral aneurysm Father    CVA Father    Kidney disease Brother    Breast cancer Paternal Aunt      Objective:   No data found. No intake/output data recorded. No intake/output data recorded.  Awake, alert, oriented Speech fluent, appropriate CN grossly intact 5/5 BUE/BLE    Assessment:   39 year old woman with remote history of subarachnoid hemorrhage now status post pipeline embolization of enlarging left-sided carotid aneurysms, doing well.  She does also have enlarging right-sided aneurysms which also require treatment.  Comments:  Pt already on ASA/Brilinta    Plan:   - we will plan on proceeding with pipeline embolization of the enlarging right-sided aneurysms.   I have reviewed the indications for the procedure as well as the details of the procedure and the expected postoperative course and recovery at length with the patient in the office. We have also reviewed in detail the risks, benefits, and alternatives to the procedure. All questions were answered and Kerry Chavez provided informed consent to proceed.  Kerry Maizes, MD [facility]        [1]  Allergies Allergen Reactions   Shellfish Allergy Swelling   Hydrocodone -Acetaminophen  Nausea And Vomiting    Patient states that it's only if she takes 2   Ivp Dye [Iodinated Contrast Media] Other (See Comments)    Unknown reaction   Misc. Sulfonamide Containing Compounds    Penicillins Hives, Itching and Swelling    REACTION: rash   Sulfamethoxazole Hives and Swelling    REACTION: rash   Latex Itching and Rash    With condoms only   "

## 2024-04-10 NOTE — Op Note (Signed)
 " ENDOVASCULAR NEUROSURGERY OPERATIVE NOTE   PROCEDURE: Pipeline embolization of right internal carotid artery aneurysms  OPERATOR:    Dr. Gerldine Maizes, MD  HISTORY:    The patient is a 39 y.o. 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  Follow-up angiograms have revealed enlargement of the recanalized posterior communicating artery aneurysm as well as a right ophthalmic aneurysm.  Furthermore, patient was noted to have enlargement of incidentally discovered left-sided carotid aneurysms for which she underwent pipeline embolization about 6 weeks ago.  She has done well from a neurologic standpoint, remains on daily aspirin  and twice daily Brilinta .  She presents today for pipeline embolization of the right sided aneurysms.  APPROACH:    The technical aspects of the procedure as well as its potential risks and benefits were reviewed with the patient. These risks included but were not limited bleeding, infection, allergic reaction, damage to organs/vital structures, stroke, non-diagnostic procedure, and the catastrophic outcomes of heart attack, coma, and death. With an understanding of these risks, informed consent was obtained and witnessed.    The patient was placed in the supine position on the angiography table and the skin of right groin prepped in the usual sterile fashion. The procedure was performed under general anesthesia.  A 8- French sheath was introduced in the right common femoral artery using Seldinger technique.  A fluorophase sequence was used to document the sheath position.    HEPARIN :  5000 Units total.     CONTRAST AGENT:  See IR records    FLUOROSCOPY TIME:  See IR records   CATHETER(S) AND WIRE(S):     0.035 glidewire   80 cm 6 French NeuronMax guide sheath 125 cm 6 French Berenstein select guide catheter 115 cm 058 Cat 5 guide catheter 150 cm  Phenom 27 microcatheter Synchro 2 select standard microwire  VESSELS CATHETERIZED:    Right internal carotid artery Right common femoral  VESSELS STUDIED:    Right internal carotid artery, pre-embolization Right internal carotid artery, during embolization Right internal carotid artery, post embolization Right internal carotid artery, final control  PIPELINE DEVICE USED (MRI COMPATIBLE): Pipeline Flex with Shield 3.25 x 25mm  PROCEDURAL NARRATIVE:   The guide sheath was introduced over the select guide catheter and 035 wire. The right internal carotid artery was then selected, and the guide sheath was advanced into the proximal cervical RICA. The select catheter was then removed without incident. The guide catheter was then introduced over the microcatheter and Synchro standard microwire. The microcatheter was used to select the distal RICA and the catalyst guide catheter was then advanced to its final position in the cavernous segment. The microcatheter was then advanced into the distal right M1 segment. Microwire was removed and the above Pipeline device introduced. Utilizing live fluoro and single shots, the device was carefully deployed across the aneurysm. The pusher wire was then recaptured and removed. The microcatheter was then removed without incident. The guide catheter and sheath were then withdrawn into the distal cervical internal carotid artery, and final control angiogram was taken. The entire catheter construct was then removed synchronously without incident.  INTERPRETATION:    Right internal carotid artery, pre-embolization: Injection demonstrates patency of the internal carotid artery, with normal bifurcation into anterior and middle cerebral arteries.  Coil mass is again identified with neck recanalization as described previously.  Also again seen is the medially projecting ophthalmic aneurysm.  Capillary phases unremarkable.  Venous phases normal.  Right internal carotid,  during embolization: Injections taken during deployment of the pipeline device demonstrate coverage of both aneurysms with the pipeline stent.  There remains good vessel wall apposition.  No filling defects are noted.  Right internal carotid, post-embolization: Post-deployment angiograms taken immediately and a delayed fashion reveal widely patent carotid artery. Pipeline device is widely patent with stable position and good vessel wall apposition.  The device extends from the posterior genu of the cavernous segment into the supraclinoid carotid, just distal to the aneurysm neck.  No filling defects are seen.  There is perhaps mild contrast stasis within the ophthalmic aneurysm, no significant stasis noted within the recanalized neck of the posterior communicating aneurysm.  Right internal carotid artery, final control: Injection demonstrates patency of the internal carotid artery, with normal bifurcation. The distal branches of the anterior cerebral and middle cerebral arteries are normal. Pipeline device is stable, without any filling defects seen. Capillary phase does not demonstrate any perfusion deficits. Venous phases is unremarkable.  Right femoral:     Normal vessel. No significant atherosclerotic disease. Arterial sheath in adequate position, with the 8 French sheath noted to be nearly occlusive within the right common femoral artery.  DISPOSITION:   Upon completion of the study, the femoral sheath was removed and hemostasis obtained using a 8-Fr Angio-Seal closure device. Good proximal and distal lower extremity pulses were documented upon achievement of hemostasis. The procedure was well tolerated and no early complications were observed.   The patient was extubated and taken to the postanesthesia care unit in stable hemodynamic condition.  IMPRESSION:   1. Successful Pipeline embolization of a right ophthalmic and recanalized posterior communicating artery aneurysms without local  thrombotic or distal embolic complication.   Gerldine Maizes, MD [facility]   "

## 2024-04-10 NOTE — Progress Notes (Signed)
 eLink Physician-Brief Progress Note Patient Name: Kerry Chavez DOB: May 19, 1985 MRN: [id number]   Date of Service  04/10/2024  HPI/Events of Note  Patient declined doxycycline . Self reports this is for acne  eICU Interventions  Will discontinue     Intervention Category Minor Interventions: Routine modifications to care plan (e.g. PRN medications for pain, fever)  Roby Donaway Slater Staff 04/10/2024, 11:17 PM

## 2024-04-10 NOTE — Addendum Note (Signed)
 Encounter addended by: Nada Ozell CROME on: 04/10/2024 10:54 AM  Actions taken: Imaging Exam ended

## 2024-04-10 NOTE — Anesthesia Procedure Notes (Signed)
 Procedure Name: Intubation Date/Time: 04/10/2024 11:21 AM  Performed by: Marva Lonni PARAS, CRNAPre-anesthesia Checklist: Patient identified, Emergency Drugs available, Suction available and Patient being monitored Patient Re-evaluated:Patient Re-evaluated prior to induction Oxygen Delivery Method: Circle System Utilized Preoxygenation: Pre-oxygenation with 100% oxygen Induction Type: IV induction Ventilation: Mask ventilation without difficulty Laryngoscope Size: Mac and 3 Grade View: Grade II Tube type: Oral Tube size: 7.0 mm Number of attempts: 1 Airway Equipment and Method: Stylet Placement Confirmation: ETT inserted through vocal cords under direct vision, positive ETCO2 and breath sounds checked- equal and bilateral Secured at: 22 cm Tube secured with: Tape Dental Injury: Teeth and Oropharynx as per pre-operative assessment

## 2024-04-11 DIAGNOSIS — Z8659 Personal history of other mental and behavioral disorders: Secondary | ICD-10-CM | POA: Diagnosis not present

## 2024-04-11 DIAGNOSIS — Z8679 Personal history of other diseases of the circulatory system: Secondary | ICD-10-CM | POA: Diagnosis not present

## 2024-04-11 DIAGNOSIS — I720 Aneurysm of carotid artery: Secondary | ICD-10-CM | POA: Diagnosis not present

## 2024-04-11 MED ORDER — HYDROCODONE-ACETAMINOPHEN 5-325 MG PO TABS: 1.0000 | ORAL_TABLET | ORAL | 0 refills | Status: AC | PRN

## 2024-04-11 NOTE — Consult Note (Signed)
" ° °  NAME:  Kerry Chavez, MRN:  [id number], DOB:  12-25-1985, LOS: 1 ADMISSION DATE:  04/10/2024, CONSULTATION DATE:  1/16 REFERRING MD:  Lanis, CHIEF COMPLAINT:  post-op NS   History of Present Illness:  Kerry Chavez is a 39 y/o woman with a PMH aneurysmal SAH in 2023 who presented for pipeline embolization on 1/16. Her procedure was performed uneventfully and she was transferred to the ICU post-operatively.  She has some mild pain around her access site. She denies headache, weakness, CP, SOB.   Pertinent  Medical History  SAH with aneurysm in 2023 NSVT  Significant Hospital Events: Including procedures, antibiotic start and stop dates in addition to other pertinent events   1/16 pipeline embolizaiton  Interim History / Subjective:  No nausea, pain controlled, has not walked yet. Slept ok. No significant pain in her leg.   Objective    Blood pressure 96/65, pulse 78, temperature 98.5 F (36.9 C), temperature source Axillary, resp. rate 17, height 5' 1 (1.549 m), weight 95.7 kg, SpO2 97%.        Intake/Output Summary (Last 24 hours) at 04/11/2024 0854 Last data filed at 04/11/2024 0800 Gross per 24 hour  Intake 2745.94 ml  Output 3775 ml  Net -1029.06 ml   Filed Weights   04/10/24 0953  Weight: 95.7 kg    Examination: General: middle aged woman lying in NAD HENT:  [place]/AT, eyes anicteric Lungs: breathing comfortably on RA, CTAB Cardiovascular: S1S2, RRR Abdomen: soft, NT Extremities: R femoral access site dressing c/d/I, no hematoma.  Neuro: Awake and alert, answering questions appropriately, normal speech, moving all extremities symmetrically.    Resolved problem list   Assessment and Plan   R carotid aneurysms, s/p pipeline embolization H/o aneurysmal SAH in 2023 -Postop care per neurosurgery - needs to walk; otherwise planning on d/c today -Pain control per neurosurgery -DAPT  H/o anxiety and depression -Continue PTA Lexapro   Sore throat from  intubation -Since this has been present since immediately postop I reassured her that this is most likely from endotracheal intubation and not her shellfish allergy.  Stable for discharge.  Critical care time: n/a     Leita SHAUNNA Gaskins, DO 04/11/24 9:00 AM [facility]  For contact information, see Amion. If no response to pager, please call PCCM consult pager. After hours, 7PM- 7AM, please call Elink.        "

## 2024-04-11 NOTE — Discharge Summary (Signed)
 " Physician Discharge Summary  Patient ID: Kerry Chavez MRN: [id number] DOB/AGE: 04-05-1985 39 y.o.  Admit date: 04/10/2024 Discharge date: 04/11/2024  Admission Diagnoses:  aneurysm  Discharge Diagnoses:  Same Principal Problem:   Cerebral aneurysm   Discharged Condition: Stable  Hospital Course:  Kerry Chavez is a 39 y.o. female w/ hx SAH, left carotid pipeline embolization who is status post right sided carotid pipeline embolization for the treatment of known aneurysms. Postoperatively, the patient was mobilized with the help PT, pain was well-controlled with oral meds. Patient reported improved ambulation ability postop. Patient was deemed ready for discharge.   Treatments: Surgery -right internal carotid artery pipeline embolization  Discharge Exam: Blood pressure 96/65, pulse 78, temperature 98.5 F (36.9 C), temperature source Axillary, resp. rate 17, height 5' 1 (1.549 m), weight 95.7 kg, SpO2 97%. Awake, alert, oriented Speech fluent, appropriate CN grossly intact 5/5 BUE/BLE Wound c/d/I No evidence of hematoma in the right groin  Disposition: Discharge disposition: 01-Home or Self Care       Discharge Instructions     Discharge wound care:   Complete by: As directed    Remove dressing 24 hours after it was placed   Increase activity slowly   Complete by: As directed       Allergies as of 04/11/2024       Reactions   Shellfish Allergy Swelling   Hydrocodone -acetaminophen  Nausea And Vomiting   Patient states that it's only if she takes 2   Ivp Dye [iodinated Contrast Media] Other (See Comments)   Unknown reaction   Misc. Sulfonamide Containing Compounds    Penicillins Hives, Itching, Swelling   REACTION: rash   Sulfamethoxazole Hives, Swelling   REACTION: rash   Latex Itching, Rash   With condoms only        Medication List     TAKE these medications    ACCRUFeR  30 MG Caps Generic drug: Ferric Maltol  Take 30 mg by mouth daily.    acetaminophen  500 MG tablet Commonly known as: TYLENOL  Take 500-1,000 mg by mouth every 6 (six) hours as needed (pain.).   aspirin  81 MG chewable tablet Chew 81 mg by mouth in the morning.   benzoyl peroxide -erythromycin  gel Commonly known as: Benzamycin Apply to affected area 2 times daily   doxycycline  50 MG capsule Commonly known as: VIBRAMYCIN  Take 1 capsule (50 mg total) by mouth 2 (two) times daily for 14 days.   escitalopram  20 MG tablet Commonly known as: Lexapro  Take 1 tablet (20 mg total) by mouth at bedtime.   HYDROcodone -acetaminophen  5-325 MG tablet Commonly known as: NORCO/VICODIN Take 1 tablet by mouth every 4 (four) hours as needed for moderate pain (pain score 4-6).   levonorgestrel  20 MCG/DAY Iud Commonly known as: MIRENA  1 each by Intrauterine route once.   multivitamin with minerals Tabs tablet Take 1 tablet by mouth daily.   ticagrelor  90 MG Tabs tablet Commonly known as: BRILINTA  Take 90 mg by mouth 2 (two) times daily.               Discharge Care Instructions  (From admission, onward)           Start     Ordered   04/11/24 0000  Discharge wound care:       Comments: Remove dressing 24 hours after it was placed   04/11/24 0909            Follow-up Information     Kerry Pupa, MD Follow up.   Specialty:  Neurosurgery Contact information: [address] [place] [address] [phone]                 Signed: Dorn Chavez Glade 04/11/2024, 9:09 AM  "

## 2024-04-13 ENCOUNTER — Encounter (HOSPITAL_COMMUNITY): Payer: Self-pay | Admitting: Neurosurgery

## 2024-04-13 ENCOUNTER — Telehealth: Payer: Self-pay

## 2024-04-13 NOTE — Transitions of Care (Post Inpatient/ED Visit) (Signed)
" ° °  04/13/2024  Name: Kerry Chavez MRN: [id number] DOB: 08-Apr-1985  Today's TOC FU Call Status: Today's TOC FU Call Status:: Unsuccessful Call (1st Attempt) Unsuccessful Call (1st Attempt) Date: 04/13/24  Attempted to reach the patient regarding the most recent Inpatient/ED visit.  Follow Up Plan: Additional outreach attempts will be made to reach the patient to complete the Transitions of Care (Post Inpatient/ED visit) call.   Medford Balboa, BSN, RN [place]  [facility] - Population Health RN Care Manager [phone]  "

## 2024-04-14 ENCOUNTER — Telehealth: Payer: Self-pay

## 2024-04-14 NOTE — Transitions of Care (Post Inpatient/ED Visit) (Signed)
" ° °  04/14/2024  Name: Falicia Lizotte MRN: [id number] DOB: 1985/07/14  Today's TOC FU Call Status: Today's TOC FU Call Status:: Unsuccessful Call (2nd Attempt) Unsuccessful Call (2nd Attempt) Date: 04/14/24  Attempted to reach the patient regarding the most recent Inpatient/ED visit.  Follow Up Plan: Additional outreach attempts will be made to reach the patient to complete the Transitions of Care (Post Inpatient/ED visit) call.   Shona Prow RN, CCM [place]  VBC[facility]-Population Health RN Care Manager [phone]  "

## 2024-04-16 ENCOUNTER — Telehealth: Payer: Self-pay

## 2024-04-16 NOTE — Transitions of Care (Post Inpatient/ED Visit) (Signed)
" ° °  04/16/2024  Name: Kerry Chavez MRN: [id number] DOB: 12/04/1985  Today's TOC FU Call Status: Today's TOC FU Call Status:: Unsuccessful Call (3rd Attempt) Unsuccessful Call (1st Attempt) Date: 04/13/24 Unsuccessful Call (2nd Attempt) Date: 04/14/24 Unsuccessful Call (3rd Attempt) Date: 04/16/24  Attempted to reach the patient regarding the most recent Inpatient/ED visit.  Follow Up Plan: No further outreach attempts will be made at this time. We have been unable to contact the patient.  Medford Balboa, BSN, RN [place]  [facility]BC[facility] - Population Health RN Care Manager [phone]  "
